# Patient Record
Sex: Male | Born: 1955
Health system: Southern US, Community
[De-identification: ages and names within clinical notes are randomized; demographics above are authoritative.]

## PROBLEM LIST (undated history)

## (undated) ENCOUNTER — Emergency Department (HOSPITAL_BASED_OUTPATIENT_CLINIC_OR_DEPARTMENT_OTHER): Admission: EM | Payer: BC Managed Care – PPO

## (undated) DIAGNOSIS — G4700 Insomnia, unspecified: Secondary | ICD-10-CM

## (undated) DIAGNOSIS — M25551 Pain in right hip: Secondary | ICD-10-CM

## (undated) DIAGNOSIS — R351 Nocturia: Secondary | ICD-10-CM

## (undated) DIAGNOSIS — E7800 Pure hypercholesterolemia, unspecified: Secondary | ICD-10-CM

## (undated) DIAGNOSIS — N400 Enlarged prostate without lower urinary tract symptoms: Secondary | ICD-10-CM

## (undated) DIAGNOSIS — F609 Personality disorder, unspecified: Secondary | ICD-10-CM

## (undated) DIAGNOSIS — M25519 Pain in unspecified shoulder: Secondary | ICD-10-CM

## (undated) DIAGNOSIS — I700 Atherosclerosis of aorta: Secondary | ICD-10-CM

## (undated) DIAGNOSIS — K760 Fatty (change of) liver, not elsewhere classified: Secondary | ICD-10-CM

## (undated) DIAGNOSIS — N281 Cyst of kidney, acquired: Secondary | ICD-10-CM

## (undated) DIAGNOSIS — F0390 Unspecified dementia without behavioral disturbance: Secondary | ICD-10-CM

## (undated) DIAGNOSIS — Z973 Presence of spectacles and contact lenses: Secondary | ICD-10-CM

## (undated) HISTORY — DX: Pure hypercholesterolemia, unspecified: E78.00

## (undated) HISTORY — DX: Cyst of kidney, acquired: N28.1

## (undated) HISTORY — DX: Pain in right hip: M25.551

## (undated) HISTORY — PX: SHOULDER SURGERY: SHX246

## (undated) HISTORY — DX: Personality disorder, unspecified: F60.9

## (undated) HISTORY — DX: Pain in unspecified shoulder: M25.519

## (undated) HISTORY — DX: Fatty (change of) liver, not elsewhere classified: K76.0

## (undated) HISTORY — DX: Insomnia, unspecified: G47.00

## (undated) HISTORY — PX: TONSILLECTOMY: SUR1361

## (undated) HISTORY — DX: Unspecified dementia, unspecified severity, without behavioral disturbance, psychotic disturbance, mood disturbance, and anxiety: F03.90

## (undated) HISTORY — DX: Atherosclerosis of aorta: I70.0

---

## 1999-09-08 ENCOUNTER — Ambulatory Visit (HOSPITAL_COMMUNITY): Admission: RE | Admit: 1999-09-08 | Discharge: 1999-09-08 | Payer: Self-pay | Admitting: Internal Medicine

## 1999-09-08 ENCOUNTER — Encounter: Payer: Self-pay | Admitting: Internal Medicine

## 2006-07-31 ENCOUNTER — Encounter: Admission: RE | Admit: 2006-07-31 | Discharge: 2006-07-31 | Payer: Self-pay | Admitting: Family Medicine

## 2010-04-10 ENCOUNTER — Observation Stay (HOSPITAL_COMMUNITY)
Admission: EM | Admit: 2010-04-10 | Discharge: 2010-04-12 | DRG: 248 | Disposition: A | Discharge status: Home / Self Care | Payer: BC Managed Care – PPO | Attending: Internal Medicine | Admitting: Internal Medicine

## 2010-04-10 ENCOUNTER — Emergency Department (HOSPITAL_COMMUNITY): Payer: BC Managed Care – PPO

## 2010-04-10 DIAGNOSIS — M19019 Primary osteoarthritis, unspecified shoulder: Secondary | ICD-10-CM | POA: Insufficient documentation

## 2010-04-10 DIAGNOSIS — M25519 Pain in unspecified shoulder: Secondary | ICD-10-CM | POA: Insufficient documentation

## 2010-04-10 DIAGNOSIS — R0602 Shortness of breath: Secondary | ICD-10-CM | POA: Insufficient documentation

## 2010-04-10 DIAGNOSIS — R0789 Other chest pain: Secondary | ICD-10-CM | POA: Diagnosis present

## 2010-04-10 DIAGNOSIS — R209 Unspecified disturbances of skin sensation: Secondary | ICD-10-CM | POA: Insufficient documentation

## 2010-04-10 DIAGNOSIS — M7530 Calcific tendinitis of unspecified shoulder: Principal | ICD-10-CM | POA: Diagnosis present

## 2010-04-10 DIAGNOSIS — M542 Cervicalgia: Secondary | ICD-10-CM | POA: Insufficient documentation

## 2010-04-10 DIAGNOSIS — E781 Pure hyperglyceridemia: Secondary | ICD-10-CM | POA: Diagnosis present

## 2010-04-10 DIAGNOSIS — R29898 Other symptoms and signs involving the musculoskeletal system: Secondary | ICD-10-CM | POA: Insufficient documentation

## 2010-04-10 DIAGNOSIS — M79609 Pain in unspecified limb: Secondary | ICD-10-CM | POA: Insufficient documentation

## 2010-04-10 LAB — CBC
HCT: 40.7 % (ref 39.0–52.0)
Hemoglobin: 14.1 g/dL (ref 13.0–17.0)
MCV: 87.5 fL (ref 78.0–100.0)
RBC: 4.65 MIL/uL (ref 4.22–5.81)
RDW: 13 % (ref 11.5–15.5)
WBC: 11 10*3/uL — ABNORMAL HIGH (ref 4.0–10.5)

## 2010-04-10 LAB — POCT I-STAT, CHEM 8
BUN: 10 mg/dL (ref 6–23)
Calcium, Ion: 1.13 mmol/L (ref 1.12–1.32)
Chloride: 104 mEq/L (ref 96–112)
Creatinine, Ser: 0.9 mg/dL (ref 0.4–1.5)
Glucose, Bld: 97 mg/dL (ref 70–99)
Potassium: 3.8 mEq/L (ref 3.5–5.1)

## 2010-04-10 LAB — POCT CARDIAC MARKERS
CKMB, poc: <1 ng/mL — ABNORMAL LOW (ref 1.0–8.0)
CKMB, poc: <1 ng/mL — ABNORMAL LOW (ref 1.0–8.0)
Troponin i, poc: <0.05 ng/mL (ref 0.00–0.09)
Troponin i, poc: <0.05 ng/mL (ref 0.00–0.09)

## 2010-04-10 LAB — DIFFERENTIAL
Eosinophils Relative: 2 % (ref 0–5)
Lymphocytes Relative: 33 % (ref 12–46)
Lymphs Abs: 3.6 10*3/uL (ref 0.7–4.0)
Neutro Abs: 6.3 10*3/uL (ref 1.7–7.7)

## 2010-04-11 ENCOUNTER — Inpatient Hospital Stay (HOSPITAL_COMMUNITY): Payer: BC Managed Care – PPO

## 2010-04-11 LAB — LIPID PANEL
Cholesterol: 158 mg/dL (ref 0–200)
LDL Cholesterol: 82 mg/dL (ref 0–99)
Triglycerides: 194 mg/dL — ABNORMAL HIGH (ref <150)
VLDL: 39 mg/dL (ref 0–40)

## 2010-04-11 LAB — CBC
HCT: 40.7 % (ref 39.0–52.0)
Hemoglobin: 13.8 g/dL (ref 13.0–17.0)
MCH: 30.1 pg (ref 26.0–34.0)
MCHC: 33.9 g/dL (ref 30.0–36.0)
RBC: 4.58 MIL/uL (ref 4.22–5.81)

## 2010-04-11 LAB — CARDIAC PANEL(CRET KIN+CKTOT+MB+TROPI)
CK, MB: 1.1 ng/mL (ref 0.3–4.0)
Relative Index: INVALID (ref 0.0–2.5)
Total CK: 77 U/L (ref 7–232)
Total CK: 65 U/L (ref 7–232)

## 2010-04-11 LAB — BASIC METABOLIC PANEL
CO2: 26 mEq/L (ref 19–32)
Calcium: 8.7 mg/dL (ref 8.4–10.5)
Chloride: 106 mEq/L (ref 96–112)
Creatinine, Ser: 0.79 mg/dL (ref 0.4–1.5)
GFR calc Af Amer: >60 mL/min (ref >60)
Glucose, Bld: 115 mg/dL — ABNORMAL HIGH (ref 70–99)
Sodium: 138 mEq/L (ref 135–145)

## 2010-04-12 ENCOUNTER — Inpatient Hospital Stay (HOSPITAL_COMMUNITY): Payer: 59

## 2010-04-12 ENCOUNTER — Inpatient Hospital Stay (HOSPITAL_COMMUNITY): Payer: BC Managed Care – PPO

## 2010-04-12 LAB — COMPREHENSIVE METABOLIC PANEL
ALT: 21 U/L (ref 0–53)
AST: 18 U/L (ref 0–37)
Albumin: 4.1 g/dL (ref 3.5–5.2)
Alkaline Phosphatase: 92 U/L (ref 39–117)
Calcium: 9 mg/dL (ref 8.4–10.5)
GFR calc Af Amer: >60 mL/min (ref >60)
Potassium: 4 mEq/L (ref 3.5–5.1)
Sodium: 135 mEq/L (ref 135–145)
Total Protein: 7.1 g/dL (ref 6.0–8.3)

## 2010-04-16 NOTE — Discharge Summary (Signed)
[name], [name]         ACCOUNT NO.:  [id number]  MEDICAL RECORD NO.:  [id number]           PATIENT TYPE:  I  LOCATION:  1430                         FACILITY:  [facility]  PHYSICIAN:  Hillery Aldo, M.D.   DATE OF BIRTH:  07-29-1955  DATE OF ADMISSION:  04/10/2010 DATE OF DISCHARGE:  04/12/2010                              DISCHARGE SUMMARY   PRIMARY CARE PHYSICIAN:  L. Lupe Carney, M.D.  DISCHARGE DIAGNOSES: 1. Left shoulder, forearm, and chest pain, musculoskeletal in origin. 2. Hypertriglyceridemia. 3. Calcific rotator cuff tendinopathy.  DISCHARGE MEDICATIONS: 1. Motrin 600 mg p.o. q.6 h. p.r.n. pain. 2. Oxycodone 5-10 mg p.o. q.4 h. p.r.n. pain. 3. Extra strength Tylenol 1000 mg p.o. q.6 h. p.r.n. pain. 4. Fish oil 2 g p.o. b.i.d.  CONSULTATIONS:  None.  BRIEF ADMISSION HISTORY OF PRESENT ILLNESS:  The patient is a 55 year old male who presented to the hospital with a 1-week history of left shoulder, forearm, and chest pain.  Pain began after experiencing a popping sensation in his axilla.  After the popping sensation, the patient reported pain and numbness in the left arm.  He went to see his primary care physician who started him on prednisone, but despite therapy with prednisone, the pain has not improved.  Because of concurrent complaints of left-sided chest pain, he was referred to the Hospitalist Service for further evaluation and treatment.  For full details, please see the dictated report done by Dr. Mikeal Hawthorne.  PROCEDURES AND DIAGNOSTIC STUDIES: 1. C-spine films on April 10, 2010, showed no acute osseous     abnormality.  Mild multilevel degenerative disk disease. 2. Chest x-ray on April 10, 2010, showed no focal acute     abnormality. 3. MRI of the left shoulder:  Normal.  Probable benign bone island in     the proximal left humeral shaft.  Radiographs of the left humerus     recommended for further evaluation. 4. Left humeral films on  April 12, 2010, showed no acute     abnormality.  Calcific rotator cuff tendinopathy.     Acromioclavicular osteoarthritis. 5. MRI of the brain on April 12, 2010, showed no acute or     reversible findings.  No specific cause of the patient's described     symptoms.  Mild small vessel change of the hemispheric white     matter. 6. MRI of the cervical spine on April 12, 2010, showed mild     multilevel spinal and foraminal stenosis.  DISCHARGE LABORATORY VALUES:  Cardiac markers were negative x3 sets. Sodium was 135, potassium 4.0, chloride 102, bicarb 29, BUN 14, creatinine 1.02, glucose 122, calcium 9.0.  Liver function studies were within normal limits.  TSH was 1.608.  Lipids showed a cholesterol of 158, triglycerides 194, HDL 37, LDL 82.  HOSPITAL COURSE BY PROBLEM: 1. The patient was admitted and cardiac markers were cycled q.8 h. x3     sets.  These were completely negative.  No events were noted on     telemetry and the patient's 12-lead EKG tracing was essentially     normal.  The patient's pain was markedly atypical and certainly  consistent with a musculoskeletal origin and a full diagnostic     evaluation only revealed calcific rotator cuff tendinopathy as the     likely underlying etiology.  The patient will be discharged on     oxycodone and Motrin for pain control and has been advised to call     [facility] in the morning to arrange for an orthopedic     evaluation as an outpatient. 2. Hypertriglyceridemia:  The patient was put on fish oil supplements.  DISPOSITION:  The patient is medically stable and will be discharged home.  CONDITION AT DISCHARGE:  Stable.  Time spent coordinating care for discharge and discharge instructions including face-to-face time equals 25 minutes.     Hillery Aldo, M.D.     CR/MEDQ  D:  04/12/2010  T:  04/12/2010  Job:  [id number]  cc:   L. Lupe Carney, M.D. Fax: [phone]  Electronically Signed by  Hillery Aldo M.D. on 04/16/2010 03:12:16 PM

## 2010-04-29 NOTE — H&P (Signed)
NAMEWISSAM, [name]         ACCOUNT NO.:  [id number]  MEDICAL RECORD NO.:  [id number]           PATIENT TYPE:  E  LOCATION:  WLED                         FACILITY:  [facility]  PHYSICIAN:  Lonia Blood, M.D.      DATE OF BIRTH:  Oct 22, 1955  DATE OF ADMISSION:  04/10/2010 DATE OF DISCHARGE:                             HISTORY & PHYSICAL   PRIMARY CARE PHYSICIAN:  Melba Coon, MD  PRESENTING COMPLAINT:  Left-sided chest and arm pain.  HISTORY OF PRESENT ILLNESS:  The patient is a 55 year old African American male with no significant past medical history, who apparently has been having problem for about a week.  It started with the patient feeling the pain or rather having a sensation of popping something in his left arm.  This is directly under his armpit.  This started about a week ago.  He continued to have pain and numbness on the left arm.  He went to see Dr. Clovis Riley who started him on some prednisone, but the pain has not gotten any better.  He continued to have also left-sided chest pain rated at 7 to 8 out of 10.  He had some nausea associated with it.  He denied any specific neck pain.  Denied any shortness of breath.  His pain is worsened by movement, laying down, or raising his arm.  It is only relieved by rest slightly.  So far in the ED, he has felt a little bit better with IV pain medication.  The patient has known significant risk factors for coronary artery disease except for being a male and apparently slightly elevated cholesterol levels during his last physicals.  PAST MEDICAL HISTORY:  None.  ALLERGIES:  No known drug allergies.  MEDICATIONS:  None.  SOCIAL HISTORY:  The patient lives in [place].  He is married.  He denied any tobacco or IV drug use.  He drinks socially.  FAMILY HISTORY:  Denied any family history of coronary artery disease, hypertension, or diabetes.  REVIEW OF SYSTEMS:  All systems reviewed are otherwise negative except per  HPI.  PHYSICAL EXAMINATION:  VITAL SIGNS:  Temperature 97.9, blood pressure 134/88, pulse 78, respiratory 20, sat is 100% on room air. GENERAL:  He is awake, alert, oriented.  He is in no acute distress. HEENT:  PERRL.  EOMI.  No pallor.  No jaundice.  No rhinorrhea. NECK:  Supple.  No JVD.  No lymphadenopathy. RESPIRATORY:  He has good air entry bilaterally.  No wheezes.  No rales. No crackles. CARDIOVASCULAR SYSTEM.  He has S1 and S2.  No murmur. ABDOMEN:  Soft, full, nontender with positive bowel sounds. EXTREMITIES:  No edema, cyanosis, or clubbing.  The patient's upper extremity exam showed limitation of movement with the right upper extremity.  He is extremely tender in the axillary area, also around the left shoulder in general.  No other joint findings. SKIN:  No rashes or ulcers.  LABORATORY DATA:  White count is 11.0 with normal differentials, hemoglobin 14.1, platelet 261.  Sodium is 139, potassium 3.8, chloride 104, BUN 10, creatinine 0.9, glucose 97.  Initial cardiac enzymes are negative.  EKG is nonfocal.  Chest  x-ray showed no focal abnormalities. Cervical spine x-ray showed mild multilevel disk degenerative change, but no acute findings.  ASSESSMENT:  This is a 55 year old gentleman presenting with left arm and chest pain.  His pain seems to be more musculoskeletal than anything.  There is some element of possible radiculopathy and the patient may have injured his brachial plexus or rather axillary nerve. He may also have pulled a muscle in the area.  He complained of chest pain in addition, which I believe is probably a referred pain.  However, being a male and with reported high cholesterol, he has some mild-to- moderate risk factors also for coronary artery disease.  PLAN:  At this point will be, 1. Chest pain.  We will admit the patient for observation.  Check     serial cardiac enzymes.  If they are all negative, probably the     patient will not need any  further workup.  He has had a stress test     apparently a couple of years ago that was negative, although per     the patient actually he had a stress test in the 90s. 2. Left arm pain.  His pain is so reproducible with pressure over his     left shoulder and axillary area.  I therefore suspect that the     patient is having more musculoskeletal pain.  At this point, we     will check MRI of the left shoulder area to see if maybe it can     show some elements of muscle or joint damage with referred pain.     The patient may also ultimately require cervical spine MRI to rule     out radiculopathy from the nerve root over there.  For now,     however, I will treat him with pain control and probably get PT/OT     down the road.     Lonia Blood, M.D.     Verlin Grills  D:  04/10/2010  T:  04/10/2010  Job:  [id number]  Electronically Signed by Lonia Blood M.D. on 04/28/2010 04:21:43 PM

## 2015-08-03 DIAGNOSIS — N401 Enlarged prostate with lower urinary tract symptoms: Secondary | ICD-10-CM | POA: Diagnosis not present

## 2015-08-03 DIAGNOSIS — R351 Nocturia: Secondary | ICD-10-CM | POA: Diagnosis not present

## 2015-09-07 DIAGNOSIS — Z202 Contact with and (suspected) exposure to infections with a predominantly sexual mode of transmission: Secondary | ICD-10-CM | POA: Diagnosis not present

## 2015-09-07 DIAGNOSIS — Z113 Encounter for screening for infections with a predominantly sexual mode of transmission: Secondary | ICD-10-CM | POA: Diagnosis not present

## 2015-10-02 DIAGNOSIS — N401 Enlarged prostate with lower urinary tract symptoms: Secondary | ICD-10-CM | POA: Diagnosis not present

## 2015-10-02 DIAGNOSIS — R351 Nocturia: Secondary | ICD-10-CM | POA: Diagnosis not present

## 2015-10-21 DIAGNOSIS — E7800 Pure hypercholesterolemia, unspecified: Secondary | ICD-10-CM | POA: Diagnosis not present

## 2015-10-21 DIAGNOSIS — Z125 Encounter for screening for malignant neoplasm of prostate: Secondary | ICD-10-CM | POA: Diagnosis not present

## 2015-10-21 DIAGNOSIS — Z0000 Encounter for general adult medical examination without abnormal findings: Secondary | ICD-10-CM | POA: Diagnosis not present

## 2016-08-18 DIAGNOSIS — N401 Enlarged prostate with lower urinary tract symptoms: Secondary | ICD-10-CM | POA: Diagnosis not present

## 2016-08-18 DIAGNOSIS — R3915 Urgency of urination: Secondary | ICD-10-CM | POA: Diagnosis not present

## 2016-08-18 DIAGNOSIS — R351 Nocturia: Secondary | ICD-10-CM | POA: Diagnosis not present

## 2016-10-21 DIAGNOSIS — Z0000 Encounter for general adult medical examination without abnormal findings: Secondary | ICD-10-CM | POA: Diagnosis not present

## 2016-10-21 DIAGNOSIS — E7800 Pure hypercholesterolemia, unspecified: Secondary | ICD-10-CM | POA: Diagnosis not present

## 2016-12-22 DIAGNOSIS — N401 Enlarged prostate with lower urinary tract symptoms: Secondary | ICD-10-CM | POA: Diagnosis not present

## 2017-03-10 DIAGNOSIS — R972 Elevated prostate specific antigen [PSA]: Secondary | ICD-10-CM | POA: Diagnosis not present

## 2017-03-10 DIAGNOSIS — R351 Nocturia: Secondary | ICD-10-CM | POA: Diagnosis not present

## 2017-03-10 DIAGNOSIS — N5201 Erectile dysfunction due to arterial insufficiency: Secondary | ICD-10-CM | POA: Diagnosis not present

## 2017-03-10 DIAGNOSIS — N401 Enlarged prostate with lower urinary tract symptoms: Secondary | ICD-10-CM | POA: Diagnosis not present

## 2017-04-11 DIAGNOSIS — R972 Elevated prostate specific antigen [PSA]: Secondary | ICD-10-CM | POA: Diagnosis not present

## 2017-04-21 DIAGNOSIS — N401 Enlarged prostate with lower urinary tract symptoms: Secondary | ICD-10-CM | POA: Diagnosis not present

## 2017-04-21 DIAGNOSIS — R972 Elevated prostate specific antigen [PSA]: Secondary | ICD-10-CM | POA: Diagnosis not present

## 2017-04-21 DIAGNOSIS — R351 Nocturia: Secondary | ICD-10-CM | POA: Diagnosis not present

## 2017-10-26 DIAGNOSIS — Z23 Encounter for immunization: Secondary | ICD-10-CM | POA: Diagnosis not present

## 2017-10-26 DIAGNOSIS — Z0000 Encounter for general adult medical examination without abnormal findings: Secondary | ICD-10-CM | POA: Diagnosis not present

## 2017-10-26 DIAGNOSIS — E7800 Pure hypercholesterolemia, unspecified: Secondary | ICD-10-CM | POA: Diagnosis not present

## 2017-11-10 DIAGNOSIS — N401 Enlarged prostate with lower urinary tract symptoms: Secondary | ICD-10-CM | POA: Diagnosis not present

## 2017-11-10 DIAGNOSIS — R351 Nocturia: Secondary | ICD-10-CM | POA: Diagnosis not present

## 2017-11-17 DIAGNOSIS — N401 Enlarged prostate with lower urinary tract symptoms: Secondary | ICD-10-CM | POA: Diagnosis not present

## 2017-11-17 DIAGNOSIS — R972 Elevated prostate specific antigen [PSA]: Secondary | ICD-10-CM | POA: Diagnosis not present

## 2017-11-17 DIAGNOSIS — N5201 Erectile dysfunction due to arterial insufficiency: Secondary | ICD-10-CM | POA: Diagnosis not present

## 2017-11-17 DIAGNOSIS — R351 Nocturia: Secondary | ICD-10-CM | POA: Diagnosis not present

## 2017-12-29 DIAGNOSIS — R351 Nocturia: Secondary | ICD-10-CM | POA: Diagnosis not present

## 2017-12-29 DIAGNOSIS — N401 Enlarged prostate with lower urinary tract symptoms: Secondary | ICD-10-CM | POA: Diagnosis not present

## 2018-01-29 ENCOUNTER — Other Ambulatory Visit: Payer: Self-pay | Admitting: Urology

## 2018-01-31 ENCOUNTER — Encounter (HOSPITAL_BASED_OUTPATIENT_CLINIC_OR_DEPARTMENT_OTHER): Payer: Self-pay | Admitting: *Deleted

## 2018-01-31 ENCOUNTER — Other Ambulatory Visit: Payer: Self-pay

## 2018-01-31 NOTE — Progress Notes (Signed)
Spoke w/ pt via phone for pre-op interview.  Npo after mn.  Arrive at 0900.

## 2018-02-05 ENCOUNTER — Other Ambulatory Visit: Payer: Self-pay

## 2018-02-05 ENCOUNTER — Ambulatory Visit (HOSPITAL_BASED_OUTPATIENT_CLINIC_OR_DEPARTMENT_OTHER): Payer: 59 | Admitting: Anesthesiology

## 2018-02-05 ENCOUNTER — Encounter (HOSPITAL_COMMUNITY)
Admission: RE | Disposition: A | Discharge status: Home / Self Care | Payer: Self-pay | Source: Home / Self Care | Attending: Urology

## 2018-02-05 ENCOUNTER — Encounter (HOSPITAL_BASED_OUTPATIENT_CLINIC_OR_DEPARTMENT_OTHER): Payer: Self-pay

## 2018-02-05 ENCOUNTER — Observation Stay (HOSPITAL_BASED_OUTPATIENT_CLINIC_OR_DEPARTMENT_OTHER)
Admission: RE | Admit: 2018-02-05 | Discharge: 2018-02-06 | Disposition: A | Discharge status: Home / Self Care | Payer: 59 | Attending: Urology | Admitting: Urology

## 2018-02-05 DIAGNOSIS — N401 Enlarged prostate with lower urinary tract symptoms: Principal | ICD-10-CM | POA: Insufficient documentation

## 2018-02-05 DIAGNOSIS — R351 Nocturia: Secondary | ICD-10-CM | POA: Insufficient documentation

## 2018-02-05 DIAGNOSIS — Z79899 Other long term (current) drug therapy: Secondary | ICD-10-CM | POA: Insufficient documentation

## 2018-02-05 DIAGNOSIS — N400 Enlarged prostate without lower urinary tract symptoms: Secondary | ICD-10-CM

## 2018-02-05 HISTORY — DX: Benign prostatic hyperplasia without lower urinary tract symptoms: N40.0

## 2018-02-05 HISTORY — PX: TRANSURETHRAL RESECTION OF PROSTATE: SHX73

## 2018-02-05 HISTORY — DX: Nocturia: R35.1

## 2018-02-05 HISTORY — DX: Presence of spectacles and contact lenses: Z97.3

## 2018-02-05 LAB — BASIC METABOLIC PANEL
ANION GAP: 8 (ref 5–15)
BUN: 12 mg/dL (ref 8–23)
CO2: 23 mmol/L (ref 22–32)
Calcium: 8.1 mg/dL — ABNORMAL LOW (ref 8.9–10.3)
Chloride: 110 mmol/L (ref 98–111)
Creatinine, Ser: 0.85 mg/dL (ref 0.61–1.24)
GFR calc Af Amer: >60 mL/min (ref >60)
GFR calc non Af Amer: >60 mL/min (ref >60)
Glucose, Bld: 94 mg/dL (ref 70–99)
Potassium: 4.1 mmol/L (ref 3.5–5.1)
Sodium: 141 mmol/L (ref 135–145)

## 2018-02-05 LAB — CBC
HCT: 39.4 % (ref 39.0–52.0)
HEMOGLOBIN: 12.8 g/dL — AB (ref 13.0–17.0)
MCH: 30 pg (ref 26.0–34.0)
MCHC: 32.5 g/dL (ref 30.0–36.0)
MCV: 92.3 fL (ref 80.0–100.0)
Platelets: 260 10*3/uL (ref 150–400)
RBC: 4.27 MIL/uL (ref 4.22–5.81)
RDW: 13 % (ref 11.5–15.5)
WBC: 11.6 10*3/uL — ABNORMAL HIGH (ref 4.0–10.5)
nRBC: 0 % (ref 0.0–0.2)

## 2018-02-05 SURGERY — TURP (TRANSURETHRAL RESECTION OF PROSTATE)
Anesthesia: General | Site: Prostate

## 2018-02-05 MED ORDER — EPHEDRINE SULFATE-NACL 50-0.9 MG/10ML-% IV SOSY
PREFILLED_SYRINGE | INTRAVENOUS | Status: DC | PRN
  Administered 2018-02-05 (×4): 5 mg via INTRAVENOUS

## 2018-02-05 MED ORDER — SODIUM CHLORIDE 0.9 % IV SOLN
2.0000 g | INTRAVENOUS | Status: AC
  Administered 2018-02-05: 2 g via INTRAVENOUS
  Filled 2018-02-05: qty 20

## 2018-02-05 MED ORDER — FENTANYL CITRATE (PF) 100 MCG/2ML IJ SOLN
INTRAMUSCULAR | Status: DC | PRN
  Administered 2018-02-05 (×2): 25 ug via INTRAVENOUS

## 2018-02-05 MED ORDER — LACTATED RINGERS IV SOLN
INTRAVENOUS | Status: DC | PRN
  Administered 2018-02-05: 12:00:00 via INTRAVENOUS

## 2018-02-05 MED ORDER — DEXAMETHASONE SODIUM PHOSPHATE 10 MG/ML IJ SOLN
INTRAMUSCULAR | Status: DC | PRN
  Administered 2018-02-05: 8 mg via INTRAVENOUS

## 2018-02-05 MED ORDER — PROMETHAZINE HCL 25 MG/ML IJ SOLN
6.2500 mg | INTRAMUSCULAR | Status: DC | PRN
  Filled 2018-02-05: qty 1

## 2018-02-05 MED ORDER — SODIUM CHLORIDE 0.9 % IR SOLN
3000.0000 mL | Status: DC
  Filled 2018-02-05: qty 3000

## 2018-02-05 MED ORDER — LIDOCAINE 2% (20 MG/ML) 5 ML SYRINGE
INTRAMUSCULAR | Status: AC
  Filled 2018-02-05: qty 5

## 2018-02-05 MED ORDER — OXYCODONE HCL 5 MG PO TABS
5.0000 mg | ORAL_TABLET | Freq: Once | ORAL | Status: DC | PRN
  Filled 2018-02-05: qty 1

## 2018-02-05 MED ORDER — MIDAZOLAM HCL 2 MG/2ML IJ SOLN
INTRAMUSCULAR | Status: AC
  Filled 2018-02-05: qty 2

## 2018-02-05 MED ORDER — HYDROMORPHONE HCL 1 MG/ML IJ SOLN
INTRAMUSCULAR | Status: AC
  Filled 2018-02-05: qty 1

## 2018-02-05 MED ORDER — PHENYLEPHRINE 40 MCG/ML (10ML) SYRINGE FOR IV PUSH (FOR BLOOD PRESSURE SUPPORT)
PREFILLED_SYRINGE | INTRAVENOUS | Status: AC
  Filled 2018-02-05: qty 10

## 2018-02-05 MED ORDER — ONDANSETRON HCL 4 MG/2ML IJ SOLN
4.0000 mg | INTRAMUSCULAR | Status: DC | PRN
  Filled 2018-02-05: qty 2

## 2018-02-05 MED ORDER — FENTANYL CITRATE (PF) 100 MCG/2ML IJ SOLN
INTRAMUSCULAR | Status: AC
  Filled 2018-02-05: qty 2

## 2018-02-05 MED ORDER — PHENYLEPHRINE 40 MCG/ML (10ML) SYRINGE FOR IV PUSH (FOR BLOOD PRESSURE SUPPORT)
PREFILLED_SYRINGE | INTRAVENOUS | Status: DC | PRN
  Administered 2018-02-05 (×2): 80 ug via INTRAVENOUS
  Administered 2018-02-05: 40 ug via INTRAVENOUS
  Administered 2018-02-05: 120 ug via INTRAVENOUS
  Administered 2018-02-05: 40 ug via INTRAVENOUS

## 2018-02-05 MED ORDER — BELLADONNA ALKALOIDS-OPIUM 16.2-60 MG RE SUPP
1.0000 | Freq: Four times a day (QID) | RECTAL | Status: DC | PRN
  Administered 2018-02-06: 1 via RECTAL
  Filled 2018-02-05 (×2): qty 1

## 2018-02-05 MED ORDER — SODIUM CHLORIDE 0.9 % IV SOLN
INTRAVENOUS | Status: AC
  Filled 2018-02-05: qty 100

## 2018-02-05 MED ORDER — PROPOFOL 10 MG/ML IV BOLUS
INTRAVENOUS | Status: DC | PRN
  Administered 2018-02-05: 170 mg via INTRAVENOUS

## 2018-02-05 MED ORDER — OXYCODONE-ACETAMINOPHEN 5-325 MG PO TABS: 1.0000 | ORAL_TABLET | ORAL | 0 refills | Status: AC | PRN

## 2018-02-05 MED ORDER — OXYCODONE HCL 5 MG/5ML PO SOLN
5.0000 mg | Freq: Once | ORAL | Status: DC | PRN
  Filled 2018-02-05: qty 5

## 2018-02-05 MED ORDER — EPHEDRINE 5 MG/ML INJ
INTRAVENOUS | Status: AC
  Filled 2018-02-05: qty 10

## 2018-02-05 MED ORDER — ONDANSETRON HCL 4 MG/2ML IJ SOLN
INTRAMUSCULAR | Status: DC | PRN
  Administered 2018-02-05: 4 mg via INTRAVENOUS

## 2018-02-05 MED ORDER — ZOLPIDEM TARTRATE 5 MG PO TABS
5.0000 mg | ORAL_TABLET | Freq: Every evening | ORAL | Status: DC | PRN
  Administered 2018-02-05: 5 mg via ORAL
  Filled 2018-02-05 (×2): qty 1

## 2018-02-05 MED ORDER — DEXAMETHASONE SODIUM PHOSPHATE 10 MG/ML IJ SOLN
INTRAMUSCULAR | Status: AC
  Filled 2018-02-05: qty 1

## 2018-02-05 MED ORDER — SODIUM CHLORIDE 0.9 % IV SOLN
INTRAVENOUS | Status: DC
  Administered 2018-02-05: 1000 mL via INTRAVENOUS
  Filled 2018-02-05: qty 1000

## 2018-02-05 MED ORDER — FINASTERIDE 5 MG PO TABS
5.0000 mg | ORAL_TABLET | Freq: Every day | ORAL | Status: DC
  Administered 2018-02-05: 5 mg via ORAL
  Filled 2018-02-05 (×2): qty 1

## 2018-02-05 MED ORDER — DIPHENHYDRAMINE HCL 50 MG/ML IJ SOLN
12.5000 mg | Freq: Four times a day (QID) | INTRAMUSCULAR | Status: DC | PRN
  Filled 2018-02-05: qty 0.5

## 2018-02-05 MED ORDER — SODIUM CHLORIDE 0.9 % IV SOLN
INTRAVENOUS | Status: DC
  Filled 2018-02-05: qty 1000

## 2018-02-05 MED ORDER — DIPHENHYDRAMINE HCL 12.5 MG/5ML PO ELIX
12.5000 mg | ORAL_SOLUTION | Freq: Four times a day (QID) | ORAL | Status: DC | PRN
  Filled 2018-02-05: qty 10

## 2018-02-05 MED ORDER — LIDOCAINE HCL (CARDIAC) PF 100 MG/5ML IV SOSY
PREFILLED_SYRINGE | INTRAVENOUS | Status: DC | PRN
  Administered 2018-02-05: 100 mg via INTRAVENOUS

## 2018-02-05 MED ORDER — ACETAMINOPHEN 325 MG PO TABS: 650.0000 mg | ORAL_TABLET | ORAL | Status: DC | PRN

## 2018-02-05 MED ORDER — HYDROMORPHONE HCL 1 MG/ML IJ SOLN
0.5000 mg | INTRAMUSCULAR | Status: DC | PRN
  Filled 2018-02-05: qty 1

## 2018-02-05 MED ORDER — OXYCODONE-ACETAMINOPHEN 5-325 MG PO TABS
1.0000 | ORAL_TABLET | ORAL | Status: DC | PRN
  Administered 2018-02-05: 1 via ORAL
  Filled 2018-02-05: qty 2
  Filled 2018-02-05: qty 1

## 2018-02-05 MED ORDER — HYDROMORPHONE HCL 1 MG/ML IJ SOLN
0.2500 mg | INTRAMUSCULAR | Status: DC | PRN
  Administered 2018-02-05 (×2): 0.25 mg via INTRAVENOUS
  Filled 2018-02-05: qty 0.5

## 2018-02-05 MED ORDER — SODIUM CHLORIDE 0.9 % IR SOLN
Status: DC | PRN
  Administered 2018-02-05: 21000 mL

## 2018-02-05 MED ORDER — CEFTRIAXONE SODIUM 2 G IJ SOLR
INTRAMUSCULAR | Status: AC
  Filled 2018-02-05: qty 20

## 2018-02-05 MED ORDER — MIDAZOLAM HCL 2 MG/2ML IJ SOLN
INTRAMUSCULAR | Status: DC | PRN
  Administered 2018-02-05: 2 mg via INTRAVENOUS

## 2018-02-05 MED ORDER — ONDANSETRON HCL 4 MG/2ML IJ SOLN
INTRAMUSCULAR | Status: AC
  Filled 2018-02-05: qty 2

## 2018-02-05 MED ORDER — PROPOFOL 10 MG/ML IV BOLUS
INTRAVENOUS | Status: AC
  Filled 2018-02-05: qty 20

## 2018-02-05 SURGICAL SUPPLY — 26 items
BAG DRAIN URO-CYSTO SKYTR STRL (DRAIN) ×2 IMPLANT
BAG DRN ANRFLXCHMBR STRAP LEK (BAG)
BAG DRN UROCATH (DRAIN) ×1
BAG URINE DRAINAGE (UROLOGICAL SUPPLIES) ×2 IMPLANT
BAG URINE LEG 19OZ MD ST LTX (BAG) IMPLANT
CATH FOLEY 3WAY 30CC 22F (CATHETERS) ×2 IMPLANT
CLOTH BEACON ORANGE TIMEOUT ST (SAFETY) ×2 IMPLANT
ELECT REM PT RETURN 9FT ADLT (ELECTROSURGICAL)
ELECTRODE REM PT RTRN 9FT ADLT (ELECTROSURGICAL) IMPLANT
GLOVE BIO SURGEON STRL SZ8 (GLOVE) ×2 IMPLANT
GLOVE BIOGEL PI IND STRL 6.5 (GLOVE) IMPLANT
GLOVE BIOGEL PI INDICATOR 6.5 (GLOVE) ×1
GLOVE SURG SS PI 6.5 STRL IVOR (GLOVE) ×1 IMPLANT
GOWN STRL REUS W/TWL LRG LVL3 (GOWN DISPOSABLE) ×1 IMPLANT
GOWN STRL REUS W/TWL XL LVL3 (GOWN DISPOSABLE) ×2 IMPLANT
HOLDER FOLEY CATH W/STRAP (MISCELLANEOUS) ×2 IMPLANT
IV NS IRRIG 3000ML ARTHROMATIC (IV SOLUTION) ×7 IMPLANT
KIT TURNOVER CYSTO (KITS) ×2 IMPLANT
LOOP CUT BIPOLAR 24F LRG (ELECTROSURGICAL) ×1 IMPLANT
MANIFOLD NEPTUNE II (INSTRUMENTS) ×2 IMPLANT
PACK CYSTO (CUSTOM PROCEDURE TRAY) ×2 IMPLANT
PLUG CATH AND CAP STER (CATHETERS) ×2 IMPLANT
SYR 30ML LL (SYRINGE) ×2 IMPLANT
SYRINGE IRR TOOMEY STRL 70CC (SYRINGE) IMPLANT
TUBE CONNECTING 12X1/4 (SUCTIONS) ×1 IMPLANT
TUBING UROLOGY SET (TUBING) ×2 IMPLANT

## 2018-02-05 NOTE — Anesthesia Procedure Notes (Signed)
Procedure Name: LMA Insertion Date/Time: 02/05/2018 11:25 AM Performed by: Yolonda Kidaarver, Tasneem Cormier L, CRNA Pre-anesthesia Checklist: Emergency Drugs available, Patient identified, Suction available and Patient being monitored Patient Re-evaluated:Patient Re-evaluated prior to induction Oxygen Delivery Method: Circle system utilized Preoxygenation: Pre-oxygenation with 100% oxygen Induction Type: IV induction LMA: LMA inserted LMA Size: 4.0 Number of attempts: 1 Placement Confirmation: positive ETCO2,  CO2 detector and breath sounds checked- equal and bilateral Tube secured with: Tape Dental Injury: Teeth and Oropharynx as per pre-operative assessment

## 2018-02-05 NOTE — H&P (Signed)
Urology Admission H&P  Chief Complaint: nocturia  History of Present Illness: Mr [name] is 62yop with a hx of BPH who has failed medical therapy. He is currently on flomax BID, finasteride and mirabegron with continued urgency, frequency and nocturia. He denies dysuria or hematuria. No fevers/chills/sweats  Past Medical History:  Diagnosis Date  . BPH (benign prostatic hyperplasia)   . Nocturia more than twice per night   . Wears glasses    Past Surgical History:  Procedure Laterality Date  . TONSILLECTOMY  age 89    Home Medications:  Current Facility-Administered Medications  Medication Dose Route Frequency Provider Last Rate Last Dose  . 0.9 %  sodium chloride infusion   Intravenous Continuous Ellender, Catheryn Baconyan P, MD 50 mL/hr at 02/05/18 0952 1,000 mL at 02/05/18 0952  . cefTRIAXone (ROCEPHIN) 2 g in sodium chloride 0.9 % 100 mL IVPB  2 g Intravenous 30 min Pre-Op Jaquanda Wickersham, Mardene CelestePatrick L, MD       Allergies: No Known Allergies  History reviewed. No pertinent family history. Social History:  reports that he has never smoked. He has never used smokeless tobacco. He reports current alcohol use. He reports that he does not use drugs.  Review of Systems  Genitourinary: Positive for frequency and urgency.  All other systems reviewed and are negative.   Physical Exam:  Vital signs in last 24 hours: Temp:  [98.7 F (37.1 C)] 98.7 F (37.1 C) (12/16 0900) Pulse Rate:  [77] 77 (12/16 0900) Resp:  [16] 16 (12/16 0900) BP: (149)/(80) 149/80 (12/16 0900) SpO2:  [100 %] 100 % (12/16 0900) Weight:  [67.3 kg] 67.3 kg (12/16 0900) Physical Exam  Constitutional: He is oriented to person, place, and time. He appears well-developed and well-nourished.  HENT:  Head: Normocephalic and atraumatic.  Eyes: Pupils are equal, round, and reactive to light. EOM are normal.  Neck: Normal range of motion. No thyromegaly present.  Cardiovascular: Normal rate and regular rhythm.  Respiratory: Effort  normal. No respiratory distress.  GI: Soft. He exhibits no distension.  Musculoskeletal: Normal range of motion.        General: No edema.  Neurological: He is alert and oriented to person, place, and time.  Skin: Skin is warm and dry.  Psychiatric: He has a normal mood and affect. His behavior is normal. Judgment and thought content normal.    Laboratory Data:  No results found for this or any previous visit (from the past 24 hour(s)). No results found for this or any previous visit (from the past 240 hour(s)). Creatinine: No results for input(s): CREATININE in the last 168 hours. Baseline Creatinine: unknown  Impression/Assessment:  62yo with BPH with LUTS, nocturia  Plan:  The risks/benefits/alternatives to TURP was explained to the patient and he understands and wishes to proceed with surgery  Wilkie AyePatrick Mylei Brackeen 02/05/2018, 11:10 AM

## 2018-02-05 NOTE — Transfer of Care (Signed)
Immediate Anesthesia Transfer of Care Note  Patient: [name]  Procedure(s) Performed: TRANSURETHRAL RESECTION OF THE PROSTATE (TURP) (N/A Prostate)  Patient Location: PACU  Anesthesia Type:General  Level of Consciousness: awake and drowsy  Airway & Oxygen Therapy: Patient Spontanous Breathing and Patient connected to nasal cannula oxygen  Post-op Assessment: Report given to RN and Post -op Vital signs reviewed and stable  Post vital signs: Reviewed and stable  Last Vitals:  Vitals Value Taken Time  BP 130/74 02/05/2018 12:36 PM  Temp    Pulse 89 02/05/2018 12:37 PM  Resp 10 02/05/2018 12:37 PM  SpO2 97 % 02/05/2018 12:37 PM  Vitals shown include unvalidated device data.  Last Pain:  Vitals:   02/05/18 0936  TempSrc:   PainSc: 0-No pain      Patients Stated Pain Goal: 4 (02/05/18 0936)  Complications: No apparent anesthesia complications

## 2018-02-05 NOTE — Anesthesia Postprocedure Evaluation (Signed)
Anesthesia Post Note  Patient: [name]  Procedure(s) Performed: TRANSURETHRAL RESECTION OF THE PROSTATE (TURP) (N/A Prostate)     Patient location during evaluation: PACU Anesthesia Type: General Level of consciousness: awake and alert Pain management: pain level controlled Vital Signs Assessment: post-procedure vital signs reviewed and stable Respiratory status: spontaneous breathing, nonlabored ventilation, respiratory function stable and patient connected to nasal cannula oxygen Cardiovascular status: blood pressure returned to baseline and stable Postop Assessment: no apparent nausea or vomiting Anesthetic complications: no    Last Vitals:  Vitals:   02/05/18 1330 02/05/18 1345  BP: 114/76   Pulse: 69   Resp: 10 11  Temp:    SpO2: 100%     Last Pain:  Vitals:   02/05/18 1330  TempSrc:   PainSc: 0-No pain                 Aarin Bluett P Caelen Reierson

## 2018-02-05 NOTE — Anesthesia Preprocedure Evaluation (Addendum)
Anesthesia Evaluation  Patient identified by MRN, date of birth, ID band Patient awake    Reviewed: Allergy & Precautions, NPO status , Patient's Chart, lab work & pertinent test results  Airway Mallampati: II  TM Distance: >3 FB Neck ROM: Full    Dental  (+) Missing,    Pulmonary neg pulmonary ROS,    Pulmonary exam normal breath sounds clear to auscultation       Cardiovascular negative cardio ROS Normal cardiovascular exam Rhythm:Regular Rate:Normal     Neuro/Psych negative neurological ROS  negative psych ROS   GI/Hepatic negative GI ROS, Neg liver ROS,   Endo/Other  negative endocrine ROS  Renal/GU negative Renal ROS     Musculoskeletal negative musculoskeletal ROS (+)   Abdominal   Peds  Hematology negative hematology ROS (+)   Anesthesia Other Findings BENIGN PROSTATIC HYPERPLASIA  Reproductive/Obstetrics                            Anesthesia Physical Anesthesia Plan  ASA: I  Anesthesia Plan: General   Post-op Pain Management:    Induction: Intravenous  PONV Risk Score and Plan: 2 and Ondansetron, Dexamethasone, Midazolam and Treatment may vary due to age or medical condition  Airway Management Planned: LMA  Additional Equipment:   Intra-op Plan:   Post-operative Plan: Extubation in OR  Informed Consent: I have reviewed the patients History and Physical, chart, labs and discussed the procedure including the risks, benefits and alternatives for the proposed anesthesia with the patient or authorized representative who has indicated his/her understanding and acceptance.   Dental advisory given  Plan Discussed with: CRNA  Anesthesia Plan Comments:         Anesthesia Quick Evaluation

## 2018-02-05 NOTE — Discharge Instructions (Signed)
Transurethral Resection of the Prostate, Care After °Refer to this sheet in the next few weeks. These instructions provide you with information about caring for yourself after your procedure. Your health care provider may also give you more specific instructions. Your treatment has been planned according to current medical practices, but problems sometimes occur. Call your health care provider if you have any problems or questions after your procedure. °What can I expect after the procedure? °After the procedure, it is common to have: °· Mild pain in your lower abdomen. °· Soreness or mild discomfort in your penis from having the catheter inserted during the procedure. °· A feeling of urgency when you need to urinate. °· A small amount of blood in your urine. You may notice some small blood clots in your urine. These are normal. ° °Follow these instructions at home: °Medicines ° °· Take over-the-counter and prescription medicines only as told by your health care provider. °· Do not drive or operate heavy machinery while taking prescription pain medicine. °· Do not drive for 24 hours if you received a sedative. °· If you were prescribed antibiotic medicine, take it as told by your health care provider. Do not stop taking the antibiotic even if you start to feel better. °Activity °· Return to your normal activities as told by your health care provider. Ask your health care provider what activities are safe for you. °· Do not lift anything that is heavier than 10 lb (4.5 kg) for 3 weeks after your procedure, or as long as told by your health care provider. °· Avoid intense physical activity for as long as told by your health care provider. °· Walk at least one time every day. This helps to prevent blood clots. You may increase your physical activity gradually as you start to feel better. °Lifestyle °· Do not drink alcohol for as long as told by your health care provider. This is especially important if you are taking  prescription pain medicines. °· Do not engage in sexual activity until your health care provider says that you can do this. °General instructions °· Do not take baths, swim, or use a hot tub until your health care provider approves. °· Drink enough fluid to keep your urine clear or pale yellow. °· Urinate as soon as you feel the need to. Do not try to hold your urine for long periods of time. °· If your health care provider approves, you may take a stool softener for 2-3 weeks to prevent you from straining to have a bowel movement. °· Wear compression stockings as told by your health care provider. These stockings help to prevent blood clots and reduce swelling in your legs. °· Keep all follow-up visits as told by your health care provider. This is important. °Contact a health care provider if: °· You have difficulty urinating. °· You have a fever. °· You have pain that gets worse or does not improve with medicine. °· You have blood in your urine that does not go away after 1 week of resting and drinking more fluids. °· You have swelling in your penis or testicles. °Get help right away if: °· You are unable to urinate. °· You are having more blood clots in your urine instead of fewer. °· You have: °? Large blood clots. °? A lot of blood in your urine. °? Pain in your back or lower abdomen. °? Pain or swelling in your legs. °? Chills and you are shaking. °This information is not intended to   replace advice given to you by your health care provider. Make sure you discuss any questions you have with your health care provider. °Document Released: 02/07/2005 Document Revised: 10/11/2015 Document Reviewed: 10/30/2014 °Elsevier Interactive Patient Education © 2017 [employer] ° °

## 2018-02-06 ENCOUNTER — Encounter (HOSPITAL_BASED_OUTPATIENT_CLINIC_OR_DEPARTMENT_OTHER): Payer: Self-pay | Admitting: Urology

## 2018-02-06 DIAGNOSIS — N401 Enlarged prostate with lower urinary tract symptoms: Secondary | ICD-10-CM | POA: Diagnosis not present

## 2018-02-06 DIAGNOSIS — R351 Nocturia: Secondary | ICD-10-CM | POA: Diagnosis not present

## 2018-02-06 DIAGNOSIS — N400 Enlarged prostate without lower urinary tract symptoms: Secondary | ICD-10-CM | POA: Diagnosis not present

## 2018-02-06 DIAGNOSIS — Z79899 Other long term (current) drug therapy: Secondary | ICD-10-CM | POA: Diagnosis not present

## 2018-02-06 LAB — CBC
HCT: 37.9 % — ABNORMAL LOW (ref 39.0–52.0)
HEMOGLOBIN: 12.5 g/dL — AB (ref 13.0–17.0)
MCH: 29.4 pg (ref 26.0–34.0)
MCHC: 33 g/dL (ref 30.0–36.0)
MCV: 89.2 fL (ref 80.0–100.0)
Platelets: 286 10*3/uL (ref 150–400)
RBC: 4.25 MIL/uL (ref 4.22–5.81)
RDW: 13 % (ref 11.5–15.5)
WBC: 15.4 10*3/uL — ABNORMAL HIGH (ref 4.0–10.5)
nRBC: 0 % (ref 0.0–0.2)

## 2018-02-06 LAB — BASIC METABOLIC PANEL
Anion gap: 5 (ref 5–15)
BUN: 15 mg/dL (ref 8–23)
CHLORIDE: 112 mmol/L — AB (ref 98–111)
CO2: 23 mmol/L (ref 22–32)
Calcium: 8.4 mg/dL — ABNORMAL LOW (ref 8.9–10.3)
Creatinine, Ser: 0.82 mg/dL (ref 0.61–1.24)
GFR calc Af Amer: >60 mL/min (ref >60)
GFR calc non Af Amer: >60 mL/min (ref >60)
Glucose, Bld: 108 mg/dL — ABNORMAL HIGH (ref 70–99)
Potassium: 4.1 mmol/L (ref 3.5–5.1)
Sodium: 140 mmol/L (ref 135–145)

## 2018-02-06 MED ORDER — MIRABEGRON ER 25 MG PO TB24: 25.0000 mg | ORAL_TABLET | Freq: Two times a day (BID) | ORAL | 3 refills | Status: DC

## 2018-02-06 MED ORDER — FINASTERIDE 5 MG PO TABS: 5.0000 mg | ORAL_TABLET | Freq: Every day | ORAL | 6 refills | Status: DC

## 2018-02-06 NOTE — Discharge Summary (Signed)
Physician Discharge Summary  Patient ID: Riley MalletReginald Singh MRN: [id number] DOB/AGE: 1955/05/16 62 y.o.  Admit date: 02/05/2018 Discharge date: 02/06/2018  Admission Diagnoses:  Discharge Diagnoses:  Active Problems:   BPH (benign prostatic hyperplasia)   Discharged Condition: good  Hospital Course: Patient underwent a TURP.  He was weaned off CBI and was stable following day.  Consults: None  Significant Diagnostic Studies: None  Treatments: surgery: TURP  Discharge Exam: Blood pressure 130/79, pulse 70, temperature 98.2 F (36.8 C), resp. rate 17, height 5\' 7"  (1.702 m), weight 67.3 kg, SpO2 99 %. General appearance: alert and appears stated age  Urine light red in the tubing.  Foley catheter in place  Disposition: Discharge disposition: 01-Home or Self Care        Allergies as of 02/06/2018   No Known Allergies     Medication List    STOP taking these medications   PRESCRIPTION MEDICATION     TAKE these medications   finasteride 5 MG tablet Commonly known as:  PROSCAR Take 1 tablet (5 mg total) by mouth at bedtime.   mirabegron ER 25 MG Tb24 tablet Commonly known as:  MYRBETRIQ Take 1 tablet (25 mg total) by mouth 2 (two) times daily.   oxyCODONE-acetaminophen 5-325 MG tablet Commonly known as:  PERCOCET Take 1 tablet by mouth every 4 (four) hours as needed for moderate pain or severe pain.   tamsulosin 0.4 MG Caps capsule Commonly known as:  FLOMAX Take 0.4 mg by mouth 2 (two) times daily. What changed:  Another medication with the same name was removed. Continue taking this medication, and follow the directions you see here.      Follow-up Information    McKenzie, Mardene CelestePatrick L, MD. Call in 1 week.   Specialty:  Urology Why:  voiding trial Contact information: [address] [place] [address] [phone]           Signed: Ray Churchugene D Bell, III 02/06/2018, 9:42 AM

## 2018-02-08 NOTE — Op Note (Signed)
Preoperative diagnosis: BPH  Postoperative diagnosis: BPH  Procedure: 1 cystoscopy 2. Transurethral resection of the prostate  Attending: Tammie Ellsworth  Anesthesia: General  Estimated blood loss: Minimal  Drains: 22 French foley  Specimens: 1. Prostate Chips  Antibiotics: Rocephin  Findings: Trilobar prostate enlargement. Ureteral orifices in normal anatomic location.   Indications: Patient is a 62-year-old male with a history of BPH and elevated PVR.  After discussing treatment options, they decided proceed with transurethral resection of the prostate.  Procedure her in detail: The patient was brought to the operating room and a brief timeout was done to ensure correct patient, correct procedure, correct site.  General anesthesia was administered patient was placed in dorsal lithotomy position.  Their genitalia was then prepped and draped in usual sterile fashion.  A rigid 22 French cystoscope was passed in the urethra and the bladder.  Bladder was inspected and we noted no masses or lesions.  the ureteral orifices were in the normal orthotopic locations. removed the cystoscope and placed a resectoscope into the bladder. We then turned our attention to the prostate resection. Using the bipolar resectoscope we resected the median lobe first from the bladder neck to the verumontanum. We then started at the 12 oclock position on the left lobe and resection to the 6 o'clock position from the bladder neck to the verumontanum. We then did the same resection of the right lobe. Once the resection was complete we then cauterized individual bleeders. We then removed the prostate chips and sent them for pathology.  We then re-inspected the prostatic fossa and found no residual bleeding.  the bladder was then drained, a 22 French foley was placed and this concluded the procedure which was well tolerated by patient.  Complications: None  Condition: Stable, extubated, transferred to PACU  Plan:  Patient is admitted overnight with continuous bladder irrigation. If their urine is clear tomorrow they will be discharged home and followup in 5 days for foley catheter removal and pathology discussion.  

## 2018-02-12 DIAGNOSIS — R351 Nocturia: Secondary | ICD-10-CM | POA: Diagnosis not present

## 2018-02-12 DIAGNOSIS — N401 Enlarged prostate with lower urinary tract symptoms: Secondary | ICD-10-CM | POA: Diagnosis not present

## 2018-04-05 DIAGNOSIS — R351 Nocturia: Secondary | ICD-10-CM | POA: Diagnosis not present

## 2018-04-05 DIAGNOSIS — R8271 Bacteriuria: Secondary | ICD-10-CM | POA: Diagnosis not present

## 2018-04-05 DIAGNOSIS — N401 Enlarged prostate with lower urinary tract symptoms: Secondary | ICD-10-CM | POA: Diagnosis not present

## 2018-04-20 DIAGNOSIS — N401 Enlarged prostate with lower urinary tract symptoms: Secondary | ICD-10-CM | POA: Diagnosis not present

## 2018-04-20 DIAGNOSIS — R351 Nocturia: Secondary | ICD-10-CM | POA: Diagnosis not present

## 2018-05-02 ENCOUNTER — Emergency Department (HOSPITAL_COMMUNITY): Payer: 59

## 2018-05-02 ENCOUNTER — Encounter (HOSPITAL_COMMUNITY): Payer: Self-pay

## 2018-05-02 DIAGNOSIS — M25512 Pain in left shoulder: Secondary | ICD-10-CM | POA: Diagnosis not present

## 2018-05-02 DIAGNOSIS — M7582 Other shoulder lesions, left shoulder: Secondary | ICD-10-CM | POA: Insufficient documentation

## 2018-05-02 DIAGNOSIS — M779 Enthesopathy, unspecified: Secondary | ICD-10-CM | POA: Diagnosis not present

## 2018-05-03 ENCOUNTER — Emergency Department (HOSPITAL_COMMUNITY)
Admission: EM | Admit: 2018-05-03 | Discharge: 2018-05-03 | Disposition: A | Discharge status: Home / Self Care | Payer: 59 | Attending: Emergency Medicine | Admitting: Emergency Medicine

## 2018-05-03 DIAGNOSIS — M7582 Other shoulder lesions, left shoulder: Secondary | ICD-10-CM

## 2018-05-03 DIAGNOSIS — M778 Other enthesopathies, not elsewhere classified: Secondary | ICD-10-CM

## 2018-05-03 MED ORDER — HYDROCODONE-ACETAMINOPHEN 5-325 MG PO TABS
ORAL_TABLET | ORAL | Status: AC
  Filled 2018-05-03: qty 2

## 2018-05-03 NOTE — ED Provider Notes (Signed)
Note completed during Epic downtime. See scanned note for further details.   Shaune Pollack, MD 05/03/18 1024

## 2018-05-09 DIAGNOSIS — M67912 Unspecified disorder of synovium and tendon, left shoulder: Secondary | ICD-10-CM | POA: Diagnosis not present

## 2018-11-02 DIAGNOSIS — Z0000 Encounter for general adult medical examination without abnormal findings: Secondary | ICD-10-CM | POA: Diagnosis not present

## 2018-11-09 DIAGNOSIS — E7800 Pure hypercholesterolemia, unspecified: Secondary | ICD-10-CM | POA: Diagnosis not present

## 2018-11-09 DIAGNOSIS — Z1211 Encounter for screening for malignant neoplasm of colon: Secondary | ICD-10-CM | POA: Diagnosis not present

## 2019-05-16 DIAGNOSIS — N400 Enlarged prostate without lower urinary tract symptoms: Secondary | ICD-10-CM | POA: Diagnosis not present

## 2019-05-16 DIAGNOSIS — R634 Abnormal weight loss: Secondary | ICD-10-CM | POA: Diagnosis not present

## 2019-05-18 ENCOUNTER — Ambulatory Visit: Payer: BC Managed Care – PPO | Attending: Internal Medicine

## 2019-05-18 DIAGNOSIS — Z23 Encounter for immunization: Secondary | ICD-10-CM

## 2019-05-18 NOTE — Progress Notes (Signed)
   Covid-19 Vaccination Clinic  Name:  [name]    MRN: [id number] DOB: [birth year]  05/18/2019  Mr. Mink was observed post Covid-19 immunization for 15 minutes without incident. He was provided with Vaccine Information Sheet and instruction to access the V-Safe system.   Mr. Achord was instructed to call 911 with any severe reactions post vaccine: Marland Kitchen Difficulty breathing  . Swelling of face and throat  . A fast heartbeat  . A bad rash all over body  . Dizziness and weakness   Immunizations Administered    Name Date Dose VIS Date Route   Pfizer COVID-19 Vaccine 05/18/2019  1:04 PM 0.3 mL 02/01/2019 Intramuscular   Manufacturer: [employer], [employer]   Lot: [id number]   NDC: [address]-2

## 2019-06-11 ENCOUNTER — Ambulatory Visit: Payer: BC Managed Care – PPO | Attending: Internal Medicine

## 2019-06-11 DIAGNOSIS — Z23 Encounter for immunization: Secondary | ICD-10-CM

## 2019-06-11 NOTE — Progress Notes (Signed)
   Covid-19 Vaccination Clinic  Name:  Hamza Empson    MRN: [id number] DOB: 04/20/1955  06/11/2019  Mr. Mckee was observed post Covid-19 immunization for 15 minutes without incident. He was provided with Vaccine Information Sheet and instruction to access the V-Safe system.   Mr. Strauch was instructed to call 911 with any severe reactions post vaccine: Marland Kitchen Difficulty breathing  . Swelling of face and throat  . A fast heartbeat  . A bad rash all over body  . Dizziness and weakness   Immunizations Administered    Name Date Dose VIS Date Route   Pfizer COVID-19 Vaccine 06/11/2019  3:03 PM 0.3 mL 04/17/2018 Intramuscular   Manufacturer: [employer], [employer]   Lot: [id number]   NDC: [address]-6

## 2019-12-21 DIAGNOSIS — N529 Male erectile dysfunction, unspecified: Secondary | ICD-10-CM

## 2019-12-21 HISTORY — DX: Male erectile dysfunction, unspecified: N52.9

## 2020-02-12 DIAGNOSIS — R350 Frequency of micturition: Secondary | ICD-10-CM | POA: Diagnosis not present

## 2020-02-12 DIAGNOSIS — M25519 Pain in unspecified shoulder: Secondary | ICD-10-CM | POA: Diagnosis not present

## 2020-02-12 DIAGNOSIS — R413 Other amnesia: Secondary | ICD-10-CM | POA: Diagnosis not present

## 2020-12-10 ENCOUNTER — Other Ambulatory Visit: Payer: Self-pay | Admitting: Family Medicine

## 2020-12-10 DIAGNOSIS — R109 Unspecified abdominal pain: Secondary | ICD-10-CM

## 2020-12-25 ENCOUNTER — Other Ambulatory Visit: Payer: BC Managed Care – PPO

## 2020-12-30 ENCOUNTER — Ambulatory Visit
Admission: RE | Admit: 2020-12-30 | Discharge: 2020-12-30 | Disposition: A | Discharge status: Home / Self Care | Payer: BC Managed Care – PPO | Source: Ambulatory Visit | Attending: Family Medicine | Admitting: Family Medicine

## 2020-12-30 DIAGNOSIS — K760 Fatty (change of) liver, not elsewhere classified: Secondary | ICD-10-CM | POA: Diagnosis not present

## 2020-12-30 DIAGNOSIS — N281 Cyst of kidney, acquired: Secondary | ICD-10-CM | POA: Diagnosis not present

## 2020-12-30 DIAGNOSIS — R109 Unspecified abdominal pain: Secondary | ICD-10-CM

## 2021-01-22 DIAGNOSIS — N2889 Other specified disorders of kidney and ureter: Secondary | ICD-10-CM | POA: Diagnosis not present

## 2021-01-22 DIAGNOSIS — K760 Fatty (change of) liver, not elsewhere classified: Secondary | ICD-10-CM | POA: Diagnosis not present

## 2021-01-22 DIAGNOSIS — R350 Frequency of micturition: Secondary | ICD-10-CM | POA: Diagnosis not present

## 2021-01-22 DIAGNOSIS — R413 Other amnesia: Secondary | ICD-10-CM | POA: Diagnosis not present

## 2021-01-27 ENCOUNTER — Other Ambulatory Visit: Payer: Self-pay | Admitting: Family Medicine

## 2021-01-27 DIAGNOSIS — N2889 Other specified disorders of kidney and ureter: Secondary | ICD-10-CM

## 2021-01-28 ENCOUNTER — Ambulatory Visit: Payer: BC Managed Care – PPO | Admitting: Physician Assistant

## 2021-02-19 ENCOUNTER — Ambulatory Visit
Admission: RE | Admit: 2021-02-19 | Discharge: 2021-02-19 | Disposition: A | Discharge status: Home / Self Care | Payer: BC Managed Care – PPO | Source: Ambulatory Visit | Attending: Family Medicine | Admitting: Family Medicine

## 2021-02-19 DIAGNOSIS — I700 Atherosclerosis of aorta: Secondary | ICD-10-CM | POA: Diagnosis not present

## 2021-02-19 DIAGNOSIS — R918 Other nonspecific abnormal finding of lung field: Secondary | ICD-10-CM | POA: Diagnosis not present

## 2021-02-19 DIAGNOSIS — N2889 Other specified disorders of kidney and ureter: Secondary | ICD-10-CM

## 2021-02-19 DIAGNOSIS — N281 Cyst of kidney, acquired: Secondary | ICD-10-CM | POA: Diagnosis not present

## 2021-02-19 MED ORDER — IOPAMIDOL (ISOVUE-300) INJECTION 61%
100.0000 mL | Freq: Once | INTRAVENOUS | Status: AC | PRN
  Administered 2021-02-19: 100 mL via INTRAVENOUS

## 2021-02-26 DIAGNOSIS — R413 Other amnesia: Secondary | ICD-10-CM | POA: Diagnosis not present

## 2021-02-26 DIAGNOSIS — Q6100 Congenital renal cyst, unspecified: Secondary | ICD-10-CM | POA: Diagnosis not present

## 2021-02-26 DIAGNOSIS — F69 Unspecified disorder of adult personality and behavior: Secondary | ICD-10-CM | POA: Diagnosis not present

## 2021-03-01 ENCOUNTER — Other Ambulatory Visit: Payer: Self-pay

## 2021-03-01 ENCOUNTER — Encounter: Payer: Self-pay | Admitting: Physician Assistant

## 2021-03-01 ENCOUNTER — Ambulatory Visit (INDEPENDENT_AMBULATORY_CARE_PROVIDER_SITE_OTHER): Payer: BC Managed Care – PPO | Admitting: Physician Assistant

## 2021-03-01 ENCOUNTER — Other Ambulatory Visit (INDEPENDENT_AMBULATORY_CARE_PROVIDER_SITE_OTHER): Payer: BC Managed Care – PPO

## 2021-03-01 VITALS — BP 142/82 | HR 73 | Ht 66.0 in | Wt 141.6 lb

## 2021-03-01 DIAGNOSIS — R569 Unspecified convulsions: Secondary | ICD-10-CM | POA: Diagnosis not present

## 2021-03-01 DIAGNOSIS — R413 Other amnesia: Secondary | ICD-10-CM | POA: Diagnosis not present

## 2021-03-01 LAB — VITAMIN B12: Vitamin B-12: 550 pg/mL (ref 211–911)

## 2021-03-01 LAB — TSH: TSH: 1.12 u[IU]/mL (ref 0.35–5.50)

## 2021-03-01 NOTE — Patient Instructions (Addendum)
It was a pleasure to see you today at our office.   Recommendations:  Neurocognitive evaluation at our office MRI of the brain, the radiology office will call you to arrange you appointment Check labs today EEG  Follow up  6 weeks and follow up after neurocognitive testing  RECOMMENDATIONS FOR ALL PATIENTS WITH MEMORY PROBLEMS: 1. Continue to exercise (Recommend 30 minutes of walking everyday, or 3 hours every week) 2. Increase social interactions - continue going to [place] and enjoy social gatherings with friends and family 3. Eat healthy, avoid fried foods and eat more fruits and vegetables 4. Maintain adequate blood pressure, blood sugar, and blood cholesterol level. Reducing the risk of stroke and cardiovascular disease also helps promoting better memory. 5. Avoid stressful situations. Live a simple life and avoid aggravations. Organize your time and prepare for the next day in anticipation. 6. Sleep well, avoid any interruptions of sleep and avoid any distractions in the bedroom that may interfere with adequate sleep quality 7. Avoid sugar, avoid sweets as there is a strong link between excessive sugar intake, diabetes, and cognitive impairment We discussed the Mediterranean diet, which has been shown to help patients reduce the risk of progressive memory disorders and reduces cardiovascular risk. This includes eating fish, eat fruits and green leafy vegetables, nuts like almonds and hazelnuts, walnuts, and also use olive oil. Avoid fast foods and fried foods as much as possible. Avoid sweets and sugar as sugar use has been linked to worsening of memory function.  There is always a concern of gradual progression of memory problems. If this is the case, then we may need to adjust level of care according to patient needs. Support, both to the patient and caregiver, should then be put into place.      You have been referred for a neuropsychological evaluation (i.e., evaluation of memory  and thinking abilities). Please bring someone with you to this appointment if possible, as it is helpful for the doctor to hear from both you and another adult who knows you well. Please bring eyeglasses and hearing aids if you wear them.    The evaluation will take approximately 3 hours and has two parts:   The first part is a clinical interview with the neuropsychologist (Dr. Melvyn Novas or Dr. Nicole Kindred). During the interview, the neuropsychologist will speak with you and the individual you brought to the appointment.    The second part of the evaluation is testing with the doctor's technician Hinton Dyer or Maudie Mercury). During the testing, the technician will ask you to remember different types of material, solve problems, and answer some questionnaires. Your family member will not be present for this portion of the evaluation.   Please note: We must reserve several hours of the neuropsychologist's time and the psychometrician's time for your evaluation appointment. As such, there is a No-Show fee of $100. If you are unable to attend any of your appointments, please contact our office as soon as possible to reschedule.    FALL PRECAUTIONS: Be cautious when walking. Scan the area for obstacles that may increase the risk of trips and falls. When getting up in the mornings, sit up at the edge of the bed for a few minutes before getting out of bed. Consider elevating the bed at the head end to avoid drop of blood pressure when getting up. Walk always in a well-lit room (use night lights in the walls). Avoid area rugs or power cords from appliances in the middle of the walkways. Use  a walker or a cane if necessary and consider physical therapy for balance exercise. Get your eyesight checked regularly.  FINANCIAL OVERSIGHT: Supervision, especially oversight when making financial decisions or transactions is also recommended.  HOME SAFETY: Consider the safety of the kitchen when operating appliances like stoves, microwave  oven, and blender. Consider having supervision and share cooking responsibilities until no longer able to participate in those. Accidents with firearms and other hazards in the house should be identified and addressed as well.   ABILITY TO BE LEFT ALONE: If patient is unable to contact 911 operator, consider using LifeLine, or when the need is there, arrange for someone to stay with patients. Smoking is a fire hazard, consider supervision or cessation. Risk of wandering should be assessed by caregiver and if detected at any point, supervision and safe proof recommendations should be instituted.  MEDICATION SUPERVISION: Inability to self-administer medication needs to be constantly addressed. Implement a mechanism to ensure safe administration of the medications.   DRIVING: Regarding driving, in patients with progressive memory problems, driving will be impaired. We advise to have someone else do the driving if trouble finding directions or if minor accidents are reported. Independent driving assessment is available to determine safety of driving.   If you are interested in the driving assessment, you can contact the following:  [employer] in [place]  [place] [phone] or [phone]    [place] refers to food and lifestyle choices that are based on the traditions of countries located on the [employer]. This way of eating has been shown to help prevent certain conditions and improve outcomes for people who have chronic diseases, like kidney disease and heart disease. What are tips for following this plan? Lifestyle  Cook and eat meals together with your family, when possible. Drink enough fluid to keep your urine clear or pale yellow. Be physically active every day. This includes: Aerobic exercise like running or  swimming. Leisure activities like gardening, walking, or housework. Get 7-8 hours of sleep each night. If recommended by your health care provider, drink red wine in moderation. This means 1 glass a day for nonpregnant women and 2 glasses a day for men. A glass of wine equals 5 oz (150 mL). Reading food labels  Check the serving size of packaged foods. For foods such as rice and pasta, the serving size refers to the amount of cooked product, not dry. Check the total fat in packaged foods. Avoid foods that have saturated fat or trans fats. Check the ingredients list for added sugars, such as corn syrup. Shopping  At the grocery store, buy most of your food from the areas near the walls of the store. This includes: Fresh fruits and vegetables (produce). Grains, beans, nuts, and seeds. Some of these may be available in unpackaged forms or large amounts (in bulk). Fresh seafood. Poultry and eggs. Low-fat dairy products. Buy whole ingredients instead of prepackaged foods. Buy fresh fruits and vegetables in-season from local farmers markets. Buy frozen fruits and vegetables in resealable bags. If you do not have access to quality fresh seafood, buy precooked frozen shrimp or canned fish, such as tuna, salmon, or sardines. Buy small amounts of raw or cooked vegetables, salads, or olives from the deli or salad bar at your store. Stock your pantry so you always have certain foods on hand, such as olive oil, canned tuna, canned tomatoes, rice, pasta,  and beans. Cooking  Cook foods with extra-virgin olive oil instead of using butter or other vegetable oils. Have meat as a side dish, and have vegetables or grains as your main dish. This means having meat in small portions or adding small amounts of meat to foods like pasta or stew. Use beans or vegetables instead of meat in common dishes like chili or lasagna. Experiment with different cooking methods. Try roasting or broiling vegetables instead of  steaming or sauteing them. Add frozen vegetables to soups, stews, pasta, or rice. Add nuts or seeds for added healthy fat at each meal. You can add these to yogurt, salads, or vegetable dishes. Marinate fish or vegetables using olive oil, lemon juice, garlic, and fresh herbs. Meal planning  Plan to eat 1 vegetarian meal one day each week. Try to work up to 2 vegetarian meals, if possible. Eat seafood 2 or more times a week. Have healthy snacks readily available, such as: Vegetable sticks with hummus. Greek yogurt. Fruit and nut trail mix. Eat balanced meals throughout the week. This includes: Fruit: 2-3 servings a day Vegetables: 4-5 servings a day Low-fat dairy: 2 servings a day Fish, poultry, or lean meat: 1 serving a day Beans and legumes: 2 or more servings a week Nuts and seeds: 1-2 servings a day Whole grains: 6-8 servings a day Extra-virgin olive oil: 3-4 servings a day Limit red meat and sweets to only a few servings a month What are my food choices? Mediterranean diet Recommended Grains: Whole-grain pasta. Brown rice. Bulgar wheat. Polenta. Couscous. Whole-wheat bread. [place]. Vegetables: Artichokes. Beets. Broccoli. Cabbage. Carrots. Eggplant. Green beans. Chard. Kale. Spinach. Onions. Leeks. Peas. Squash. Tomatoes. Peppers. Radishes. Fruits: Apples. Apricots. Avocado. Berries. Bananas. Cherries. Dates. Figs. Grapes. Lemons. Melon. Oranges. Peaches. Plums. Pomegranate. Meats and other protein foods: Beans. Almonds. Sunflower seeds. Pine nuts. Peanuts. [place]. Salmon. Scallops. Shrimp. Delight. Tilapia. Clams. Oysters. Eggs. Dairy: Low-fat milk. Cheese. Greek yogurt. Beverages: Water. Red wine. Herbal tea. Fats and oils: Extra virgin olive oil. Avocado oil. Grape seed oil. Sweets and desserts: [place] yogurt with honey. Baked apples. Poached pears. Trail mix. Seasoning and other foods: Basil. Cilantro. Coriander. Cumin. Mint. Parsley. Sage. Rosemary. Tarragon. Garlic.  Oregano. Thyme. Pepper. Balsalmic vinegar. Tahini. Hummus. Tomato sauce. Olives. Mushrooms. Limit these Grains: Prepackaged pasta or rice dishes. Prepackaged cereal with added sugar. Vegetables: Deep fried potatoes (french fries). Fruits: Fruit canned in syrup. Meats and other protein foods: Beef. Pork. Lamb. Poultry with skin. Hot dogs. Bernie[place] Salines. Dairy: Ice cream. Sour cream. Whole milk. Beverages: Juice. Sugar-sweetened soft drinks. Beer. Liquor and spirits. Fats and oils: Butter. Canola oil. Vegetable oil. Beef fat (tallow). Lard. Sweets and desserts: Cookies. Cakes. Pies. Candy. Seasoning and other foods: Mayonnaise. Premade sauces and marinades. The items listed may not be a complete list. Talk with your dietitian about what dietary choices are right for you. Summary The Mediterranean diet includes both food and lifestyle choices. Eat a variety of fresh fruits and vegetables, beans, nuts, seeds, and whole grains. Limit the amount of red meat and sweets that you eat. Talk with your health care provider about whether it is safe for you to drink red wine in moderation. This means 1 glass a day for nonpregnant women and 2 glasses a day for men. A glass of wine equals 5 oz (150 mL). This information is not intended to replace advice given to you by your health care provider. Make sure you discuss any questions you have with your health care provider.  Document Released: 10/01/2015 Document Revised: 11/03/2015 Document Reviewed: 10/01/2015 Elsevier Interactive Patient Education  2017 [employer].   We have sent a referral to [facility] for your MRI and they will call you directly to schedule your appointment. They are located at [place]. If you need to contact them directly please call [phone].  We have sent a referral to [facility] for your MRI and they will call you directly to schedule your appointment. They are located at [place]. If you need to  contact them directly please call [phone].
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
## 2021-03-05 LAB — VITAMIN B1: Vitamin B1 (Thiamine): 15 nmol/L (ref 8–30)

## 2021-03-12 DIAGNOSIS — Z01818 Encounter for other preprocedural examination: Secondary | ICD-10-CM | POA: Diagnosis not present

## 2021-03-16 ENCOUNTER — Other Ambulatory Visit: Payer: Self-pay

## 2021-03-16 ENCOUNTER — Ambulatory Visit
Admission: RE | Admit: 2021-03-16 | Discharge: 2021-03-16 | Disposition: A | Discharge status: Home / Self Care | Payer: BC Managed Care – PPO | Source: Ambulatory Visit | Attending: Physician Assistant | Admitting: Physician Assistant

## 2021-03-16 DIAGNOSIS — R479 Unspecified speech disturbances: Secondary | ICD-10-CM | POA: Diagnosis not present

## 2021-03-16 DIAGNOSIS — G319 Degenerative disease of nervous system, unspecified: Secondary | ICD-10-CM | POA: Diagnosis not present

## 2021-03-16 DIAGNOSIS — R413 Other amnesia: Secondary | ICD-10-CM | POA: Diagnosis not present

## 2021-03-16 DIAGNOSIS — G9389 Other specified disorders of brain: Secondary | ICD-10-CM | POA: Diagnosis not present

## 2021-03-16 MED ORDER — GADOBENATE DIMEGLUMINE 529 MG/ML IV SOLN
13.0000 mL | Freq: Once | INTRAVENOUS | Status: AC | PRN
  Administered 2021-03-16: 13 mL via INTRAVENOUS

## 2021-03-17 DIAGNOSIS — M67911 Unspecified disorder of synovium and tendon, right shoulder: Secondary | ICD-10-CM | POA: Diagnosis not present

## 2021-03-24 ENCOUNTER — Ambulatory Visit (INDEPENDENT_AMBULATORY_CARE_PROVIDER_SITE_OTHER): Payer: BC Managed Care – PPO | Admitting: Psychology

## 2021-03-24 ENCOUNTER — Ambulatory Visit: Payer: BC Managed Care – PPO | Admitting: Psychology

## 2021-03-24 ENCOUNTER — Other Ambulatory Visit: Payer: Self-pay

## 2021-03-24 ENCOUNTER — Encounter: Payer: Self-pay | Admitting: Psychology

## 2021-03-24 DIAGNOSIS — M25551 Pain in right hip: Secondary | ICD-10-CM | POA: Insufficient documentation

## 2021-03-24 DIAGNOSIS — G3101 Pick's disease: Secondary | ICD-10-CM

## 2021-03-24 DIAGNOSIS — F609 Personality disorder, unspecified: Secondary | ICD-10-CM | POA: Insufficient documentation

## 2021-03-24 DIAGNOSIS — F0280 Dementia in other diseases classified elsewhere without behavioral disturbance: Secondary | ICD-10-CM | POA: Insufficient documentation

## 2021-03-24 DIAGNOSIS — I700 Atherosclerosis of aorta: Secondary | ICD-10-CM | POA: Insufficient documentation

## 2021-03-24 DIAGNOSIS — F0390 Unspecified dementia without behavioral disturbance: Secondary | ICD-10-CM | POA: Diagnosis not present

## 2021-03-24 DIAGNOSIS — I6781 Acute cerebrovascular insufficiency: Secondary | ICD-10-CM

## 2021-03-24 DIAGNOSIS — G4700 Insomnia, unspecified: Secondary | ICD-10-CM | POA: Insufficient documentation

## 2021-03-24 DIAGNOSIS — R4189 Other symptoms and signs involving cognitive functions and awareness: Secondary | ICD-10-CM

## 2021-03-24 DIAGNOSIS — R350 Frequency of micturition: Secondary | ICD-10-CM | POA: Insufficient documentation

## 2021-03-24 DIAGNOSIS — K760 Fatty (change of) liver, not elsewhere classified: Secondary | ICD-10-CM | POA: Insufficient documentation

## 2021-03-24 DIAGNOSIS — E7800 Pure hypercholesterolemia, unspecified: Secondary | ICD-10-CM | POA: Insufficient documentation

## 2021-03-24 DIAGNOSIS — M25519 Pain in unspecified shoulder: Secondary | ICD-10-CM | POA: Insufficient documentation

## 2021-03-24 DIAGNOSIS — N281 Cyst of kidney, acquired: Secondary | ICD-10-CM | POA: Insufficient documentation

## 2021-03-24 HISTORY — DX: Dementia in other diseases classified elsewhere, unspecified severity, without behavioral disturbance, psychotic disturbance, mood disturbance, and anxiety: F02.80

## 2021-03-24 HISTORY — DX: Unspecified dementia, unspecified severity, without behavioral disturbance, psychotic disturbance, mood disturbance, and anxiety: F03.90

## 2021-03-24 NOTE — Progress Notes (Signed)
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 evaluation. Also reviewed were the foreseeable risks and/or discomforts and benefits of the procedure, limits of confidentiality, and mandatory reporting requirements of this provider. The patient was given the opportunity to ask questions and receive answers about the evaluation. Oral consent to participate was provided by the patient.   This evaluation was conducted by Newman Nickels, Ph.D., ABPP-CN, board certified clinical neuropsychologist. Mr. Bazinet completed a clinical interview with Dr. Milbert Coulter, billed as one unit [phone], and 90 minutes of cognitive testing and scoring, billed as one unit [phone] and two additional units [id number]. Psychometrist Wallace Keller, B.S., assisted Dr. Milbert Coulter with test administration and scoring procedures. As a separate and discrete service, Dr. Milbert Coulter spent a total of 160 minutes in interpretation and report writing billed as one unit [phone] and two units 96133.

## 2021-03-24 NOTE — Progress Notes (Signed)
° °  Psychometrician Note   Cognitive testing was administered to [name] by Milana Kidney, B.S. (psychometrist) under the supervision of Dr. Christia Reading, Ph.D., licensed psychologist on 03/24/21. [name] did not appear overtly distressed by the testing session per behavioral observation or responses across self-report questionnaires. Rest breaks were offered.    The battery of tests administered was selected by Dr. Christia Reading, Ph.D. with consideration to [name]'s current level of functioning, the nature of his symptoms, emotional and behavioral responses during interview, level of literacy, observed level of motivation/effort, and the nature of the referral question. This battery was communicated to the psychometrist. Communication between Dr. Christia Reading, Ph.D. and the psychometrist was ongoing throughout the evaluation and Dr. Christia Reading, Ph.D. was immediately accessible at all times. Dr. Christia Reading, Ph.D. provided supervision to the psychometrist on the date of this service to the extent necessary to assure the quality of all services provided.    [name] will return within approximately 1-2 weeks for an interactive feedback session with Dr. Melvyn Novas at which time his test performances, clinical impressions, and treatment recommendations will be reviewed in detail. [name] understands he can contact our office should he require our assistance before this time.  A total of 90 minutes of billable time were spent face-to-face with [name] by the psychometrist. This includes both test administration and scoring time. Billing for these services is reflected in the clinical report generated by Dr. Christia Reading, Ph.D.  This note reflects time spent with the psychometrician and does not include test scores or any clinical interpretations made by Dr. Melvyn Novas. The full report will follow in a separate note.

## 2021-03-25 ENCOUNTER — Encounter: Payer: Self-pay | Admitting: Psychology

## 2021-03-26 DIAGNOSIS — N529 Male erectile dysfunction, unspecified: Secondary | ICD-10-CM | POA: Diagnosis not present

## 2021-03-29 ENCOUNTER — Ambulatory Visit (INDEPENDENT_AMBULATORY_CARE_PROVIDER_SITE_OTHER): Payer: 59 | Admitting: Neurology

## 2021-03-29 ENCOUNTER — Other Ambulatory Visit: Payer: Self-pay

## 2021-03-29 DIAGNOSIS — R569 Unspecified convulsions: Secondary | ICD-10-CM

## 2021-03-30 DIAGNOSIS — M659 Synovitis and tenosynovitis, unspecified: Secondary | ICD-10-CM | POA: Diagnosis not present

## 2021-03-30 DIAGNOSIS — M7551 Bursitis of right shoulder: Secondary | ICD-10-CM | POA: Diagnosis not present

## 2021-03-30 DIAGNOSIS — M24111 Other articular cartilage disorders, right shoulder: Secondary | ICD-10-CM | POA: Diagnosis not present

## 2021-03-30 DIAGNOSIS — G8918 Other acute postprocedural pain: Secondary | ICD-10-CM | POA: Diagnosis not present

## 2021-03-30 DIAGNOSIS — M7541 Impingement syndrome of right shoulder: Secondary | ICD-10-CM | POA: Diagnosis not present

## 2021-03-30 DIAGNOSIS — M7531 Calcific tendinitis of right shoulder: Secondary | ICD-10-CM | POA: Diagnosis not present

## 2021-04-05 NOTE — Procedures (Signed)
ELECTROENCEPHALOGRAM REPORT  Date of Study: 03/29/2021  Patient's Name: [name] MRN: [id number] Date of Birth: November 19, 1955  Referring Provider: [name], PA-C  Clinical History: This is a 66 year old man with shaking episodes. EEG for classification.  Medications: TYLENOL  325 MG tablet PROSCAR  5 mg MYRBETRIQ  25 mg FLOMAX  0.4 mg  Technical Summary: A multichannel digital EEG recording measured by the international 10-20 system with electrodes applied with paste and impedances below 5000 ohms performed in our laboratory with EKG monitoring in an awake and asleep patient.  Hyperventilation was not performed. Photic stimulation was performed.  The digital EEG was referentially recorded, reformatted, and digitally filtered in a variety of bipolar and referential montages for optimal display.    Description: The patient is awake and asleep during the recording.  During maximal wakefulness, there is a symmetric, medium voltage 9 Hz posterior dominant rhythm that attenuates with eye opening.  The record is symmetric.  During drowsiness and sleep, there is an increase in theta slowing of the background.  Vertex waves and symmetric sleep spindles were seen.  Photic stimulation did not elicit any abnormalities.  There were no epileptiform discharges or electrographic seizures seen.    EKG lead was unremarkable.  Impression: This awake and asleep EEG is normal.    Clinical Correlation: A normal EEG does not exclude a clinical diagnosis of epilepsy.  If further clinical questions remain, prolonged EEG may be helpful.  Clinical correlation is advised.   Patrcia Dolly, M.D.

## 2021-04-05 NOTE — Progress Notes (Signed)
Please let pt know that his EEG is normal, no seizures seen thanks

## 2021-04-06 ENCOUNTER — Ambulatory Visit (INDEPENDENT_AMBULATORY_CARE_PROVIDER_SITE_OTHER): Payer: 59 | Admitting: Psychology

## 2021-04-06 ENCOUNTER — Other Ambulatory Visit: Payer: Self-pay

## 2021-04-06 DIAGNOSIS — F0280 Dementia in other diseases classified elsewhere without behavioral disturbance: Secondary | ICD-10-CM

## 2021-04-06 DIAGNOSIS — G3101 Pick's disease: Secondary | ICD-10-CM | POA: Diagnosis not present

## 2021-04-06 DIAGNOSIS — F0390 Unspecified dementia without behavioral disturbance: Secondary | ICD-10-CM | POA: Diagnosis not present

## 2021-04-06 NOTE — Progress Notes (Signed)
° °  Neuropsychology Feedback Session [name]. [facility] [place] Department of Neurology  Reason for Referral:   Liliana Brentlinger is a 66 y.o. right-handed African-American male referred by  [name], PA-C , to characterize his current cognitive functioning and assist with diagnostic clarity and treatment planning in the context of subjective cognitive decline.  Feedback:   [name] completed a comprehensive neuropsychological evaluation on 03/24/2021. Please refer to that encounter for the full report and recommendations. Briefly, results suggested severe, diffuse cognitive impairment across all assessed cognitive domains. Impairment surrounding his ability to comprehend spoken and written language (i.e., receptive language) was particularly striking. This and a lack of appreciation for semantic information represented his two most prominent impairments. However, all performances exhibited significant impairment relative to age-matched peers. The etiology for ongoing impairment is somewhat difficult to discern given diffuse impairment with no patterns of strengths. However, the severity of his language impairment raises particular concerns for the presence of a primary progressive aphasia (PPA) presentation. However, within this presentation, identifying the most likely subtype is quite challenging. Overall, we are faced with considerations of a semantic dementia presentation with some abnormal cognitive findings or a logopenic Alzheimer's presentation with some abnormal anatomical findings.   [name] was accompanied by his wife and daughter during the current feedback session. Content of the current session focused on the results of his neuropsychological evaluation. [name] was given the opportunity to ask questions and his questions were answered. He was encouraged to reach out should additional questions arise. A copy of his report was provided at the conclusion  of the visit.      30 minutes were spent conducting the current feedback session with Mr. Sees, billed as one unit [phone].

## 2021-04-08 DIAGNOSIS — M7531 Calcific tendinitis of right shoulder: Secondary | ICD-10-CM | POA: Diagnosis not present

## 2021-04-08 DIAGNOSIS — M7541 Impingement syndrome of right shoulder: Secondary | ICD-10-CM | POA: Diagnosis not present

## 2021-04-12 ENCOUNTER — Encounter: Payer: Self-pay | Admitting: Physician Assistant

## 2021-04-13 DIAGNOSIS — M7541 Impingement syndrome of right shoulder: Secondary | ICD-10-CM | POA: Diagnosis not present

## 2021-04-13 DIAGNOSIS — M7531 Calcific tendinitis of right shoulder: Secondary | ICD-10-CM | POA: Diagnosis not present

## 2021-04-15 DIAGNOSIS — M7531 Calcific tendinitis of right shoulder: Secondary | ICD-10-CM | POA: Diagnosis not present

## 2021-04-15 DIAGNOSIS — M7541 Impingement syndrome of right shoulder: Secondary | ICD-10-CM | POA: Diagnosis not present

## 2021-04-23 ENCOUNTER — Encounter: Payer: Self-pay | Admitting: Physician Assistant

## 2021-04-23 ENCOUNTER — Ambulatory Visit: Payer: BC Managed Care – PPO | Admitting: Physician Assistant

## 2021-04-23 DIAGNOSIS — Z029 Encounter for administrative examinations, unspecified: Secondary | ICD-10-CM

## 2021-04-28 ENCOUNTER — Other Ambulatory Visit: Payer: Self-pay

## 2021-04-28 ENCOUNTER — Ambulatory Visit (INDEPENDENT_AMBULATORY_CARE_PROVIDER_SITE_OTHER): Payer: 59 | Admitting: Physician Assistant

## 2021-04-28 ENCOUNTER — Encounter: Payer: Self-pay | Admitting: Physician Assistant

## 2021-04-28 VITALS — BP 138/74 | HR 64 | Resp 20 | Ht 68.0 in

## 2021-04-28 DIAGNOSIS — F0390 Unspecified dementia without behavioral disturbance: Secondary | ICD-10-CM

## 2021-04-28 DIAGNOSIS — F0280 Dementia in other diseases classified elsewhere without behavioral disturbance: Secondary | ICD-10-CM

## 2021-04-28 DIAGNOSIS — G3101 Pick's disease: Secondary | ICD-10-CM | POA: Diagnosis not present

## 2021-04-28 MED ORDER — RIVASTIGMINE TARTRATE 1.5 MG PO CAPS: ORAL_CAPSULE | ORAL | 11 refills | Status: DC
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
## 2021-04-28 NOTE — Patient Instructions (Addendum)
It was a pleasure to see you today at our office.   Recommendations:  Meds: Follow up in 3  months Start rivastigmine 1.5 mg one tab at night for 10 days then increase to 1.5 mg 2 times a day, in 1 month will increase to 3 mg 2x a day  Recommend no further driving , list of driving exam companies are listed   RECOMMENDATIONS FOR ALL PATIENTS WITH MEMORY PROBLEMS: 1. Continue to exercise (Recommend 30 minutes of walking everyday, or 3 hours every week) 2. Increase social interactions - continue going to [place] and enjoy social gatherings with friends and family 3. Eat healthy, avoid fried foods and eat more fruits and vegetables 4. Maintain adequate blood pressure, blood sugar, and blood cholesterol level. Reducing the risk of stroke and cardiovascular disease also helps promoting better memory. 5. Avoid stressful situations. Live a simple life and avoid aggravations. Organize your time and prepare for the next day in anticipation. 6. Sleep well, avoid any interruptions of sleep and avoid any distractions in the bedroom that may interfere with adequate sleep quality 7. Avoid sugar, avoid sweets as there is a strong link between excessive sugar intake, diabetes, and cognitive impairment We discussed the Mediterranean diet, which has been shown to help patients reduce the risk of progressive memory disorders and reduces cardiovascular risk. This includes eating fish, eat fruits and green leafy vegetables, nuts like almonds and hazelnuts, walnuts, and also use olive oil. Avoid fast foods and fried foods as much as possible. Avoid sweets and sugar as sugar use has been linked to worsening of memory function.  There is always a concern of gradual progression of memory problems. If this is the case, then we may need to adjust level of care according to patient needs. Support, both to the patient and caregiver, should then be put into place.    The [employer] is here all day, every day for  people facing Alzheimers disease through our free 24/7 Helpline: [phone]. The Helpline provides reliable information and support to all those who need assistance, such as individuals living with memory loss, Alzheimer's or other dementia, caregivers, health care professionals and the public.  Our highly trained and knowledgeable staff can help you with: Understanding memory loss, dementia and Alzheimer's  Medications and other treatment options  General information about aging and brain health  Skills to provide quality care and to find the best care from professionals  Legal, financial and living-arrangement decisions Our Helpline also features: Confidential care consultation provided by master's level clinicians who can help with decision-making support, crisis assistance and education on issues families face every day  Help in a caller's preferred language using our translation service that features more than 200 languages and dialects  Referrals to local community programs, services and ongoing support     FALL PRECAUTIONS: Be cautious when walking. Scan the area for obstacles that may increase the risk of trips and falls. When getting up in the mornings, sit up at the edge of the bed for a few minutes before getting out of bed. Consider elevating the bed at the head end to avoid drop of blood pressure when getting up. Walk always in a well-lit room (use night lights in the walls). Avoid area rugs or power cords from appliances in the middle of the walkways. Use a walker or a cane if necessary and consider physical therapy for balance exercise. Get your eyesight checked regularly.  FINANCIAL OVERSIGHT: Supervision, especially oversight when making financial  decisions or transactions is also recommended.  HOME SAFETY: Consider the safety of the kitchen when operating appliances like stoves, microwave oven, and blender. Consider having supervision and share cooking responsibilities until  no longer able to participate in those. Accidents with firearms and other hazards in the house should be identified and addressed as well.   ABILITY TO BE LEFT ALONE: If patient is unable to contact 911 operator, consider using LifeLine, or when the need is there, arrange for someone to stay with patients. Smoking is a fire hazard, consider supervision or cessation. Risk of wandering should be assessed by caregiver and if detected at any point, supervision and safe proof recommendations should be instituted.  MEDICATION SUPERVISION: Inability to self-administer medication needs to be constantly addressed. Implement a mechanism to ensure safe administration of the medications.   DRIVING: Regarding driving, in patients with progressive memory problems, driving will be impaired. We advise to have someone else do the driving if trouble finding directions or if minor accidents are reported. Independent driving assessment is available to determine safety of driving.   If you are interested in the driving assessment, you can contact the following:  [employer] in [place] [phone]  [facility] [phone]  [facility] [phone] or [phone]      Mediterranean Diet A Mediterranean diet refers to food and lifestyle choices that are based on the traditions of countries located on the [employer]. This way of eating has been shown to help prevent certain conditions and improve outcomes for people who have chronic diseases, like kidney disease and heart disease. What are tips for following this plan? Lifestyle  Cook and eat meals together with your family, when possible. Drink enough fluid to keep your urine clear or pale yellow. Be physically active every day. This includes: Aerobic exercise like running or swimming. Leisure activities like gardening, walking, or housework. Get 7-8 hours of sleep each  night. If recommended by your health care provider, drink red wine in moderation. This means 1 glass a day for nonpregnant women and 2 glasses a day for men. A glass of wine equals 5 oz (150 mL). Reading food labels  Check the serving size of packaged foods. For foods such as rice and pasta, the serving size refers to the amount of cooked product, not dry. Check the total fat in packaged foods. Avoid foods that have saturated fat or trans fats. Check the ingredients list for added sugars, such as corn syrup. Shopping  At the grocery store, buy most of your food from the areas near the walls of the store. This includes: Fresh fruits and vegetables (produce). Grains, beans, nuts, and seeds. Some of these may be available in unpackaged forms or large amounts (in bulk). Fresh seafood. Poultry and eggs. Low-fat dairy products. Buy whole ingredients instead of prepackaged foods. Buy fresh fruits and vegetables in-season from local farmers markets. Buy frozen fruits and vegetables in resealable bags. If you do not have access to quality fresh seafood, buy precooked frozen shrimp or canned fish, such as tuna, salmon, or sardines. Buy small amounts of raw or cooked vegetables, salads, or olives from the deli or salad bar at your store. Stock your pantry so you always have certain foods on hand, such as olive oil, canned tuna, canned tomatoes, rice, pasta, and beans. Cooking  Cook foods with extra-virgin olive oil instead of using butter or other vegetable oils. Have meat as a side dish, and have  vegetables or grains as your main dish. This means having meat in small portions or adding small amounts of meat to foods like pasta or stew. Use beans or vegetables instead of meat in common dishes like chili or lasagna. Experiment with different cooking methods. Try roasting or broiling vegetables instead of steaming or sauteing them. Add frozen vegetables to soups, stews, pasta, or rice. Add nuts or seeds  for added healthy fat at each meal. You can add these to yogurt, salads, or vegetable dishes. Marinate fish or vegetables using olive oil, lemon juice, garlic, and fresh herbs. Meal planning  Plan to eat 1 vegetarian meal one day each week. Try to work up to 2 vegetarian meals, if possible. Eat seafood 2 or more times a week. Have healthy snacks readily available, such as: Vegetable sticks with hummus. Greek yogurt. Fruit and nut trail mix. Eat balanced meals throughout the week. This includes: Fruit: 2-3 servings a day Vegetables: 4-5 servings a day Low-fat dairy: 2 servings a day Fish, poultry, or lean meat: 1 serving a day Beans and legumes: 2 or more servings a week Nuts and seeds: 1-2 servings a day Whole grains: 6-8 servings a day Extra-virgin olive oil: 3-4 servings a day Limit red meat and sweets to only a few servings a month What are my food choices? Mediterranean diet Recommended Grains: Whole-grain pasta. Brown rice. Bulgar wheat. Polenta. Couscous. Whole-wheat bread. Orpah Cobb. Vegetables: Artichokes. Beets. Broccoli. Cabbage. Carrots. Eggplant. Green beans. Chard. Kale. Spinach. Onions. Leeks. Peas. Squash. Tomatoes. Peppers. Radishes. Fruits: Apples. Apricots. Avocado. Berries. Bananas. Cherries. Dates. Figs. Grapes. Lemons. Melon. Oranges. Peaches. Plums. Pomegranate. Meats and other protein foods: Beans. Almonds. Sunflower seeds. Pine nuts. Peanuts. Cod. Salmon. Scallops. Shrimp. Tuna. Tilapia. Clams. Oysters. Eggs. Dairy: Low-fat milk. Cheese. Greek yogurt. Beverages: Water. Red wine. Herbal tea. Fats and oils: Extra virgin olive oil. Avocado oil. Grape seed oil. Sweets and desserts: [place] yogurt with honey. Baked apples. Poached pears. Trail mix. Seasoning and other foods: Basil. Cilantro. Coriander. Cumin. Mint. Parsley. Sage. Rosemary. Tarragon. Garlic. Oregano. Thyme. Pepper. Balsalmic vinegar. Tahini. Hummus. Tomato sauce. Olives. Mushrooms. Limit  these Grains: Prepackaged pasta or rice dishes. Prepackaged cereal with added sugar. Vegetables: Deep fried potatoes (french fries). Fruits: Fruit canned in syrup. Meats and other protein foods: Beef. Pork. Lamb. Poultry with skin. Hot dogs. Tomasa Blase. Dairy: Ice cream. Sour cream. Whole milk. Beverages: Juice. Sugar-sweetened soft drinks. Beer. Liquor and spirits. Fats and oils: Butter. Canola oil. Vegetable oil. Beef fat (tallow). Lard. Sweets and desserts: Cookies. Cakes. Pies. Candy. Seasoning and other foods: Mayonnaise. Premade sauces and marinades. The items listed may not be a complete list. Talk with your dietitian about what dietary choices are right for you. Summary The Mediterranean diet includes both food and lifestyle choices. Eat a variety of fresh fruits and vegetables, beans, nuts, seeds, and whole grains. Limit the amount of red meat and sweets that you eat. Talk with your health care provider about whether it is safe for you to drink red wine in moderation. This means 1 glass a day for nonpregnant women and 2 glasses a day for men. A glass of wine equals 5 oz (150 mL). This information is not intended to replace advice given to you by your health care provider. Make sure you discuss any questions you have with your health care provider. Document Released: 10/01/2015 Document Revised: 11/03/2015 Document Reviewed: 10/01/2015 Elsevier Interactive Patient Education  2017 [employer].

## 2021-05-06 ENCOUNTER — Telehealth: Payer: Self-pay | Admitting: Physician Assistant

## 2021-05-06 NOTE — Telephone Encounter (Signed)
Pt's spouse called in and left a message with the access nurse after we closed yesterday. The patient is vomiting, has dizziness, and has not been able to urinate in the last 8 hours. They believe it is due to the rivastigmine. Symptoms started on 05/04/21. I have printed the access nurse report and put it in [facility] box. ?

## 2021-05-06 NOTE — Telephone Encounter (Signed)
Spoke with pt spouse Hold the medicine for a week and call us back to let us know how he is doing , and see if the symptoms subside. If they don't it may be due to other causes ?

## 2021-05-06 NOTE — Telephone Encounter (Signed)
Pt spouse stated that he has nausea and vomiting after starting rivastigmine. Pt advised to stop medication ( advised to tell pt per Dr Everlena Cooper) and that I would call him back with any recommendations from [facility].  ?

## 2021-05-06 NOTE — Telephone Encounter (Signed)
Pt and spouse called no answer left a voice mail to call the office back  ?

## 2021-05-14 ENCOUNTER — Telehealth: Payer: Self-pay | Admitting: Physician Assistant

## 2021-05-14 NOTE — Telephone Encounter (Signed)
Called to let wertman know patient has had no dizziness or diarrhea since changing rivastigmine Needs to know what they need to do.  ?

## 2021-05-14 NOTE — Telephone Encounter (Signed)
Left message to contact office at 3:31pm   ?

## 2021-05-14 NOTE — Telephone Encounter (Signed)
Patient advised will call with update in a few weeks.  ?

## 2021-05-18 DIAGNOSIS — Z0279 Encounter for issue of other medical certificate: Secondary | ICD-10-CM

## 2021-05-19 ENCOUNTER — Telehealth: Payer: Self-pay

## 2021-05-19 NOTE — Telephone Encounter (Signed)
Mailed and faxed DMV paper. Copy to scan  ?

## 2021-06-11 ENCOUNTER — Encounter: Payer: Self-pay | Admitting: Physician Assistant

## 2021-06-11 ENCOUNTER — Telehealth: Payer: Self-pay | Admitting: Neurology

## 2021-06-11 NOTE — Telephone Encounter (Signed)
Pt's daughter called in and left a message. She stated they had gotten a letter about the pt's Driving Re-Evaluation Form. They said by the end of the month the pt's licence would be revoked due to not getting paperwork. ?

## 2021-06-11 NOTE — Telephone Encounter (Signed)
Daughter notified of refax DMV forms, placed in file folder copy.  ?

## 2021-06-16 ENCOUNTER — Telehealth: Payer: Self-pay | Admitting: Physician Assistant

## 2021-06-16 NOTE — Telephone Encounter (Signed)
[name] states page 7 is missing off the form. Would like a call back ?

## 2021-06-16 NOTE — Telephone Encounter (Signed)
Patient's wife called back.

## 2021-06-16 NOTE — Telephone Encounter (Signed)
Refaxed DMV papers advised to wife ? ?

## 2021-06-16 NOTE — Telephone Encounter (Signed)
I printed off dmv papers, left message to call office to see what is needed and where to send copy.  ?

## 2021-07-29 ENCOUNTER — Encounter: Payer: Self-pay | Admitting: Physician Assistant

## 2021-07-29 ENCOUNTER — Ambulatory Visit: Payer: 59 | Admitting: Physician Assistant

## 2021-07-29 DIAGNOSIS — Z029 Encounter for administrative examinations, unspecified: Secondary | ICD-10-CM
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
## 2021-08-10 ENCOUNTER — Telehealth: Payer: Self-pay | Admitting: Physician Assistant

## 2021-08-10 NOTE — Telephone Encounter (Signed)
Pt's wife called in wanting to see if it is ok to increase the pt's rivastigmine?

## 2021-08-25 ENCOUNTER — Telehealth: Payer: Self-pay | Admitting: Physician Assistant

## 2021-08-25 NOTE — Telephone Encounter (Signed)
Patients wife called and stated she needs a letter that states when Eashan was diagnosed with dementia and any other concerns.  She is trying to get guardianship.

## 2021-08-26 NOTE — Telephone Encounter (Signed)
Called patients wife and left message  

## 2021-08-26 NOTE — Telephone Encounter (Signed)
Patient scheduled.

## 2021-08-26 NOTE — Telephone Encounter (Signed)
Patient's daughter, Nelma Rothman, called to request an update on this.

## 2021-08-26 NOTE — Telephone Encounter (Signed)
Patients wife called me back and I went over recommendation for patient from [place]. Patients wife also feels his  Exelon medication needs to go up and he is still having cognitive issues

## 2021-09-01 DIAGNOSIS — G8190 Hemiplegia, unspecified affecting unspecified side: Secondary | ICD-10-CM | POA: Diagnosis not present

## 2021-09-01 DIAGNOSIS — R32 Unspecified urinary incontinence: Secondary | ICD-10-CM | POA: Diagnosis not present

## 2021-09-03 ENCOUNTER — Other Ambulatory Visit: Payer: Self-pay | Admitting: Family Medicine

## 2021-09-03 DIAGNOSIS — G8191 Hemiplegia, unspecified affecting right dominant side: Secondary | ICD-10-CM

## 2021-09-08 ENCOUNTER — Encounter: Payer: Self-pay | Admitting: Physician Assistant

## 2021-09-08 ENCOUNTER — Ambulatory Visit (INDEPENDENT_AMBULATORY_CARE_PROVIDER_SITE_OTHER): Payer: 59 | Admitting: Physician Assistant

## 2021-09-08 VITALS — BP 132/89 | HR 77 | Resp 18 | Wt 139.0 lb

## 2021-09-08 DIAGNOSIS — F0390 Unspecified dementia without behavioral disturbance: Secondary | ICD-10-CM | POA: Diagnosis not present

## 2021-09-08 DIAGNOSIS — F0280 Dementia in other diseases classified elsewhere without behavioral disturbance: Secondary | ICD-10-CM | POA: Diagnosis not present

## 2021-09-08 DIAGNOSIS — G3101 Pick's disease: Secondary | ICD-10-CM | POA: Diagnosis not present

## 2021-09-08 MED ORDER — RIVASTIGMINE TARTRATE 4.5 MG PO CAPS: ORAL_CAPSULE | ORAL | 11 refills | Status: DC

## 2021-09-08 MED ORDER — MEMANTINE HCL 10 MG PO TABS: ORAL_TABLET | ORAL | 11 refills | Status: DC
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
## 2021-09-08 NOTE — Patient Instructions (Addendum)
It was a pleasure to see you today at our office.   Recommendations:  Follow up in  Wed Nov 22 at 11:30   Referral to speech therapy  Await for the MRI results  Increase the rivastigmine to 4.6 mg twice daily    Whom to call:  Memory  decline, memory medications: Call our office [phone]   For psychiatric meds, mood meds: Please have your primary care physician manage these medications.   Counseling regarding caregiver distress, including caregiver depression, anxiety and issues regarding community resources, adult day care programs, adult living facilities, or memory care questions:   Feel free to contact Misty Lisabeth Register, Social Worker at [phone]   For assessment of decision of mental capacity and competency:  Call Dr. Erick Blinks, geriatric psychiatrist at [phone]  For guidance in geriatric dementia issues please call [facility] [phone]  For guidance regarding [facility] and if placement were needed at the facility, contact Sidney Ace, Social Worker tel: [phone]  If you have any severe symptoms of a stroke, or other severe issues such as confusion,severe chills or fever, etc call 911 or go to the ER as you may need to be evaluated further   Feel free to visit Facebook page " [email or web address]" for tips of how to care for people with memory problems.      RECOMMENDATIONS FOR ALL PATIENTS WITH MEMORY PROBLEMS: 1. Continue to exercise (Recommend 30 minutes of walking everyday, or 3 hours every week) 2. Increase social interactions - continue going to [place] and enjoy social gatherings with friends and family 3. Eat healthy, avoid fried foods and eat more fruits and vegetables 4. Maintain adequate blood pressure, blood sugar, and blood cholesterol level. Reducing the risk of stroke and cardiovascular disease also helps promoting better memory. 5. Avoid stressful situations. Live a simple life and avoid aggravations.  Organize your time and prepare for the next day in anticipation. 6. Sleep well, avoid any interruptions of sleep and avoid any distractions in the bedroom that may interfere with adequate sleep quality 7. Avoid sugar, avoid sweets as there is a strong link between excessive sugar intake, diabetes, and cognitive impairment We discussed the Mediterranean diet, which has been shown to help patients reduce the risk of progressive memory disorders and reduces cardiovascular risk. This includes eating fish, eat fruits and green leafy vegetables, nuts like almonds and hazelnuts, walnuts, and also use olive oil. Avoid fast foods and fried foods as much as possible. Avoid sweets and sugar as sugar use has been linked to worsening of memory function.  There is always a concern of gradual progression of memory problems. If this is the case, then we may need to adjust level of care according to patient needs. Support, both to the patient and caregiver, should then be put into place.    The Alzheimer's Association is here all day, every day for people facing Alzheimer's disease through our free 24/7 Helpline: [phone]. The Helpline provides reliable information and support to all those who need assistance, such as individuals living with memory loss, Alzheimer's or other dementia, caregivers, health care professionals and the public.  Our highly trained and knowledgeable staff can help you with: Understanding memory loss, dementia and Alzheimer's  Medications and other treatment options  General information about aging and brain health  Skills to provide quality care and to find the best care from professionals  Legal, financial and living-arrangement decisions Our Helpline also features: Confidential care consultation provided by  master's level clinicians who can help with decision-making support, crisis assistance and education on issues families face every day  Help in a caller's preferred language using  our translation service that features more than 200 languages and dialects  Referrals to local community programs, services and ongoing support     FALL PRECAUTIONS: Be cautious when walking. Scan the area for obstacles that may increase the risk of trips and falls. When getting up in the mornings, sit up at the edge of the bed for a few minutes before getting out of bed. Consider elevating the bed at the head end to avoid drop of blood pressure when getting up. Walk always in a well-lit room (use night lights in the walls). Avoid area rugs or power cords from appliances in the middle of the walkways. Use a walker or a cane if necessary and consider physical therapy for balance exercise. Get your eyesight checked regularly.  FINANCIAL OVERSIGHT: Supervision, especially oversight when making financial decisions or transactions is also recommended.  HOME SAFETY: Consider the safety of the kitchen when operating appliances like stoves, microwave oven, and blender. Consider having supervision and share cooking responsibilities until no longer able to participate in those. Accidents with firearms and other hazards in the house should be identified and addressed as well.   ABILITY TO BE LEFT ALONE: If patient is unable to contact 911 operator, consider using LifeLine, or when the need is there, arrange for someone to stay with patients. Smoking is a fire hazard, consider supervision or cessation. Risk of wandering should be assessed by caregiver and if detected at any point, supervision and safe proof recommendations should be instituted.  MEDICATION SUPERVISION: Inability to self-administer medication needs to be constantly addressed. Implement a mechanism to ensure safe administration of the medications.   DRIVING: Regarding driving, in patients with progressive memory problems, driving will be impaired. We advise to have someone else do the driving if trouble finding directions or if minor accidents  are reported. Independent driving assessment is available to determine safety of driving.   If you are interested in the driving assessment, you can contact the following:  [facility] [employer] [facility] in [place] [phone]  [facility] [phone]  [facility] [phone] or [phone]      Mediterranean Diet A Mediterranean diet refers to food and lifestyle choices that are based on the traditions of countries located on the [employer]. This way of eating has been shown to help prevent certain conditions and improve outcomes for people who have chronic diseases, like kidney disease and heart disease. What are tips for following this plan? Lifestyle  Cook and eat meals together with your family, when possible. Drink enough fluid to keep your urine clear or pale yellow. Be physically active every day. This includes: Aerobic exercise like running or swimming. Leisure activities like gardening, walking, or housework. Get 7-8 hours of sleep each night. If recommended by your health care provider, drink red wine in moderation. This means 1 glass a day for nonpregnant women and 2 glasses a day for men. A glass of wine equals 5 oz (150 mL). Reading food labels  Check the serving size of packaged foods. For foods such as rice and pasta, the serving size refers to the amount of cooked product, not dry. Check the total fat in packaged foods. Avoid foods that have saturated fat or trans fats. Check the ingredients list for added sugars, such as corn syrup. Shopping  At the grocery store, buy most of your food from the areas near the walls of the store. This includes: Fresh fruits and vegetables (produce). Grains, beans, nuts, and seeds. Some of these may be available in unpackaged forms or large amounts (in bulk). Fresh seafood. Poultry and eggs. Low-fat dairy products. Buy whole ingredients instead of prepackaged  foods. Buy fresh fruits and vegetables in-season from local farmers markets. Buy frozen fruits and vegetables in resealable bags. If you do not have access to quality fresh seafood, buy precooked frozen shrimp or canned fish, such as tuna, salmon, or sardines. Buy small amounts of raw or cooked vegetables, salads, or olives from the deli or salad bar at your store. Stock your pantry so you always have certain foods on hand, such as olive oil, canned tuna, canned tomatoes, rice, pasta, and beans. Cooking  Cook foods with extra-virgin olive oil instead of using butter or other vegetable oils. Have meat as a side dish, and have vegetables or grains as your main dish. This means having meat in small portions or adding small amounts of meat to foods like pasta or stew. Use beans or vegetables instead of meat in common dishes like chili or lasagna. Experiment with different cooking methods. Try roasting or broiling vegetables instead of steaming or sauteing them. Add frozen vegetables to soups, stews, pasta, or rice. Add nuts or seeds for added healthy fat at each meal. You can add these to yogurt, salads, or vegetable dishes. Marinate fish or vegetables using olive oil, lemon juice, garlic, and fresh herbs. Meal planning  Plan to eat 1 vegetarian meal one day each week. Try to work up to 2 vegetarian meals, if possible. Eat seafood 2 or more times a week. Have healthy snacks readily available, such as: Vegetable sticks with hummus. Greek yogurt. Fruit and nut trail mix. Eat balanced meals throughout the week. This includes: Fruit: 2-3 servings a day Vegetables: 4-5 servings a day Low-fat dairy: 2 servings a day Fish, poultry, or lean meat: 1 serving a day Beans and legumes: 2 or more servings a week Nuts and seeds: 1-2 servings a day Whole grains: 6-8 servings a day Extra-virgin olive oil: 3-4 servings a day Limit red meat and sweets to only a few servings a month What are my food  choices? Mediterranean diet Recommended Grains: Whole-grain pasta. Brown rice. Bulgar wheat. Polenta. Couscous. Whole-wheat bread. [place]. Vegetables: Artichokes. Beets. Broccoli. Cabbage. Carrots. Eggplant. Green beans. Chard. Kale. Spinach. Onions. Leeks. Peas. Squash. Tomatoes. Peppers. Radishes. Fruits: Apples. Apricots. Avocado. Berries. Bananas. Cherries. Dates. Figs. Grapes. Lemons. Melon. Oranges. Peaches. Plums. Pomegranate. Meats and other protein foods: Beans. Almonds. Sunflower seeds. Pine nuts. Peanuts. [place]. Salmon. Scallops. Shrimp. [place]. Tilapia. Clams. Oysters. Eggs. Dairy: Low-fat milk. Cheese. Greek yogurt. Beverages: Water. Red wine. Herbal tea. Fats and oils: Extra virgin olive oil. Avocado oil. Grape seed oil. Sweets and desserts: [place] yogurt with honey. Baked apples. Poached pears. Trail mix. Seasoning and other foods: Basil. Cilantro. Coriander. Cumin. Mint. Parsley. Sage. Rosemary. Tarragon. Garlic. Oregano. Thyme. Pepper. Balsalmic vinegar. Tahini. Hummus. Tomato sauce. Olives. Mushrooms. Limit these Grains: Prepackaged pasta or rice dishes. Prepackaged cereal with added sugar. Vegetables: Deep fried potatoes (french fries). Fruits: Fruit canned in syrup. Meats and other protein foods: Beef. Pork. Lamb. Poultry with skin. Hot dogs. [place] Salines. Dairy: Ice cream. Sour cream. Whole milk. Beverages: Juice. Sugar-sweetened soft drinks. Beer. Liquor and spirits. Fats and oils: Butter. Canola oil. Vegetable oil. Beef fat (tallow). Lard. Sweets and desserts: Cookies.  Cakes. Pies. Candy. Seasoning and other foods: Mayonnaise. Premade sauces and marinades. The items listed may not be a complete list. Talk with your dietitian about what dietary choices are right for you. Summary The Mediterranean diet includes both food and lifestyle choices. Eat a variety of fresh fruits and vegetables, beans, nuts, seeds, and whole grains. Limit the amount of red meat and sweets that  you eat. Talk with your health care provider about whether it is safe for you to drink red wine in moderation. This means 1 glass a day for nonpregnant women and 2 glasses a day for men. A glass of wine equals 5 oz (150 mL). This information is not intended to replace advice given to you by your health care provider. Make sure you discuss any questions you have with your health care provider. Document Released: 10/01/2015 Document Revised: 11/03/2015 Document Reviewed: 10/01/2015 Elsevier Interactive Patient Education  2017 [employer].

## 2021-09-10 ENCOUNTER — Telehealth: Payer: Self-pay | Admitting: Physician Assistant

## 2021-09-10 NOTE — Telephone Encounter (Signed)
Patients wife would like a call. She would likle [name] to have a medical letter for him to be released from work.

## 2021-09-10 NOTE — Telephone Encounter (Deleted)
j

## 2021-09-10 NOTE — Telephone Encounter (Signed)
Left message on machine for patient to call back.

## 2021-09-13 NOTE — Telephone Encounter (Signed)
Pt was called and informed of results Per Huntley Dec, Talked to Nelma Rothman the husband and he understood.

## 2021-09-14 ENCOUNTER — Ambulatory Visit: Payer: 59 | Attending: Physician Assistant

## 2021-09-14 DIAGNOSIS — R4701 Aphasia: Secondary | ICD-10-CM | POA: Diagnosis not present

## 2021-09-14 DIAGNOSIS — R41841 Cognitive communication deficit: Secondary | ICD-10-CM | POA: Diagnosis not present

## 2021-09-14 NOTE — Therapy (Unsigned)
OUTPATIENT SPEECH LANGUAGE PATHOLOGY APHASIA EVALUATION   Patient Name: [name] MRN: [id number] DOB:Oct 10, 1955, 66 y.o., male Today's Date: 09/15/2021  PCP: Riley Leyden MD REFERRING PROVIDER: Elease Singh    End of Session - 09/14/21 1856     Visit Number 1    Number of Visits 17    Date for SLP Re-Evaluation 11/12/21    Authorization Type [name]    SLP Start Time 1615    SLP Stop Time  1703    SLP Time Calculation (min) 48 min    Activity Tolerance Patient limited by pain;Other (comment)   abdominal pain; limited participation            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   Semantic vs logopenic   Pure hypercholesterolemia    Renal cyst    Shoulder joint pain, right    Wears glasses    Past Surgical History:  Procedure Laterality Date   TONSILLECTOMY  age 67   TRANSURETHRAL RESECTION OF PROSTATE N/A 02/05/2018   Procedure: TRANSURETHRAL RESECTION OF THE PROSTATE (TURP);  Surgeon: Riley Gustin, MD;  Location: [name];  Service: Urology;  Laterality: N/A;   Patient Active Problem List   Diagnosis Date Noted   Increased frequency of urination 03/24/2021   Major neurocognitive disorder 03/24/2021   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 It gets stuck"  Pt accompanied by: significant other  PERTINENT HISTORY: "[name] is a very pleasant 66 y.o. RH male  with  a history of BPH, renal mass/cyst, seen today in follow-up for evaluation of memory loss.  Since his last visit, he had a neuropsychological evaluation by [name], remarkable for concerns the severity of his language impairment , raising particular concerns for the presence of a primary progressive aphasia (PPA) presentation. However, within this presentation, identifying the most likely subtype is quite challenging.  LP was recommended for further differentiation but family politely declined, as "this will not change the course of the treatment ".  He continues to show cognitive decline, unable to perform any testing, patient is showing behavioral changes as well.  Family is to contact their primary care physician, for further discussion regarding behavioral medications such as SSRI in the interim. His speech is more truncated as well, he has difficulty with fluency."   PAIN:  Are you having pain? No  FALLS: Has patient fallen in last 6 months?  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 Please refer to that encounter for the full report and recommendations. Briefly, results suggested severe, diffuse cognitive impairment  across all assessed cognitive domains. Impairment surrounding his ability to comprehend spoken and written language (i.e., receptive language) was particularly striking. This and a lack of appreciation for semantic information represented his two most prominent impairments. However, all performances exhibited significant impairment relative to age-matched peers. The etiology for ongoing impairment is somewhat difficult to discern given diffuse impairment with no patterns of strengths. However, the severity of his language impairment raises particular concerns for the presence of a primary progressive aphasia (PPA) presentation. However, within this presentation, identifying the most likely subtype is quite challenging. Overall, we are faced with considerations of a semantic dementia presentation with some abnormal cognitive findings or a logopenic Alzheimer's presentation with some abnormal anatomical findings."  COGNITION: Overall cognitive status: Impaired Areas of impairment:  Attention: Impaired: Focused, Sustained Memory: Impaired: Working, Teacher, music term Awareness: Impaired: Intellectual and Emergent Executive function: Impaired: Initiation, Error awareness, and Slow processing Behavior: Perseveration, Poor frustration tolerance, and Confabulation Functional deficits: Limited awareness of deficits. Poor short term recall exhibited. See Neuropysch evaluation for more in-depth assessment.   AUDITORY COMPREHENSION: Overall auditory comprehension: Impaired: simple and moderately complex YES/NO questions: Impaired: simple and moderately complex Following directions: Impaired: simple and moderately complex Conversation: Simple Interfering components: awareness, processing speed, and working memory Effective technique: extra processing time, repetition/stressing words, slowed speech, and stressing words  READING COMPREHENSION: Impaired: word  EXPRESSION: verbal and nonverbal- gestures (frequently  pointing to wife to answer; gestures for numbers)  VERBAL EXPRESSION: Level of generative/spontaneous verbalization: conversation Automatic speech: name: intact and social response: impaired  Repetition: Impaired: Word and phrase Naming: Responsive: 0-25% and Confrontation: 0-25% Pragmatics: Impaired: topic appropriateness and topic maintenance Interfering components:  Cognition Effective technique: sentence completion and phonemic cues (rare effectiveness) Non-verbal means of communication: gestures Comments: Limited verbal output exhibited despite max prompting and questioning cues. Frequently pointed to wife to defer answer. Consistently referred to wife as sister. Intermittent gestures (ex: fingers for age and head nods for yes/no questions). Difficult to engage patient in conversation due to suspected anomia and severity of cognitive impairment. Pt very defensive and dismissive of deficits mentioned by wife.   WRITTEN EXPRESSION: Dominant hand: right  Written expression:  Able to write name  (no further testing completed this date)  MOTOR SPEECH: Overall motor speech: Appears intact Phonation: normal Articulation: Appears intact Intelligibility: Intelligible Motor planning: Appears intact   ORAL MOTOR EXAMINATION Overall status: Did not assess but no overt concerns exhibited during eval  STANDARDIZED ASSESSMENTS: QAB: Moderate (5.39)   PATIENT REPORTED OUTCOME MEASURES (PROM): Not completed due to limited participation  and time constraints   TODAY'S TREATMENT:  09-15-21: Educated patient and wife on recommendation for skilled ST intervention to provide education and training of compensatory measures to aid communication and minimize functional decline of current communicative abilities. Emphasized therapy outcomes could be impacted by patient participation and cognitive abilities. Both agreeable to short trial of ST intervention.    PATIENT EDUCATION: Education details:  see above Person educated: Patient and Spouse Education method: Medical illustrator Education comprehension: verbalized understanding, returned demonstration, and needs  further education   GOALS: Goals reviewed with patient? Yes  SHORT TERM GOALS: Target date: 10/12/2021   Pt will attempt verbal or appropriate nonverbal communication in response to simple personally relevant questions/comments for 3/5 opportunities given usual mod A over 2 sessions  Baseline: Goal status: INITIAL  2.  Caregiver will provide appropriate cues to support word retrieval during functional structured tasks given occasional min A over 2 sessions  Baseline:  Goal status: INITIAL  3.  Pt will attempt to communicate via multimodal communication supports to augment verbal expression and support cognitive skills given usual mod A over 2 sessions  Baseline:  Goal status: INITIAL   LONG TERM GOALS: Target date: 11/12/2021    Pt will demonstrate increased participation and attempts to communicate independently in short structured conversations given communication supports and occasional mod A over 2 sessions Baseline:  Goal status: INITIAL  2.  Caregiver will provide appropriate cues to support word retrieval during short structured conversations given rare min A over 2 sessions  Baseline:  Goal status: INITIAL  3.  Caregiver will implement cognitive supports and strategies to aid patient daily functioning and safety given rare min A over 2 sessions  Baseline:  Goal status: INITIAL  ASSESSMENT:  CLINICAL IMPRESSION: "[name]" is a 66 y.o. male who was seen today for major neurocognitive disorder and questionable PPA. Neuropsychologist identified severe cognitive and linguistic impairments. Pt denied any communication or cognitive changes/challenges, with wife reporting "his speech is blurred. It's hard for him to get the words out" which has gradually gotten worse over last 6 months. Per wife, pt is  demonstrating slower speech, trouble switching topics in  conversation, avoiding conversing, and often points to wife to defer answering. Defensive and dismissive behaviors exhibited when wife identified recent changes, which may have impacted overall participation today. Pt continues to work full time at 2 jobs with no cognitive or communication challenges reported related to work. While ongoing disease progression expected, skilled ST intervention is recommended to provide education and training with caregiver to support current functional communication, reduce patient/caregiver frustration, and trial augmentative communication supports and memory aids to maximize patient ability to communicate and function independently.   OBJECTIVE IMPAIRMENTS include memory, expressive language, receptive language, and aphasia. These impairments are limiting patient from household responsibilities and effectively communicating at home and in community. Factors affecting potential to achieve goals and functional outcome are ability to learn/carryover information, cooperation/participation level, medical prognosis, and severity of impairments. Patient will benefit from skilled SLP services to address above impairments and improve overall function.  REHAB POTENTIAL: Fair - questionable participation; severity of deficits; medical prognosis    PLAN: SLP FREQUENCY: 2x/week  SLP DURATION: 8 weeks   PLANNED INTERVENTIONS: Language facilitation, Environmental controls, Cueing hierachy, Cognitive reorganization, Internal/external aids, Functional tasks, Multimodal communication approach, SLP instruction and feedback, Compensatory strategies, and Patient/family education    Gracy Racer, CCC-SLP 09/15/2021, 1:06 PM

## 2021-09-15 ENCOUNTER — Telehealth: Payer: Self-pay | Admitting: Physician Assistant

## 2021-09-15 NOTE — Telephone Encounter (Signed)
A message was left requesting a call back from clinical staff about a medication question.

## 2021-09-15 NOTE — Telephone Encounter (Signed)
Pt wife called and was confused about a medication she just picked up from Pharmacy. I could not help her due to she did not know what medication he is taking and she said one of them are making him sick.. She was going back to Pharmacy to talk to them. Told her to call back if needed.

## 2021-09-17 ENCOUNTER — Telehealth: Payer: Self-pay | Admitting: Physician Assistant

## 2021-09-17 ENCOUNTER — Ambulatory Visit
Admission: RE | Admit: 2021-09-17 | Discharge: 2021-09-17 | Disposition: A | Discharge status: Home / Self Care | Payer: 59 | Source: Ambulatory Visit | Attending: Family Medicine | Admitting: Family Medicine

## 2021-09-17 DIAGNOSIS — G8191 Hemiplegia, unspecified affecting right dominant side: Secondary | ICD-10-CM

## 2021-09-17 DIAGNOSIS — R4182 Altered mental status, unspecified: Secondary | ICD-10-CM | POA: Diagnosis not present

## 2021-09-17 DIAGNOSIS — G9389 Other specified disorders of brain: Secondary | ICD-10-CM | POA: Diagnosis not present

## 2021-09-17 DIAGNOSIS — R2681 Unsteadiness on feet: Secondary | ICD-10-CM | POA: Diagnosis not present

## 2021-09-17 MED ORDER — GADOBENATE DIMEGLUMINE 529 MG/ML IV SOLN
12.0000 mL | Freq: Once | INTRAVENOUS | Status: AC | PRN
  Administered 2021-09-17: 12 mL via INTRAVENOUS

## 2021-09-17 MED ORDER — RIVASTIGMINE TARTRATE 1.5 MG PO CAPS: 1.5000 mg | ORAL_CAPSULE | Freq: Two times a day (BID) | ORAL | 1 refills | Status: DC

## 2021-09-17 NOTE — Telephone Encounter (Signed)
Telephone call back to patient wife, she do not know the name but it is the medication Huntley Dec increased at his last visit with her.   It looks like per last note 09/08/21, Increase rivastigmine to 4.5 mg twice daily.  Side effects were discussed. Wife wants to know if patient should stop medication.

## 2021-09-17 NOTE — Telephone Encounter (Signed)
Go back to 1.5mg  BID, pls send updated Rx, thanks

## 2021-09-17 NOTE — Telephone Encounter (Signed)
Patient's wife called for the second time this week.  She left a voice mail requesting to speak with clinical staff about a new medication that is causing stomach upset.

## 2021-09-17 NOTE — Telephone Encounter (Signed)
Patient wife advised of Dr.Aqunio note, pls let wife know to stop the 4.5mg  rivastigmine.   Wife would like the patient to go on the 1.5 mg cap.  Per Dr.Aqunio,  rivastigmine 1.5mg  BID with refills until appt with Huntley Dec, thanks.  Patient needs schedule a visit with Huntley Dec

## 2021-09-21 ENCOUNTER — Ambulatory Visit: Payer: 59 | Attending: Physician Assistant | Admitting: Speech Pathology

## 2021-09-21 DIAGNOSIS — R41841 Cognitive communication deficit: Secondary | ICD-10-CM | POA: Insufficient documentation

## 2021-09-21 DIAGNOSIS — R4701 Aphasia: Secondary | ICD-10-CM | POA: Insufficient documentation

## 2021-09-21 NOTE — Therapy (Signed)
OUTPATIENT SPEECH LANGUAGE PATHOLOGY TREATMENT   Patient Name: [name] MRN: [id number] DOB:August 28, 1955, 66 y.o., male Today's Date: 09/21/2021  PCP: Riley Coe MD REFERRING PROVIDER: Marcos Eke, PA-C    End of Session - 09/21/21 1551     Visit Number 2    Number of Visits 17    Date for SLP Re-Evaluation 11/12/21    Authorization Type UHC/BCBS    SLP Start Time 1547   Pt arrived late to session   SLP Stop Time  1619    SLP Time Calculation (min) 32 min    Activity Tolerance Other (comment)   abdominal pain; limited participation             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   Semantic vs logopenic   Pure hypercholesterolemia    Renal cyst    Shoulder joint pain, right    Wears glasses    Past Surgical History:  Procedure Laterality Date   TONSILLECTOMY  age 10   TRANSURETHRAL RESECTION OF PROSTATE N/A 02/05/2018   Procedure: TRANSURETHRAL RESECTION OF THE PROSTATE (TURP);  Surgeon: Riley Gauze, MD;  Location: [facility];  Service: Urology;  Laterality: N/A;   Patient Active Problem List   Diagnosis Date Noted   Increased frequency of urination 03/24/2021   Major neurocognitive disorder 03/24/2021   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   Fatty liver    Hardening of the aorta (main artery of the heart)    Insomnia    Pain in right hip    Personality disorder    Pure hypercholesterolemia    Renal cyst    Shoulder joint pain, right    Male erectile dysfunction 12/21/2019   Benign prostatic hyperplasia without lower urinary tract symptoms 02/05/2018     ONSET DATE: ~6 months  REFERRING DIAG: F03.90 (ICD-10-CM) - Major neurocognitive disorder   THERAPY DIAG:  No diagnosis found.  Rationale for Evaluation and Treatment Rehabilitation  SUBJECTIVE:   SUBJECTIVE STATEMENT: "I come from work" Pt accompanied by: significant other  PAIN:  Are you having pain? No and Yes: NPRS scale: 6/10 Pain location: R knee, stomach    OBJECTIVE:   TODAY'S TREATMENT:  09-21-21: Pt attends session with wife. Given usual repetition, pt able to engage in conversation this date. Pt answering questions with appropriate response in 75% of trials (based on wife confirming pt response). Overall pt demonstrating increased interaction this date which wife reports is due to medication change. Initiate caregiver education on supportive communication techniques of using simple language, repeating key words, supporting with written cues. Wife demonstrating repetition strategy, however needs additional training to avoid increasing frustration with strategy use. Trial cueing strategies for anomia- semantic cueing and phonemic cueing ineffective this date. When anomia occurs pt reluctant to continue communicating. Wife and pt report continues success at work, report pt only briefly at home.   09-15-21: Educated patient and wife on recommendation for skilled ST intervention to provide education and training of compensatory measures to aid communication and minimize functional decline of current communicative abilities. Emphasized therapy outcomes could  be impacted by patient participation and cognitive abilities. Both agreeable to short trial of ST intervention.    PATIENT EDUCATION: Education details: see above Person educated: Patient and Spouse Education method: Medical illustrator Education comprehension: verbalized understanding, returned demonstration, and needs further education   GOALS: Goals reviewed with patient? Yes  SHORT TERM GOALS: Target  date: 10/12/2021   Pt will attempt verbal or appropriate nonverbal communication in response to simple personally relevant questions/comments for 3/5 opportunities given usual mod A over 2 sessions  Baseline: Goal status: IN PROGRESS  2.  Caregiver will provide appropriate cues to support word retrieval during functional structured tasks given occasional min A over 2 sessions  Baseline:  Goal status: IN PROGRESS  3.  Pt will attempt to communicate via multimodal communication supports to augment verbal expression and support cognitive skills given usual mod A over 2 sessions  Baseline:  Goal status: IN PROGRESS   LONG TERM GOALS: Target date: 11/12/2021    Pt will demonstrate increased participation and attempts to communicate independently in short structured conversations given communication supports and occasional mod A over 2 sessions Baseline:  Goal status: IN PROGRESS  2.  Caregiver will provide appropriate cues to support word retrieval during short structured conversations given rare min A over 2 sessions  Baseline:  Goal status: IN PROGRESS  3.  Caregiver will implement cognitive supports and strategies to aid patient daily functioning and safety given rare min A over 2 sessions  Baseline:  Goal status: IN PROGRESS  ASSESSMENT:  CLINICAL IMPRESSION: "[name]" is a 66 y.o. male who was seen today for major neurocognitive disorder and questionable PPA. Neuropsychologist identified severe cognitive and linguistic impairments. Pt denied any communication or cognitive changes/challenges, with wife reporting "his speech is blurred. It's hard for him to get the words out" which has gradually gotten worse over last 6 months. Per wife, pt is demonstrating slower speech, trouble switching topics in  conversation, avoiding conversing, and often points to wife to defer answering. Defensive and dismissive behaviors exhibited when wife identified recent changes, which may have impacted overall  participation today. Pt continues to work full time at 2 jobs with no cognitive or communication challenges reported related to work. While ongoing disease progression expected, skilled ST intervention is recommended to provide education and training with caregiver to support current functional communication, reduce patient/caregiver frustration, and trial augmentative communication supports and memory aids to maximize patient ability to communicate and function independently.   OBJECTIVE IMPAIRMENTS include memory, expressive language, receptive language, and aphasia. These impairments are limiting patient from household responsibilities and effectively communicating at home and in community. Factors affecting potential to achieve goals and functional outcome are ability to learn/carryover information, cooperation/participation level, medical prognosis, and severity of impairments. Patient will benefit from skilled SLP services to address above impairments and improve overall function.  REHAB POTENTIAL: Fair - questionable participation; severity of deficits; medical prognosis    PLAN: SLP FREQUENCY: 2x/week  SLP DURATION: 8 weeks   PLANNED INTERVENTIONS: Language facilitation, Environmental controls, Cueing hierachy, Cognitive reorganization, Internal/external aids, Functional tasks, Multimodal communication approach, SLP instruction and feedback, Compensatory strategies, and Patient/family education    Maia Breslow, CCC-SLP 09/21/2021, 4:25 PM

## 2021-09-22 ENCOUNTER — Encounter: Payer: Self-pay | Admitting: Speech Pathology

## 2021-09-22 ENCOUNTER — Ambulatory Visit: Payer: 59 | Admitting: Speech Pathology

## 2021-09-22 NOTE — Therapy (Signed)
[facility] [address] [place], [place], [address] Phone: [phone]   Fax:  [phone]  Patient Details  Name: [name] MRN: [id number] Date of Birth: January 28, 1956 Referring Provider:  No ref. provider found  Encounter Date: 09/22/2021  Pt does not arrive for today's scheduled therapy session. Wife arrives, reports she has called pt but he hung up on her and is now not answering phone. Spouse believes pt will not be committed to coming to and participating in therapy, thus has requested d/c. SLP advised pt will need new referral to return for further ST services.    SPEECH THERAPY DISCHARGE SUMMARY  Visits from Start of Care: 2  Current functional level related to goals / functional outcomes: See below; limited progress d/t only one therapy session attended prior to d/c.   Remaining deficits: Receptive and expressive communication impairment, cognitive impairment    Education / Equipment: Supportive communication partner techniques   Patient agrees to discharge. Patient goals were not met. Patient is being discharged due to  pt's spouses request.  SHORT TERM GOALS: Target date: 10/12/2021    Pt will attempt verbal or appropriate nonverbal communication in response to simple personally relevant questions/comments for 3/5 opportunities given usual mod A over 2 sessions  Baseline: 09/21/21 Goal status: not met   2.  Caregiver will provide appropriate cues to support word retrieval during functional structured tasks given occasional min A over 2 sessions  Baseline:  Goal status: not met   3.  Pt will attempt to communicate via multimodal communication supports to augment verbal expression and support cognitive skills given usual mod A over 2 sessions  Baseline:  Goal status: not met  LONG TERM GOALS: Target date: 11/12/2021     Pt will demonstrate increased participation and attempts to communicate  independently in short structured conversations given communication supports and occasional mod A over 2 sessions Baseline:  Goal status: not met   2.  Caregiver will provide appropriate cues to support word retrieval during short structured conversations given rare min A over 2 sessions  Baseline:  Goal status: not met   3.  Caregiver will implement cognitive supports and strategies to aid patient daily functioning and safety given rare min A over 2 sessions  Baseline:  Goal status: not met  Su Monks, CCC-SLP 09/22/2021, 4:00 PM  [facility] [address] [place], [place], [address] Phone: [phone]   Fax:  [phone]

## 2021-09-23 ENCOUNTER — Telehealth: Payer: Self-pay | Admitting: Physician Assistant

## 2021-09-23 NOTE — Telephone Encounter (Signed)
Pt's wife called in and left a message with the access nurse. She would like a new referral to start speech therapy again. He has not been coming.

## 2021-09-24 NOTE — Telephone Encounter (Signed)
Yes to speech therapy. Thank you

## 2021-09-24 NOTE — Telephone Encounter (Signed)
Notes reviewed, it was only 2 days ago that he did not come for speech therapy. Has he agreed to come now with new speech therapy order?

## 2021-09-24 NOTE — Telephone Encounter (Signed)
Done/completed 

## 2021-09-24 NOTE — Telephone Encounter (Signed)
Ok for re-order, thanks

## 2021-09-28 ENCOUNTER — Ambulatory Visit: Payer: 59

## 2021-09-29 ENCOUNTER — Ambulatory Visit: Payer: 59 | Admitting: Speech Pathology

## 2021-10-05 ENCOUNTER — Encounter: Payer: 59 | Admitting: Speech Pathology

## 2021-10-05 DIAGNOSIS — M25561 Pain in right knee: Secondary | ICD-10-CM | POA: Diagnosis not present

## 2021-10-05 DIAGNOSIS — M7020 Olecranon bursitis, unspecified elbow: Secondary | ICD-10-CM | POA: Diagnosis not present

## 2021-10-06 ENCOUNTER — Encounter: Payer: 59 | Admitting: Speech Pathology

## 2021-10-12 ENCOUNTER — Encounter: Payer: 59 | Admitting: Speech Pathology

## 2021-10-13 ENCOUNTER — Encounter: Payer: 59 | Admitting: Speech Pathology

## 2021-10-18 ENCOUNTER — Other Ambulatory Visit: Payer: Self-pay | Admitting: Family Medicine

## 2021-10-18 ENCOUNTER — Ambulatory Visit
Admission: RE | Admit: 2021-10-18 | Discharge: 2021-10-18 | Disposition: A | Discharge status: Home / Self Care | Payer: 59 | Source: Ambulatory Visit | Attending: Family Medicine | Admitting: Family Medicine

## 2021-10-18 DIAGNOSIS — M25551 Pain in right hip: Secondary | ICD-10-CM

## 2021-10-18 DIAGNOSIS — M25561 Pain in right knee: Secondary | ICD-10-CM

## 2021-11-03 ENCOUNTER — Telehealth: Payer: Self-pay | Admitting: Physician Assistant

## 2021-11-03 DIAGNOSIS — R2689 Other abnormalities of gait and mobility: Secondary | ICD-10-CM | POA: Diagnosis not present

## 2021-11-03 NOTE — Telephone Encounter (Signed)
I advised to contact PCP and they thanked me for calling.

## 2021-11-03 NOTE — Telephone Encounter (Signed)
Pts wife said pt cant remember things, is falling a lot, ignores his wife. Wife wants to know if he can go to rehab to be evaluated. She thinks he needs to be on medicine. She wants to know if they can come in sooner than next appt to speak with sara.

## 2021-11-05 DIAGNOSIS — R269 Unspecified abnormalities of gait and mobility: Secondary | ICD-10-CM | POA: Diagnosis not present

## 2021-11-05 DIAGNOSIS — M6281 Muscle weakness (generalized): Secondary | ICD-10-CM | POA: Diagnosis not present

## 2021-12-03 ENCOUNTER — Telehealth: Payer: Self-pay | Admitting: Physician Assistant

## 2021-12-03 NOTE — Telephone Encounter (Signed)
Is the jury duty for her or for him? If for him we can do excuse, but we need the actual letter. For her we cannot, she is not the patient thanks

## 2021-12-03 NOTE — Telephone Encounter (Signed)
Patient wife called and states that she is going to court on 12-07-21 and needs a letter to take to court by 12-06-21. She needs the letter to states when he was dx, what is going with patient and that he can not take care of himself and make decision

## 2021-12-03 NOTE — Telephone Encounter (Signed)
Will hold till Monday and give to patient

## 2021-12-03 NOTE — Telephone Encounter (Signed)
His condition, she is getting court guardianship. Can you dictate letter and have it ready Monday, thank you

## 2021-12-07 ENCOUNTER — Encounter: Payer: Self-pay | Admitting: Physician Assistant

## 2021-12-07 NOTE — Telephone Encounter (Signed)
Patient's wife called and said court date is tomorrow. Can she please pick up the letter today?

## 2021-12-07 NOTE — Telephone Encounter (Signed)
Patient called asking if the letter is ready to be picked up. She was told it would be ready on 12/07/21.

## 2021-12-07 NOTE — Progress Notes (Signed)
     This is to inform you that [name]  has been under my care in the Neurology clinic since 03/01/21 for Alzheimer's dementia and Primary progressive Aphasia . As a result of examining her/him over this year, it is my medical opinion that Geoffry Bannister  lacks the capacity to act prudently or effectively with regard to his  financial affairs, take care of himself or to drive. Her/his medical condition is highly unlikely to improve over time. Respectfully, Sharene Butters, PA-C 12/07/2021

## 2021-12-08 NOTE — Telephone Encounter (Signed)
Put letter up front for wife to pick up

## 2021-12-14 DIAGNOSIS — B353 Tinea pedis: Secondary | ICD-10-CM | POA: Diagnosis not present

## 2021-12-14 DIAGNOSIS — M25571 Pain in right ankle and joints of right foot: Secondary | ICD-10-CM | POA: Diagnosis not present

## 2021-12-14 DIAGNOSIS — M79671 Pain in right foot: Secondary | ICD-10-CM | POA: Diagnosis not present

## 2021-12-14 DIAGNOSIS — M79672 Pain in left foot: Secondary | ICD-10-CM | POA: Diagnosis not present

## 2021-12-17 ENCOUNTER — Telehealth: Payer: Self-pay | Admitting: *Deleted

## 2021-12-17 NOTE — Telephone Encounter (Signed)
Wife is calling for problems from an injection  given but after looking in system, had wrong office, explained to her that he has never been seen here. She verbalized understanding.

## 2021-12-29 DIAGNOSIS — L600 Ingrowing nail: Secondary | ICD-10-CM | POA: Diagnosis not present

## 2021-12-29 DIAGNOSIS — M79674 Pain in right toe(s): Secondary | ICD-10-CM | POA: Diagnosis not present

## 2021-12-29 DIAGNOSIS — M79675 Pain in left toe(s): Secondary | ICD-10-CM | POA: Diagnosis not present

## 2021-12-29 DIAGNOSIS — B351 Tinea unguium: Secondary | ICD-10-CM | POA: Diagnosis not present

## 2021-12-29 DIAGNOSIS — M25571 Pain in right ankle and joints of right foot: Secondary | ICD-10-CM | POA: Diagnosis not present

## 2022-01-07 DIAGNOSIS — M21611 Bunion of right foot: Secondary | ICD-10-CM | POA: Diagnosis not present

## 2022-01-07 DIAGNOSIS — M71571 Other bursitis, not elsewhere classified, right ankle and foot: Secondary | ICD-10-CM | POA: Diagnosis not present

## 2022-01-12 ENCOUNTER — Ambulatory Visit (INDEPENDENT_AMBULATORY_CARE_PROVIDER_SITE_OTHER): Payer: 59 | Admitting: Physician Assistant

## 2022-01-12 ENCOUNTER — Encounter: Payer: Self-pay | Admitting: Physician Assistant

## 2022-01-12 VITALS — BP 146/86 | HR 84 | Resp 18 | Ht 68.0 in | Wt 146.0 lb

## 2022-01-12 DIAGNOSIS — G3101 Pick's disease: Secondary | ICD-10-CM | POA: Diagnosis not present

## 2022-01-12 DIAGNOSIS — F0390 Unspecified dementia without behavioral disturbance: Secondary | ICD-10-CM

## 2022-01-12 DIAGNOSIS — F0280 Dementia in other diseases classified elsewhere without behavioral disturbance: Secondary | ICD-10-CM | POA: Diagnosis not present

## 2022-01-12 MED ORDER — QUETIAPINE FUMARATE 25 MG PO TABS: 25.0000 mg | ORAL_TABLET | Freq: Every day | ORAL | 3 refills | Status: DC

## 2022-01-12 MED ORDER — QUETIAPINE FUMARATE 25 MG PO TABS: 25.0000 mg | ORAL_TABLET | Freq: Every day | ORAL | 11 refills | Status: DC

## 2022-01-12 NOTE — Patient Instructions (Addendum)
It was a pleasure to see you today at our office.   Recommendations:  Follow up in  June 4  at 11:30   rivastigmine to 1.5  mg twice daily  Memantine 10 mg 2 times a day  Add Seroquel 25 mg at night for mood    Whom to call:  Memory  decline, memory medications: Call our office [phone]   For psychiatric meds, mood meds: Please have your primary care physician manage these medications.   Counseling regarding caregiver distress, including caregiver depression, anxiety and issues regarding community resources, adult day care programs, adult living facilities, or memory care questions:   Feel free to contact Misty Lisabeth Register, Social Worker at [phone]   For assessment of decision of mental capacity and competency:  Call Dr. Erick Blinks, geriatric psychiatrist at [phone]  For guidance in geriatric dementia issues please call [facility] [phone]  For guidance regarding [facility] and if placement were needed at the facility, contact Sidney Ace, Social Worker tel: [phone]  If you have any severe symptoms of a stroke, or other severe issues such as confusion,severe chills or fever, etc call 911 or go to the ER as you may need to be evaluated further   Feel free to visit Facebook page " [email or web address]" for tips of how to care for people with memory problems.      RECOMMENDATIONS FOR ALL PATIENTS WITH MEMORY PROBLEMS: 1. Continue to exercise (Recommend 30 minutes of walking everyday, or 3 hours every week) 2. Increase social interactions - continue going to [place] and enjoy social gatherings with friends and family 3. Eat healthy, avoid fried foods and eat more fruits and vegetables 4. Maintain adequate blood pressure, blood sugar, and blood cholesterol level. Reducing the risk of stroke and cardiovascular disease also helps promoting better memory. 5. Avoid stressful situations. Live a simple life and avoid aggravations. Organize  your time and prepare for the next day in anticipation. 6. Sleep well, avoid any interruptions of sleep and avoid any distractions in the bedroom that may interfere with adequate sleep quality 7. Avoid sugar, avoid sweets as there is a strong link between excessive sugar intake, diabetes, and cognitive impairment We discussed the Mediterranean diet, which has been shown to help patients reduce the risk of progressive memory disorders and reduces cardiovascular risk. This includes eating fish, eat fruits and green leafy vegetables, nuts like almonds and hazelnuts, walnuts, and also use olive oil. Avoid fast foods and fried foods as much as possible. Avoid sweets and sugar as sugar use has been linked to worsening of memory function.  There is always a concern of gradual progression of memory problems. If this is the case, then we may need to adjust level of care according to patient needs. Support, both to the patient and caregiver, should then be put into place.    The [employer] is here all day, every day for people facing Alzheimer's disease through our free 24/7 Helpline: [phone]. The Helpline provides reliable information and support to all those who need assistance, such as individuals living with memory loss, Alzheimer's or other dementia, caregivers, health care professionals and the public.  Our highly trained and knowledgeable staff can help you with: Understanding memory loss, dementia and Alzheimer's  Medications and other treatment options  General information about aging and brain health  Skills to provide quality care and to find the best care from professionals  Legal, financial and living-arrangement decisions Our Helpline also features:  Confidential care consultation provided by master's level clinicians who can help with decision-making support, crisis assistance and education on issues families face every day  Help in a caller's preferred language using our  translation service that features more than 200 languages and dialects  Referrals to local community programs, services and ongoing support     FALL PRECAUTIONS: Be cautious when walking. Scan the area for obstacles that may increase the risk of trips and falls. When getting up in the mornings, sit up at the edge of the bed for a few minutes before getting out of bed. Consider elevating the bed at the head end to avoid drop of blood pressure when getting up. Walk always in a well-lit room (use night lights in the walls). Avoid area rugs or power cords from appliances in the middle of the walkways. Use a walker or a cane if necessary and consider physical therapy for balance exercise. Get your eyesight checked regularly.  FINANCIAL OVERSIGHT: Supervision, especially oversight when making financial decisions or transactions is also recommended.  HOME SAFETY: Consider the safety of the kitchen when operating appliances like stoves, microwave oven, and blender. Consider having supervision and share cooking responsibilities until no longer able to participate in those. Accidents with firearms and other hazards in the house should be identified and addressed as well.   ABILITY TO BE LEFT ALONE: If patient is unable to contact 911 operator, consider using LifeLine, or when the need is there, arrange for someone to stay with patients. Smoking is a fire hazard, consider supervision or cessation. Risk of wandering should be assessed by caregiver and if detected at any point, supervision and safe proof recommendations should be instituted.  MEDICATION SUPERVISION: Inability to self-administer medication needs to be constantly addressed. Implement a mechanism to ensure safe administration of the medications.   DRIVING: Regarding driving, in patients with progressive memory problems, driving will be impaired. We advise to have someone else do the driving if trouble finding directions or if minor accidents are  reported. Independent driving assessment is available to determine safety of driving.   If you are interested in the driving assessment, you can contact the following:  [employer] in [place] [phone]  [facility] [phone]  [facility] [phone] or [phone]      Mediterranean Diet A Mediterranean diet refers to food and lifestyle choices that are based on the traditions of countries located on the [employer]. This way of eating has been shown to help prevent certain conditions and improve outcomes for people who have chronic diseases, like kidney disease and heart disease. What are tips for following this plan? Lifestyle  Cook and eat meals together with your family, when possible. Drink enough fluid to keep your urine clear or pale yellow. Be physically active every day. This includes: Aerobic exercise like running or swimming. Leisure activities like gardening, walking, or housework. Get 7-8 hours of sleep each night. If recommended by your health care provider, drink red wine in moderation. This means 1 glass a day for nonpregnant women and 2 glasses a day for men. A glass of wine equals 5 oz (150 mL). Reading food labels  Check the serving size of packaged foods. For foods such as rice and pasta, the serving size refers to the amount of cooked product, not dry. Check the total fat in packaged foods. Avoid foods that have saturated fat or trans fats. Check the ingredients list for added sugars, such  as corn syrup. Shopping  At the grocery store, buy most of your food from the areas near the walls of the store. This includes: Fresh fruits and vegetables (produce). Grains, beans, nuts, and seeds. Some of these may be available in unpackaged forms or large amounts (in bulk). Fresh seafood. Poultry and eggs. Low-fat dairy products. Buy whole ingredients instead of prepackaged  foods. Buy fresh fruits and vegetables in-season from local farmers markets. Buy frozen fruits and vegetables in resealable bags. If you do not have access to quality fresh seafood, buy precooked frozen shrimp or canned fish, such as tuna, salmon, or sardines. Buy small amounts of raw or cooked vegetables, salads, or olives from the deli or salad bar at your store. Stock your pantry so you always have certain foods on hand, such as olive oil, canned tuna, canned tomatoes, rice, pasta, and beans. Cooking  Cook foods with extra-virgin olive oil instead of using butter or other vegetable oils. Have meat as a side dish, and have vegetables or grains as your main dish. This means having meat in small portions or adding small amounts of meat to foods like pasta or stew. Use beans or vegetables instead of meat in common dishes like chili or lasagna. Experiment with different cooking methods. Try roasting or broiling vegetables instead of steaming or sauteing them. Add frozen vegetables to soups, stews, pasta, or rice. Add nuts or seeds for added healthy fat at each meal. You can add these to yogurt, salads, or vegetable dishes. Marinate fish or vegetables using olive oil, lemon juice, garlic, and fresh herbs. Meal planning  Plan to eat 1 vegetarian meal one day each week. Try to work up to 2 vegetarian meals, if possible. Eat seafood 2 or more times a week. Have healthy snacks readily available, such as: Vegetable sticks with hummus. Greek yogurt. Fruit and nut trail mix. Eat balanced meals throughout the week. This includes: Fruit: 2-3 servings a day Vegetables: 4-5 servings a day Low-fat dairy: 2 servings a day Fish, poultry, or lean meat: 1 serving a day Beans and legumes: 2 or more servings a week Nuts and seeds: 1-2 servings a day Whole grains: 6-8 servings a day Extra-virgin olive oil: 3-4 servings a day Limit red meat and sweets to only a few servings a month What are my food  choices? Mediterranean diet Recommended Grains: Whole-grain pasta. Brown rice. Bulgar wheat. Polenta. Couscous. Whole-wheat bread. [place]. Vegetables: Artichokes. Beets. Broccoli. Cabbage. Carrots. Eggplant. Green beans. Chard. Kale. Spinach. Onions. Leeks. Peas. Squash. Tomatoes. Peppers. Radishes. Fruits: Apples. Apricots. Avocado. Berries. Bananas. Cherries. Dates. Figs. Grapes. Lemons. Melon. Oranges. Peaches. Plums. Pomegranate. Meats and other protein foods: Beans. Almonds. Sunflower seeds. Pine nuts. Peanuts. [place]. Salmon. Scallops. Shrimp. [place]. Tilapia. Clams. Oysters. Eggs. Dairy: Low-fat milk. Cheese. Greek yogurt. Beverages: Water. Red wine. Herbal tea. Fats and oils: Extra virgin olive oil. Avocado oil. Grape seed oil. Sweets and desserts: [place] yogurt with honey. Baked apples. Poached pears. Trail mix. Seasoning and other foods: Basil. Cilantro. Coriander. Cumin. Mint. Parsley. Sage. Rosemary. Tarragon. Garlic. Oregano. Thyme. Pepper. Balsalmic vinegar. Tahini. Hummus. Tomato sauce. Olives. Mushrooms. Limit these Grains: Prepackaged pasta or rice dishes. Prepackaged cereal with added sugar. Vegetables: Deep fried potatoes (french fries). Fruits: Fruit canned in syrup. Meats and other protein foods: Beef. Pork. Lamb. Poultry with skin. Hot dogs. Bernie[place] Salines. Dairy: Ice cream. Sour cream. Whole milk. Beverages: Juice. Sugar-sweetened soft drinks. Beer. Liquor and spirits. Fats and oils: Butter. Canola oil. Vegetable oil. Beef fat (tallow).  Lard. Sweets and desserts: Cookies. Cakes. Pies. Candy. Seasoning and other foods: Mayonnaise. Premade sauces and marinades. The items listed may not be a complete list. Talk with your dietitian about what dietary choices are right for you. Summary The Mediterranean diet includes both food and lifestyle choices. Eat a variety of fresh fruits and vegetables, beans, nuts, seeds, and whole grains. Limit the amount of red meat and sweets that  you eat. Talk with your health care provider about whether it is safe for you to drink red wine in moderation. This means 1 glass a day for nonpregnant women and 2 glasses a day for men. A glass of wine equals 5 oz (150 mL). This information is not intended to replace advice given to you by your health care provider. Make sure you discuss any questions you have with your health care provider. Document Released: 10/01/2015 Document Revised: 11/03/2015 Document Reviewed: 10/01/2015 Elsevier Interactive Patient Education  2017 [employer].
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
## 2022-01-20 ENCOUNTER — Telehealth: Payer: Self-pay | Admitting: Physician Assistant

## 2022-01-20 NOTE — Telephone Encounter (Signed)
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

## 2022-01-20 NOTE — Telephone Encounter (Signed)
Advised and she is going to speak with the lawyer that she already has in place. Thanked me for calling on behalf of [name], [place]

## 2022-01-21 ENCOUNTER — Ambulatory Visit (INDEPENDENT_AMBULATORY_CARE_PROVIDER_SITE_OTHER): Payer: BC Managed Care – PPO | Admitting: Podiatry

## 2022-01-21 ENCOUNTER — Other Ambulatory Visit: Payer: Self-pay | Admitting: Podiatry

## 2022-01-21 ENCOUNTER — Encounter: Payer: Self-pay | Admitting: Podiatry

## 2022-01-21 ENCOUNTER — Ambulatory Visit (INDEPENDENT_AMBULATORY_CARE_PROVIDER_SITE_OTHER): Payer: BC Managed Care – PPO

## 2022-01-21 DIAGNOSIS — M2011 Hallux valgus (acquired), right foot: Secondary | ICD-10-CM | POA: Diagnosis not present

## 2022-01-21 DIAGNOSIS — R2681 Unsteadiness on feet: Secondary | ICD-10-CM

## 2022-01-21 DIAGNOSIS — M79671 Pain in right foot: Secondary | ICD-10-CM

## 2022-01-21 DIAGNOSIS — R0989 Other specified symptoms and signs involving the circulatory and respiratory systems: Secondary | ICD-10-CM | POA: Diagnosis not present

## 2022-01-21 MED ORDER — DICLOFENAC SODIUM 1 % EX GEL: 2.0000 g | Freq: Four times a day (QID) | CUTANEOUS | 2 refills | Status: DC

## 2022-01-21 NOTE — Progress Notes (Unsigned)
error 

## 2022-01-21 NOTE — Patient Instructions (Addendum)
For inserts I like POWERSTEPS, SUPERFEET, [employer]ETREX.   ---  Bunion A bunion (hallux valgus) is a bump that forms slowly on the inner side of the big toe joint. It occurs when the big toe turns toward the second toe. Bunions may be small at first, but they often get larger over time. They can make walking painful. What are the causes? This condition may be caused by: Wearing narrow or pointed shoes that force the big toe to press against the other toes. Abnormal foot development that causes the foot to roll inward. Changes in the foot that are caused by certain diseases, such as rheumatoid arthritis or polio. A foot injury. What increases the risk? The following factors may make you more likely to develop this condition: Wearing shoes that squeeze the toes together. Having certain diseases, such as: Rheumatoid arthritis. Polio. Cerebral palsy. Having family members who have bunions. Being born with abnormally shaped feet (a foot deformity), such as flat feet or low arches. Doing activities that put a lot of pressure on the feet, such as ballet dancing. What are the signs or symptoms?  The main symptom of this condition is a bump on your big toe that you can notice. Other symptoms may include: Pain. Redness and inflammation around your big toe. Thick or hardened skin on your big toe or between your toes. Stiffness or loss of motion in your big toe. Trouble with walking. How is this diagnosed? This condition may be diagnosed based on your symptoms, medical history, and activities. You may also have tests and imaging, such as: X-rays. These allow your health care provider to check the position of the bones in your foot and look for damage to your joint. They also help your health care provider determine the severity of your bunion and the best way to treat it. Joint aspiration. In this test, a sample of fluid is removed from the toe joint. This test may be done if you are in a lot of pain.  It helps rule out diseases that cause painful swelling of the joints, such as arthritis or gout. How is this treated? Treatment depends on the severity of your symptoms. The goal of treatment is to relieve symptoms and prevent your bunion from getting worse. Your health care provider may recommend: Wearing shoes that have a wide toe box, or using bunion pads to cushion the affected area. Taping your toes together to keep them in a normal position. Placing a device inside your shoe (orthotic device) to help reduce pressure on your toe joint. Taking medicine to ease pain and inflammation. Putting ice or heat on the affected area. Doing stretching exercises. Surgery, for severe cases. Follow these instructions at home: Managing pain, stiffness, and swelling     If directed, put ice on the painful area. To do this: Put ice in a plastic bag. Place a towel between your skin and the bag. Leave the ice on for 20 minutes, 2-3 times a day. Remove the ice if your skin turns bright red. This is very important. If you cannot feel pain, heat, or cold, you have a greater risk of damage to the area. If directed, apply heat to the affected area before you exercise. Use the heat source that your health care provider recommends, such as a moist heat pack or a heating pad. Place a towel between your skin and the heat source. Leave the heat on for 20-30 minutes. Remove the heat if your skin turns bright red. This   is especially important if you are unable to feel pain, heat, or cold. You have a greater risk of getting burned. General instructions Do exercises as told by your health care provider. Support your toe joint with proper footwear, shoe padding, or taping as told by your health care provider. Take over-the-counter and prescription medicines only as told by your health care provider. Do not use any products that contain nicotine or tobacco, such as cigarettes, e-cigarettes, and chewing tobacco. If you  need help quitting, ask your health care provider. Keep all follow-up visits. This is important. Contact a health care provider if: Your symptoms get worse. Your symptoms do not improve in 2 weeks. Get help right away if: You have severe pain and trouble with walking. Summary A bunion is a bump on the inner side of the big toe joint that forms when the big toe turns toward the second toe. Bunions can make walking painful. Treatment depends on the severity of your symptoms. Support your toe joint with proper footwear, shoe padding, or taping as told by your health care provider. This information is not intended to replace advice given to you by your health care provider. Make sure you discuss any questions you have with your health care provider. Document Revised: 06/14/2019 Document Reviewed: 06/14/2019 Elsevier Patient Education  2023 [employer]  

## 2022-01-24 NOTE — Progress Notes (Signed)
Subjective:   Patient ID: Earney Mallet, male   DOB: 66 y.o.   MRN: [id number]   HPI Chief Complaint  Patient presents with   Foot Pain    1st MPJ right - bunion deformity x years, aching x 1 month, shoes bothersome, tries to wear soft shoes, did see podiatry-injected twice - no help   New Patient (Initial Visit)   66 year old male presents the office with his family for evaluation of pain along the first MPJ.  Previously saw another podiatrist he has had 2 injections which was not helpful.  He does get some swelling to his feet.  He was also concerned with his gait.  No recent injuries that he reports.   Review of Systems  All other systems reviewed and are negative.  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   Semantic vs logopenic   Pure hypercholesterolemia    Renal cyst    Shoulder joint pain, right    Wears glasses     Past Surgical History:  Procedure Laterality Date   TONSILLECTOMY  age 30   TRANSURETHRAL RESECTION OF PROSTATE N/A 02/05/2018   Procedure: TRANSURETHRAL RESECTION OF THE PROSTATE (TURP);  Surgeon: Malen Gauze, MD;  Location: [facility];  Service: Urology;  Laterality: N/A;     Current Outpatient Medications:    diclofenac Sodium (VOLTAREN) 1 % GEL, Apply 2 g topically 4 (four) times daily. Rub into affected area of foot 2 to 4 times daily, Disp: 100 g, Rfl: 2   ketoconazole (NIZORAL) 2 % cream, APPLY TO AFFECTED AREA TWICE A DAY, Disp: , Rfl:    memantine (NAMENDA) 10 MG tablet, Take by mouth., Disp: , Rfl:    terbinafine (LAMISIL) 250 MG tablet, Take 250 mg by mouth  daily., Disp: , Rfl:    acetaminophen (TYLENOL) 325 MG tablet, 1 tablet as needed, Disp: , Rfl:    mirabegron ER (MYRBETRIQ) 25 MG TB24 tablet, Take 1 tablet (25 mg total) by mouth 2 (two) times daily., Disp: 30 tablet, Rfl: 3   QUEtiapine (SEROQUEL) 25 MG tablet, Take 1 tablet (25 mg total) by mouth at bedtime., Disp: 30 tablet, Rfl: 3   rivastigmine (EXELON) 1.5 MG capsule, Take 1 capsule (1.5 mg total) by mouth 2 (two) times daily. Take one capsule 2 times a day, Disp: 60 capsule, Rfl: 1  No Known Allergies         Objective:  Physical Exam  General: NAD  Dermatological: Skin is warm, dry and supple bilateral. There are no open sores, no preulcerative lesions, no rash or signs of infection present.  Vascular: DP pulses 2/4, PT pulse 1/4.  There is no pain with calf compression, swelling, warmth, erythema.   Neruologic: Grossly intact via light touch bilateral.   Musculoskeletal: Moderate bunions present on the right foot.  Tender to palpation within the bunion itself.  Mild discomfort MPJ range of motion.  There is no other areas of pinpoint tenderness.  MMT 4/5.       Assessment:   66 year old male with gait instability  Plan:  -Treatment options discussed including all alternatives, risks, and complications -Etiology of symptoms were discussed -X-rays were obtained and reviewed.  3 views of the right foot were obtained.  Moderate bunions present.  No evidence of acute fracture. -Previously had injections without significant improvement so he held off on any further injections today.  Discussed Voltaren gel as well as different shoes to avoid excess pressure as well as offloading pads for the bunion.  Long-term can consider surgical intervention if needed but given his dementia I prefer to hold off on this if possible. -Continue compression socks to help with swelling although not significant swelling noted today. -ABI ordered to rule out any arterial issues that could  be contributing to symptoms as well. -Referral to physical therapy help gait instability.     Vivi Barrack DPM

## 2022-01-25 ENCOUNTER — Ambulatory Visit (HOSPITAL_COMMUNITY)
Admission: RE | Admit: 2022-01-25 | Discharge: 2022-01-25 | Disposition: A | Discharge status: Home / Self Care | Payer: 59 | Source: Ambulatory Visit | Attending: Podiatry | Admitting: Podiatry

## 2022-01-25 DIAGNOSIS — R0989 Other specified symptoms and signs involving the circulatory and respiratory systems: Secondary | ICD-10-CM | POA: Diagnosis not present

## 2022-01-26 ENCOUNTER — Telehealth: Payer: Self-pay | Admitting: Anesthesiology

## 2022-01-26 DIAGNOSIS — M79674 Pain in right toe(s): Secondary | ICD-10-CM | POA: Diagnosis not present

## 2022-01-26 DIAGNOSIS — B351 Tinea unguium: Secondary | ICD-10-CM | POA: Diagnosis not present

## 2022-01-26 DIAGNOSIS — L600 Ingrowing nail: Secondary | ICD-10-CM | POA: Diagnosis not present

## 2022-01-26 DIAGNOSIS — M79675 Pain in left toe(s): Secondary | ICD-10-CM | POA: Diagnosis not present

## 2022-01-26 NOTE — Telephone Encounter (Signed)
Patient advised of dosing of medication that was already advised by [name], PA-C

## 2022-01-26 NOTE — Telephone Encounter (Signed)
Pt's wife is requesting a call back she has questions regarding pt's medications.

## 2022-01-26 NOTE — Telephone Encounter (Signed)
  Start Seroquel 25 mg nightly, blackbox warning was discussed with family. Continue rivastigmine 1.5 mg twice daily and Namenda 10 mg twice daily, side effects discussed Will call patient back, ordered by Jerelyn Charles

## 2022-01-28 ENCOUNTER — Other Ambulatory Visit: Payer: Self-pay | Admitting: Podiatry

## 2022-01-28 DIAGNOSIS — I739 Peripheral vascular disease, unspecified: Secondary | ICD-10-CM

## 2022-02-02 ENCOUNTER — Telehealth: Payer: Self-pay | Admitting: *Deleted

## 2022-02-02 ENCOUNTER — Ambulatory Visit: Payer: 59 | Attending: Podiatry

## 2022-02-02 DIAGNOSIS — R2681 Unsteadiness on feet: Secondary | ICD-10-CM | POA: Insufficient documentation

## 2022-02-02 DIAGNOSIS — R29810 Facial weakness: Secondary | ICD-10-CM | POA: Diagnosis not present

## 2022-02-02 DIAGNOSIS — R2689 Other abnormalities of gait and mobility: Secondary | ICD-10-CM | POA: Insufficient documentation

## 2022-02-02 DIAGNOSIS — M6281 Muscle weakness (generalized): Secondary | ICD-10-CM | POA: Diagnosis not present

## 2022-02-02 NOTE — Therapy (Signed)
OUTPATIENT PHYSICAL THERAPY LOWER EXTREMITY EVALUATION   Patient Name: [name] MRN: [id number] DOB:Mar 08, 1955, 66 y.o., male Today's Date: 02/02/2022  END OF SESSION:  PT End of Session - 02/02/22 1726     Visit Number 1    Number of Visits 13    Date for PT Re-Evaluation 03/25/22    Authorization Type [employer] OTHER    PT Start Time [phone]    PT Stop Time 0935    PT Time Calculation (min) 45 min    Activity Tolerance Patient tolerated treatment well    Behavior During Therapy [facility] for tasks assessed/performed             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   Semantic vs logopenic   Pure hypercholesterolemia    Renal cyst    Shoulder joint pain, right    Wears glasses    Past Surgical History:  Procedure Laterality Date   TONSILLECTOMY  age 89   TRANSURETHRAL RESECTION OF PROSTATE N/A 02/05/2018   Procedure: TRANSURETHRAL RESECTION OF THE PROSTATE (TURP);  Surgeon: Riley Gauze, MD;  Location: [facility];  Service: Urology;  Laterality: N/A;   Patient Active Problem List   Diagnosis Date Noted   Increased frequency of urination 03/24/2021   Major neurocognitive disorder 03/24/2021   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   Fatty liver    Hardening of the aorta (main artery of the heart)    Insomnia    Pain in right hip    Personality disorder    Pure hypercholesterolemia    Renal cyst    Shoulder joint pain, right    Male erectile dysfunction 12/21/2019   Benign prostatic hyperplasia without lower urinary tract symptoms 02/05/2018    PCP: Riley Coe,  MD   REFERRING PROVIDER: Vivi Singh, DPM  REFERRING DIAG: R26.81 (ICD-10-CM) - Gait instability   THERAPY DIAG:  Other abnormalities of gait and mobility  Facial weakness  Rationale for Evaluation and Treatment: Rehabilitation  ONSET DATE: March 2023  SUBJECTIVE:   SUBJECTIVE STATEMENT: Pt's wife, [name], reports concern re: abilty to walk and pt having R great toe pain. She notes, he will not use a RW or quad cane for assist with walking and has had 2 falls in the past 6 months.  PERTINENT HISTORY: Major neurocognitive disorder, Primary progressive aphasia; HAV PAIN:  Are you having pain? Yes: NPRS scale: 0/10 Pain location: Great toe Pain description: ache Aggravating factors: walking Relieving factors: ? Pt was not experience great toe pain today, but wife notes it can be significant.  PRECAUTIONS: None  WEIGHT BEARING RESTRICTIONS: No  FALLS:  Has patient fallen in last 6 months? Yes. Number of falls 2  LIVING ENVIRONMENT: Lives with: lives with their family Lives in: House/apartment Stairs: Yes: External: 5 steps; on left going up Has following equipment at home: Quad cane small base Will not use  OCCUPATION: Lobbyist, Details car  PLOF: Independent  PATIENT GOALS: To walk better  NEXT MD VISIT:   OBJECTIVE:   DIAGNOSTIC FINDINGS:  MRI 09/20/21 IMPRESSION:  1. No acute or subacute insult, stable from 03/16/2021 brain MRI. 2. Severe bilateral anterior temporal encephalomalacia. 3. Mild chronic white matter disease.   PATIENT SURVEYS:  Not appropriate for pt  COGNITION: Overall cognitive status: History of cognitive impairments - at baseline     SENSATION: WFL  EDEMA:  Not assessed  MUSCLE LENGTH: Hamstrings: Right NA deg; Left NA deg [name] test: Right NA deg; Left NA deg  POSTURE:  Stands with R hip ER and decreased ankle DF   PALPATION: TTP of R great toe  LOWER EXTREMITY ROM:  Active ROM Right eval Left eval   Hip flexion    Hip extension    Hip abduction    Hip adduction    Hip internal rotation    Hip external rotation    Knee flexion 120 130  Knee extension    Ankle dorsiflexion 10 lacking, passive not sig limitation   Ankle plantarflexion    Ankle inversion    Ankle eversion     (Blank rows = not tested)  LOWER EXTREMITY MMT:  MMT Right eval Left eval  Hip flexion 2 4+  Hip extension 2 4+  Hip abduction 2 4+  Hip adduction 2 4+  Hip internal rotation 2 4+  Hip external rotation 2 4+  Knee flexion 2 5  Knee extension 2 5  Ankle dorsiflexion    Ankle plantarflexion    Ankle inversion    Ankle eversion     (Blank rows = not tested)  LOWER EXTREMITY SPECIAL TESTS:  NT  FUNCTIONAL TESTS:  5 times sit to stand: TBA  : TBA  Berg: TBA GAIT: Distance walked: 251ft Assistive device utilized: None Level of assistance: SBA for safety Comments: Walks c R hip in ER, decreased knee ROM, decreased ankle. Rolls over  great toe/medial foot during stance phase of gait.   TODAY'S TREATMENT:   No treatment provided on eval                                                                                                                             PATIENT EDUCATION:  Education details: Eval findings, POC Person educated: Patient and Spouse Education method: Explanation Education comprehension: verbalized understanding  HOME EXERCISE PROGRAM: To be provided  ASSESSMENT:  CLINICAL IMPRESSION: Patient is a 66 y.o. male who was seen today for physical therapy evaluation and treatment for Gait instability.  Pt presents with R leg weakness; decreased knee flex and ext and ankle DF resulting in an abnormal gait pattern noted above. Deficits appear to be neurological related. Pt has had 2 falls in the past 6 months. Pt is also experiencing R great toe (HAV) which is aggravated by his walking pattern of out toeing and decreased DF which increases strain on the R great toe. Pt  followed directions well needed further explanation about 10% of the time. Pt will benefit from skilled PT to address impairments for improved function mobility with less  pain.  OBJECTIVE IMPAIRMENTS: Abnormal gait, difficulty walking, decreased ROM, decreased strength, improper body mechanics, and pain.   ACTIVITY LIMITATIONS: carrying, lifting, standing, squatting, and locomotion level  PARTICIPATION LIMITATIONS: meal prep, cleaning, laundry, driving, and occupation  PERSONAL FACTORS: Fitness, Past/current experiences, Time since onset of injury/illness/exacerbation, and 1-2 comorbidities: Major neurocognitive disorder, Primary progressive aphasia; HAV  are also affecting patient's functional outcome.   REHAB POTENTIAL: Fair due to comorbidities  CLINICAL DECISION MAKING: Evolving/moderate complexity  EVALUATION COMPLEXITY: Moderate   GOALS:  SHORT TERM GOALS: Target date: 02/25/21 Pt will be Ind in an initial HEP Baseline: To be initiated Goal status: INITIAL  2.  Assess pt's gait pattern with DF assist Baseline: to be completed Goal status: INITIAL  LONG TERM GOALS: Target date: 03/25/21  Pt will be Ind in a final HEP to maintain achieved LOF  Baseline: To be initiated Goal status: INITIAL  2.  Increase R hip, knee, and ankle strength to 3/5 Baseline: 2/5 Goal status: INITIAL  3.  Pt will walk with decreased out toeing and increased DF for an improved gait pattern and to decrease the intensity and frequency of R great toe pain Baseline: abnormal gait pattern Goal status: INITIAL  4.  Improve Berg balance by MCID of 7 for improved safety Baseline: TBA Goal status: INITIAL  5.  Improve 5xSTS by MCID of 5" and by MCID of 23ft as indication of improved functional mobility  Baseline: TBA Goal status: INITIAL  PLAN:  PT FREQUENCY: 2x/week  PT DURATION: 6 weeks  PLANNED INTERVENTIONS: Therapeutic exercises, Therapeutic activity, Neuromuscular re-education,  Balance training, Gait training, Patient/Family education, Self Care, Joint mobilization, Cryotherapy, Moist heat, Taping, Manual therapy, and Re-evaluation, TPDN  PLAN FOR NEXT SESSION: Develop HEP; progress therex as indicated; use of modalities, manual therapy, TPDN as indicated.   Skya Mccullum MS, PT 02/02/22 5:48 PM

## 2022-02-02 NOTE — Telephone Encounter (Signed)
Patient's wife is calling because husband's toes are still swelling, is starting therapy today, will this be ok? Please advise

## 2022-02-08 ENCOUNTER — Ambulatory Visit: Payer: 59 | Admitting: Physical Therapy

## 2022-02-08 ENCOUNTER — Ambulatory Visit (INDEPENDENT_AMBULATORY_CARE_PROVIDER_SITE_OTHER): Payer: 59 | Admitting: Vascular Surgery

## 2022-02-08 ENCOUNTER — Encounter: Payer: Self-pay | Admitting: Vascular Surgery

## 2022-02-08 ENCOUNTER — Encounter: Payer: Self-pay | Admitting: Physical Therapy

## 2022-02-08 VITALS — BP 133/83 | HR 78 | Temp 98.2°F | Resp 16 | Ht 66.0 in | Wt 153.0 lb

## 2022-02-08 DIAGNOSIS — R2681 Unsteadiness on feet: Secondary | ICD-10-CM | POA: Diagnosis not present

## 2022-02-08 DIAGNOSIS — I739 Peripheral vascular disease, unspecified: Secondary | ICD-10-CM | POA: Insufficient documentation

## 2022-02-08 DIAGNOSIS — R2689 Other abnormalities of gait and mobility: Secondary | ICD-10-CM | POA: Diagnosis not present

## 2022-02-08 DIAGNOSIS — M6281 Muscle weakness (generalized): Secondary | ICD-10-CM | POA: Diagnosis not present

## 2022-02-08 DIAGNOSIS — R29810 Facial weakness: Secondary | ICD-10-CM | POA: Diagnosis not present

## 2022-02-08 NOTE — Progress Notes (Signed)
Patient name: [name] MRN: [id number] DOB: [birth year] Sex: male  REASON FOR CONSULT: Evaluate PAD  HPI: [name] is a 66 y.o. male, with major neurocognitive disorder with behavioral disturbance as well as hyperlipidemia that presents as a referral for evaluation of PAD.  Patient saw Dr. Loreta Ave with podiatry for right foot bunions.  He describes pain on the right big toe ongoing for months.  He had ABIs on 02/04/2022 that were 0.93 on the right triphasic and 1.1 on the left triphasic with evidence of mild disease on the right.  A lot of the history is obtained from the family due to his cognitive condition.  He also complains of swelling in the lower extremities and toes.  No consistent leg pain with ambulation.  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   Semantic vs logopenic   Pure hypercholesterolemia    Renal cyst    Shoulder joint pain, right    Wears glasses     Past Surgical History:  Procedure Laterality Date   TONSILLECTOMY  age 87   TRANSURETHRAL RESECTION OF PROSTATE N/A 02/05/2018   Procedure: TRANSURETHRAL RESECTION OF THE PROSTATE (TURP);  Surgeon: Malen Gauze, MD;  Location: [facility];  Service: Urology;  Laterality: N/A;    Family History  Problem Relation Age of Onset   Dementia Father     SOCIAL HISTORY: Social History   Socioeconomic History   Marital status: Married    Spouse name: Not on file   Number of children: 7   Years of education: 10   Highest education level: 10th grade  Occupational History   Occupation: [employer]  Tobacco Use    Smoking status: Never   Smokeless tobacco: Never  Vaping Use   Vaping Use: Never used  Substance and Sexual Activity   Alcohol use: Not Currently    Comment: occasional   Drug use: Never   Sexual activity: Not on file  Other Topics Concern   Not on file  Social History Narrative   Lives with wife   Right handed   One story home   Social Determinants of Health   Financial Resource Strain: Not on file  Food Insecurity: Not on file  Transportation Needs: Not on file  Physical Activity: Not on file  Stress: Not on file  Social Connections: Not on file  Intimate Partner Violence: Not on file    No Known Allergies  Current Outpatient Medications  Medication Sig Dispense Refill   acetaminophen (TYLENOL) 325 MG tablet 1 tablet as needed     diclofenac Sodium (VOLTAREN) 1 % GEL Apply 2 g topically 4 (four) times daily. Rub into affected area of foot 2 to 4 times daily 100 g 2   ketoconazole (NIZORAL) 2 % cream APPLY TO AFFECTED AREA TWICE A DAY     memantine (NAMENDA) 10 MG tablet Take by mouth.     mirabegron ER (MYRBETRIQ) 25 MG TB24 tablet Take 1 tablet (25 mg total) by mouth 2 (two) times daily. 30 tablet 3   QUEtiapine (SEROQUEL) 25 MG  tablet Take 1 tablet (25 mg total) by mouth at bedtime. 30 tablet 3   rivastigmine (EXELON) 1.5 MG capsule Take 1 capsule (1.5 mg total) by mouth 2 (two) times daily. Take one capsule 2 times a day 60 capsule 1   terbinafine (LAMISIL) 250 MG tablet Take 250 mg by mouth daily.     No current facility-administered medications for this visit.    REVIEW OF SYSTEMS:  [X]  denotes positive finding, [ ]  denotes negative finding Cardiac  Comments:  Chest pain or chest pressure:    Shortness of breath upon exertion:    Short of breath when lying flat:    Irregular heart rhythm:        Vascular    Pain in calf, thigh, or hip brought on by ambulation:    Pain in feet at night that wakes you up from your sleep:     Blood clot in your veins:    Leg  swelling:  x       Pulmonary    Oxygen at home:    Productive cough:     Wheezing:         Neurologic    Sudden weakness in arms or legs:     Sudden numbness in arms or legs:     Sudden onset of difficulty speaking or slurred speech:    Temporary loss of vision in one eye:     Problems with dizziness:         Gastrointestinal    Blood in stool:     Vomited blood:         Genitourinary    Burning when urinating:     Blood in urine:        Psychiatric    Major depression:         Hematologic    Bleeding problems:    Problems with blood clotting too easily:        Skin    Rashes or ulcers:        Constitutional    Fever or chills:      PHYSICAL EXAM: Vitals:   02/08/22 1106  BP: 133/83  Pulse: 78  Resp: 16  Temp: 98.2 F (36.8 C)  TempSrc: Temporal  SpO2: 98%  Weight: 153 lb (69.4 kg)  Height: 5\' 6"  (1.676 m)    GENERAL: The patient is a well-nourished male, in no acute distress. The vital signs are documented above. CARDIAC: There is a regular rate and rhythm.  VASCULAR:  Palpable femoral pulses bilaterally Right DP weakly palpable correlated with a brisk triphasic Doppler signal Left DP palpable No lower extremity tissue loss PULMONARY: No respiratory distress. ABDOMEN: Soft and non-tender. MUSCULOSKELETAL: There are no major deformities or cyanosis. NEUROLOGIC: No focal weakness or paresthesias are detected. SKIN: There are no ulcers or rashes noted. PSYCHIATRIC: The patient has a normal affect.  DATA:   ABI 02/04/22 were 0.93 on the right triphasic and 1.01 on the left triphasic - mild right lower extremity arterial disease  Assessment/Plan:  66 year old male with major neurocognitive disorder with behavioral disturbance as well as hyperlipidemia that presents for evaluation of PAD particularly in the right lower extremity where he has had great toe pain for months.  Discussed that his ABI on the right are 0.93 triphasic with evidence of only  mild disease.  He has a weakly palpable dorsalis pedis pulse and I correlated this with a brisk doppler signal.  I do not think he has any signs  of critical limb ischemia that would explain his toe pain and I do not think this is rest pain.  No tissue loss on exam either.  Does not endorse any claudication  I think this is likely related to his bunion that is being managed by podiatry.  I will have him follow-up in 1 year with ABIs in the [facility] clinic for ongoing surveillance.   Cephus Shelling, MD [facility] Office: [phone]

## 2022-02-08 NOTE — Therapy (Signed)
OUTPATIENT PHYSICAL THERAPY TREATMENT NOTE   Patient Name: [name] MRN: [id number] DOB:[birth year], 66 y.o., male Today's Date: 02/08/2022  PCP: Irven Coe, MD     REFERRING PROVIDER: Vivi Barrack, DPM  END OF SESSION:   PT End of Session - 02/08/22 0738     Visit Number 2    Number of Visits 13    Date for PT Re-Evaluation 03/25/22    Authorization Type [employer] OTHER    PT Start Time 0715    PT Stop Time 0800    PT Time Calculation (min) 45 min             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   Semantic vs logopenic   Pure hypercholesterolemia    Renal cyst    Shoulder joint pain, right    Wears glasses    Past Surgical History:  Procedure Laterality Date   TONSILLECTOMY  age 70   TRANSURETHRAL RESECTION OF PROSTATE N/A 02/05/2018   Procedure: TRANSURETHRAL RESECTION OF THE PROSTATE (TURP);  Surgeon: Malen Gauze, MD;  Location: [facility];  Service: Urology;  Laterality: N/A;   Patient Active Problem List   Diagnosis Date Noted   Increased frequency of urination 03/24/2021   Major neurocognitive disorder 03/24/2021   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   Fatty liver    Hardening of the aorta (main artery of the heart)    Insomnia    Pain in right hip    Personality disorder    Pure hypercholesterolemia    Renal cyst    Shoulder joint pain, right    Male erectile dysfunction 12/21/2019   Benign prostatic hyperplasia without lower urinary tract symptoms 02/05/2018    REFERRING DIAG: R26.81 (ICD-10-CM) - Gait instability   THERAPY DIAG:   Other abnormalities of gait and mobility  Rationale for Evaluation and Treatment Rehabilitation  PERTINENT HISTORY: Major neurocognitive disorder, Primary progressive aphasia; HAV  PRECAUTIONS: None  SUBJECTIVE:                                                                                                                                                                                      SUBJECTIVE STATEMENT:  No pain right now.    PAIN:  Are you having pain? Yes: NPRS scale: 0/10 Pain location: Great toe Pain description: ache Aggravating factors: walking Relieving factors: ? Pt was not experience great toe pain today, but wife notes it can be significant.   OBJECTIVE: (objective measures completed at initial evaluation unless otherwise dated)   DIAGNOSTIC FINDINGS:  MRI 09/20/21 IMPRESSION: 1. No acute or subacute insult, stable from 03/16/2021 brain MRI. 2. Severe bilateral anterior temporal encephalomalacia. 3. Mild chronic white matter disease.   PATIENT SURVEYS:  Not appropriate for pt   COGNITION: Overall cognitive status: History of cognitive impairments - at baseline                              SENSATION: WFL   EDEMA:  Not assessed   MUSCLE LENGTH: Hamstrings: Right NA deg; Left NA deg Maisie Fushomas test: Right NA deg; Left NA deg   POSTURE:  Stands with R hip ER and decreased ankle DF    PALPATION: TTP of R great toe   LOWER EXTREMITY ROM:   Active ROM Right eval Left eval  Hip flexion      Hip extension      Hip abduction      Hip adduction      Hip internal rotation      Hip external rotation      Knee flexion 120 130  Knee extension      Ankle dorsiflexion 10 lacking, passive not sig limitation    Ankle plantarflexion      Ankle inversion      Ankle eversion       (Blank rows = not tested)   LOWER EXTREMITY MMT:   MMT Right eval Left eval  Hip flexion 2 4+  Hip extension 2 4+  Hip abduction 2 4+  Hip adduction 2 4+  Hip  internal rotation 2 4+  Hip external rotation 2 4+  Knee flexion 2 5  Knee extension 2 5  Ankle dorsiflexion      Ankle plantarflexion      Ankle inversion      Ankle eversion       (Blank rows = not tested)   LOWER EXTREMITY SPECIAL TESTS:  NT   FUNCTIONAL TESTS:  02/08/22: 5 times sit to stand: 49 sec (mat table with UE use)             02/08/22: 2MWT: 249 feet            Berg: TBA GAIT: Distance walked: 28200ft Assistive device utilized: None Level of assistance: SBA for safety Comments: Walks c R hip in ER, decreased knee ROM, decreased ankle. Rolls over  great toe/medial foot during stance phase of gait.     TODAY'S TREATMENT:  [facility] Adult PT Treatment:                                                DATE: 02/08/22 Therapeutic Exercise: Nustep L4 UE/LE x 5 minutes  Seated wooden rocker board A/P - tactile and verbal cues for correct technique Seated heel and toe  raises- tactile cues for RLE Seated EOM Hamstring stretch 10 sec x 3 each with knee ext over pressure +HEP Gastroc stretch with strap 30 sec x 2 bilat  +HEP Standing Gastroc stretch at wall 2 x 30 sec each with tactile and  verbal cues  Therapeutic Activity: 2 MWT: 249 feet 5 x STS 49 seconds     Eval  No treatment provided on eval                                                                                                                               PATIENT EDUCATION:  Education details: Eval findings, POC Person educated: Patient and Spouse Education method: Explanation Education comprehension: verbalized understanding   HOME EXERCISE PROGRAM: To be provided 02/08/22: provided HEP 2 GO Handout: seated DF stretch with Strap and Seated EOM hamstring stretch 3 x 30 sec each LE , 1 x per day   ASSESSMENT:   CLINICAL IMPRESSION: Patient is a 66 y.o. male who was seen today for physical therapy treatment for Gait instability. Captured 2 MWT and 5 x STS baselines. Began LE mobility and stretching.  Initiated HEP with seated DF and Hamstring stretches. Handout provided to patient and family member.   Pt followed directions well however needed further explanation about 50% of the time via tactile and verbal cues. Pt will benefit from skilled PT to address impairments for improved function mobility with less pain.   OBJECTIVE IMPAIRMENTS: Abnormal gait, difficulty walking, decreased ROM, decreased strength, improper body mechanics, and pain.    ACTIVITY LIMITATIONS: carrying, lifting, standing, squatting, and locomotion level   PARTICIPATION LIMITATIONS: meal prep, cleaning, laundry, driving, and occupation   PERSONAL FACTORS: Fitness, Past/current experiences, Time since onset of injury/illness/exacerbation, and 1-2 comorbidities: Major neurocognitive disorder, Primary progressive aphasia; HAV  are also affecting patient's functional outcome.    REHAB POTENTIAL: Fair due to comorbidities   CLINICAL DECISION MAKING: Evolving/moderate complexity   EVALUATION COMPLEXITY: Moderate     GOALS:   SHORT TERM GOALS: Target date: 02/25/21 Pt will be Ind in an initial HEP Baseline: To be initiated Goal status: INITIAL   2.  Assess pt's gait pattern with DF assist Baseline: to be completed Goal status: INITIAL   LONG TERM GOALS: Target date: 03/25/21   Pt will be Ind in a final HEP to maintain achieved LOF  Baseline: To be initiated Goal status: INITIAL   2.  Increase R hip, knee, and ankle strength to 3/5 Baseline: 2/5 Goal status: INITIAL   3.  Pt will walk with decreased out toeing and increased DF for an improved gait pattern and to decrease the intensity and frequency of R great toe pain Baseline: abnormal gait pattern Goal status: INITIAL   4.  Improve Berg balance by MCID of 7 for improved safety Baseline: TBA Goal status: INITIAL   5.  Improve 5xSTS by MCID of 5" and by MCID of 8ft as indication of improved functional mobility  Baseline: TBA Goal status: INITIAL    PLAN:   PT FREQUENCY: 2x/week   PT DURATION: 6 weeks   PLANNED INTERVENTIONS: Therapeutic exercises, Therapeutic activity, Neuromuscular re-education, Balance training, Gait training, Patient/Family education, Self Care, Joint mobilization, Cryotherapy,  Moist heat, Taping, Manual therapy, and Re-evaluation, TPDN   PLAN FOR NEXT SESSION: progress HEP PRN,  progress therex as indicated; use of modalities, manual therapy, TPDN as indicated. BERG   Jannette Spanner, PTA 02/08/22 8:09 AM Phone: [phone] Fax: [phone]

## 2022-02-09 NOTE — Therapy (Signed)
OUTPATIENT PHYSICAL THERAPY TREATMENT NOTE   Patient Name: [name] MRN: [id number] DOB:13-Jul-1955, 66 y.o., male Today's Date: 02/10/2022  PCP: Irven Coe, MD     REFERRING PROVIDER: Vivi Barrack, DPM  END OF SESSION:   PT End of Session - 02/10/22 0723     Visit Number 3    Number of Visits 13    Date for PT Re-Evaluation 03/25/22    Authorization Type [employer] OTHER    PT Start Time [phone]    PT Stop Time 0800    PT Time Calculation (min) 43 min    Activity Tolerance Patient tolerated treatment well    Behavior During Therapy [facility] for tasks assessed/performed             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   Semantic vs logopenic   Pure hypercholesterolemia    Renal cyst    Shoulder joint pain, right    Wears glasses    Past Surgical History:  Procedure Laterality Date   TONSILLECTOMY  age 80   TRANSURETHRAL RESECTION OF PROSTATE N/A 02/05/2018   Procedure: TRANSURETHRAL RESECTION OF THE PROSTATE (TURP);  Surgeon: Malen Gauze, MD;  Location: [facility];  Service: Urology;  Laterality: N/A;   Patient Active Problem List   Diagnosis Date Noted   PAD (peripheral artery disease) (HCC) 02/08/2022   Increased frequency of urination 03/24/2021   Major neurocognitive disorder 03/24/2021   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   Fatty liver    Hardening of the aorta (main artery of the heart)    Insomnia    Pain in right hip    Personality disorder    Pure hypercholesterolemia    Renal cyst    Shoulder joint pain, right    Male erectile dysfunction  12/21/2019   Benign prostatic hyperplasia without lower urinary tract symptoms 02/05/2018    REFERRING DIAG: R26.81 (ICD-10-CM) - Gait instability   THERAPY DIAG:  Other abnormalities of gait and mobility  Muscle weakness (generalized)  Rationale for Evaluation and Treatment Rehabilitation  PERTINENT HISTORY: Major neurocognitive disorder, Primary progressive aphasia; HAV  PRECAUTIONS: None  SUBJECTIVE:  SUBJECTIVE STATEMENT:  Pt reports a little bit of R knee pain.   PAIN:  Are you having pain? Yes: NPRS scale: 0/10 Pain location: Great toe Pain description: ache Aggravating factors: walking Relieving factors: ? Pt was not experience great toe pain today, but wife notes it can be significant.   OBJECTIVE: (objective measures completed at initial evaluation unless otherwise dated)   DIAGNOSTIC FINDINGS:  MRI 09/20/21 IMPRESSION: 1. No acute or subacute insult, stable from 03/16/2021 brain MRI. 2. Severe bilateral anterior temporal encephalomalacia. 3. Mild chronic white matter disease.   PATIENT SURVEYS:  Not appropriate for pt   COGNITION: Overall cognitive status: History of cognitive impairments - at baseline                              SENSATION: WFL   EDEMA:  Not assessed   MUSCLE LENGTH: Hamstrings: Right NA deg; Left NA deg Maisie Fushomas test: Right NA deg; Left NA deg   POSTURE:  Stands with R hip ER and decreased ankle DF    PALPATION: TTP of R great toe   LOWER EXTREMITY ROM:   Active ROM Right eval Left eval  Hip flexion      Hip extension      Hip abduction      Hip adduction      Hip internal rotation      Hip external rotation      Knee flexion 120 130  Knee extension      Ankle dorsiflexion 10 lacking, passive not sig limitation    Ankle plantarflexion       Ankle inversion      Ankle eversion       (Blank rows = not tested)   LOWER EXTREMITY MMT:   MMT Right eval Left eval  Hip flexion 2 4+  Hip extension 2 4+  Hip abduction 2 4+  Hip adduction 2 4+  Hip internal rotation 2 4+  Hip external rotation 2 4+  Knee flexion 2 5  Knee extension 2 5  Ankle dorsiflexion      Ankle plantarflexion      Ankle inversion      Ankle eversion       (Blank rows = not tested)   LOWER EXTREMITY SPECIAL TESTS:  NT   FUNCTIONAL TESTS:  02/08/22: 5 times sit to stand: 49 sec (mat table with UE use)             02/08/22: 2MWT: 249 feet            Berg: TBA GAIT: Distance walked: 23300ft Assistive device utilized: None Level of assistance: SBA for safety Comments: Walks c R hip in ER, decreased knee ROM, decreased ankle. Rolls over  great toe/medial foot during stance phase of gait.     TODAY'S TREATMENT: [facility] Adult PT Treatment:                                                DATE: 02/10/22 Therapeutic Exercise: Seated DF 2x10  STS completing toes forward, equal weight bearing, with and s use of hands 2x10 Standing Gastroc stretch at wall 2 x 30 sec each with tactile and verbal cues Therapeutic Activity: Gait training with DF assist. Verbal cueing for forwar step with toes forward to minimize outing Self Care: Discussed  the purpose of a DF brace/strap and provided information to pt's wife to obtain    [facility] Adult PT Treatment:                                                DATE: 02/08/22 Therapeutic Exercise: Nustep L4 UE/LE x 5 minutes  Seated wooden rocker board A/P - tactile and verbal cues for correct technique Seated heel and toe  raises- tactile cues for RLE Seated EOM Hamstring stretch 10 sec x 3 each with knee ext over pressure +HEP Gastroc stretch with strap 30 sec x 2 bilat  +HEP Standing Gastroc stretch at wall 2 x 30 sec each with tactile and verbal cues  Therapeutic Activity: 2 MWT: 249 feet 5 x STS 49 seconds      Eval  No treatment provided on eval                                                                                                                               PATIENT EDUCATION:  Education details: Eval findings, POC Person educated: Patient and Spouse Education method: Explanation Education comprehension: verbalized understanding   HOME EXERCISE PROGRAM: To be provided 02/08/22: provided HEP 2 GO Handout: seated DF stretch with Strap and Seated EOM hamstring stretch 3 x 30 sec each LE , 1 x per day   ASSESSMENT:   CLINICAL IMPRESSION: PT was completed for gait training with the assessment the use of a DF brace to improve the quality of the pt's gait pattern. With DF assist, pt was able to walk with an improved gait pattern with less foot drop and decreased out toeing. Results and info for a DF brace/strap were discussed with pt's wife. PT was also completed to promote R ankle DF and R LE strength. With STS cueing was needed for forward lean for proper positioning. Pt has decreased descent control the last 1/3rd of sitting without use of hands.    OBJECTIVE IMPAIRMENTS: Abnormal gait, difficulty walking, decreased ROM, decreased strength, improper body mechanics, and pain.    ACTIVITY LIMITATIONS: carrying, lifting, standing, squatting, and locomotion level   PARTICIPATION LIMITATIONS: meal prep, cleaning, laundry, driving, and occupation   PERSONAL FACTORS: Fitness, Past/current experiences, Time since onset of injury/illness/exacerbation, and 1-2 comorbidities: Major neurocognitive disorder, Primary progressive aphasia; HAV  are also affecting patient's functional outcome.    REHAB POTENTIAL: Fair due to comorbidities   CLINICAL DECISION MAKING: Evolving/moderate complexity   EVALUATION COMPLEXITY: Moderate     GOALS:   SHORT TERM GOALS: Target date: 02/25/21 Pt will be Ind in an initial HEP Baseline: To be initiated Goal status: INITIAL   2.  Assess pt's gait  pattern with DF assist Baseline: to be completed Goal status: INITIAL   LONG TERM GOALS: Target date: 03/25/21   Pt will  be Ind in a final HEP to maintain achieved LOF  Baseline: To be initiated Goal status: INITIAL   2.  Increase R hip, knee, and ankle strength to 3/5 Baseline: 2/5 Goal status: INITIAL   3.  Pt will walk with decreased out toeing and increased DF for an improved gait pattern and to decrease the intensity and frequency of R great toe pain Baseline: abnormal gait pattern Goal status: INITIAL   4.  Improve Berg balance by MCID of 7 for improved safety Baseline: TBA Goal status: INITIAL   5.  Improve 5xSTS by MCID of 5" and by MCID of 4ft as indication of improved functional mobility  Baseline: TBA Goal status: INITIAL   PLAN:   PT FREQUENCY: 2x/week   PT DURATION: 6 weeks   PLANNED INTERVENTIONS: Therapeutic exercises, Therapeutic activity, Neuromuscular re-education, Balance training, Gait training, Patient/Family education, Self Care, Joint mobilization, Cryotherapy, Moist heat, Taping, Manual therapy, and Re-evaluation, TPDN   PLAN FOR NEXT SESSION: progress HEP PRN,  progress therex as indicated; use of modalities, manual therapy, TPDN as indicated. BERG   Jawanna Dykman MS, PT 02/10/22 9:15 AM

## 2022-02-10 ENCOUNTER — Ambulatory Visit: Payer: 59

## 2022-02-10 DIAGNOSIS — R2681 Unsteadiness on feet: Secondary | ICD-10-CM | POA: Diagnosis not present

## 2022-02-10 DIAGNOSIS — M6281 Muscle weakness (generalized): Secondary | ICD-10-CM

## 2022-02-10 DIAGNOSIS — R2689 Other abnormalities of gait and mobility: Secondary | ICD-10-CM | POA: Diagnosis not present

## 2022-02-10 DIAGNOSIS — R29810 Facial weakness: Secondary | ICD-10-CM | POA: Diagnosis not present

## 2022-02-16 ENCOUNTER — Ambulatory Visit: Payer: 59 | Admitting: Physical Therapy

## 2022-02-16 ENCOUNTER — Encounter: Payer: Self-pay | Admitting: Physical Therapy

## 2022-02-16 DIAGNOSIS — R29810 Facial weakness: Secondary | ICD-10-CM | POA: Diagnosis not present

## 2022-02-16 DIAGNOSIS — M6281 Muscle weakness (generalized): Secondary | ICD-10-CM

## 2022-02-16 DIAGNOSIS — R2689 Other abnormalities of gait and mobility: Secondary | ICD-10-CM | POA: Diagnosis not present

## 2022-02-16 DIAGNOSIS — R2681 Unsteadiness on feet: Secondary | ICD-10-CM | POA: Diagnosis not present

## 2022-02-16 NOTE — Therapy (Signed)
OUTPATIENT PHYSICAL THERAPY TREATMENT NOTE   Patient Name: [name] MRN: [id number] DOB:Mar 09, 1955, 66 y.o., male Today's Date: 02/16/2022  PCP: Irven Coe, MD     REFERRING PROVIDER: Vivi Barrack, DPM  END OF SESSION:   PT End of Session - 02/16/22 0807     Visit Number 4    Number of Visits 13    Date for PT Re-Evaluation 03/25/22    Authorization Type [employer] OTHER    PT Start Time 0803    PT Stop Time 0845    PT Time Calculation (min) 42 min             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   Semantic vs logopenic   Pure hypercholesterolemia    Renal cyst    Shoulder joint pain, right    Wears glasses    Past Surgical History:  Procedure Laterality Date   TONSILLECTOMY  age 76   TRANSURETHRAL RESECTION OF PROSTATE N/A 02/05/2018   Procedure: TRANSURETHRAL RESECTION OF THE PROSTATE (TURP);  Surgeon: Malen Gauze, MD;  Location: [facility];  Service: Urology;  Laterality: N/A;   Patient Active Problem List   Diagnosis Date Noted   PAD (peripheral artery disease) (HCC) 02/08/2022   Increased frequency of urination 03/24/2021   Major neurocognitive disorder 03/24/2021   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   Fatty liver    Hardening of the aorta (main artery of the heart)    Insomnia    Pain in right hip    Personality disorder    Pure hypercholesterolemia    Renal cyst    Shoulder joint pain, right    Male erectile dysfunction 12/21/2019   Benign prostatic hyperplasia without lower urinary tract symptoms 02/05/2018    REFERRING DIAG:  R26.81 (ICD-10-CM) - Gait instability   THERAPY DIAG:  Other abnormalities of gait and mobility  Muscle weakness (generalized)  Rationale for Evaluation and Treatment Rehabilitation  PERTINENT HISTORY: Major neurocognitive disorder, Primary progressive aphasia; HAV  PRECAUTIONS: None  SUBJECTIVE:  SUBJECTIVE STATEMENT:  No pain today. Feeling okay. Per family: they have not had a chance to order the ankle brace due to the holidays.    PAIN:  Are you having pain? Yes: NPRS scale: 0/10 Pain location: Great toe Pain description: ache Aggravating factors: walking Relieving factors: ? Pt was not experience great toe pain today, but wife notes it can be significant.   OBJECTIVE: (objective measures completed at initial evaluation unless otherwise dated)   DIAGNOSTIC FINDINGS:  MRI 09/20/21 IMPRESSION: 1. No acute or subacute insult, stable from 03/16/2021 brain MRI. 2. Severe bilateral anterior temporal encephalomalacia. 3. Mild chronic white matter disease.   PATIENT SURVEYS:  Not appropriate for pt   COGNITION: Overall cognitive status: History of cognitive impairments - at baseline                              SENSATION: WFL   EDEMA:  Not assessed   MUSCLE LENGTH: Hamstrings: Right NA deg; Left NA deg Maisie Fus test: Right NA deg; Left NA deg   POSTURE:  Stands with R hip ER and decreased ankle DF    PALPATION: TTP of R great toe   LOWER EXTREMITY ROM:   Active ROM Right eval Left eval  Hip flexion      Hip extension      Hip abduction      Hip adduction      Hip internal rotation      Hip external rotation      Knee flexion 120 130  Knee extension      Ankle dorsiflexion 10 lacking, passive not sig limitation    Ankle plantarflexion      Ankle inversion      Ankle  eversion       (Blank rows = not tested)   LOWER EXTREMITY MMT:   MMT Right eval Left eval  Hip flexion 2 4+  Hip extension 2 4+  Hip abduction 2 4+  Hip adduction 2 4+  Hip internal rotation 2 4+  Hip external rotation 2 4+  Knee flexion 2 5  Knee extension 2 5  Ankle dorsiflexion      Ankle plantarflexion      Ankle inversion      Ankle eversion       (Blank rows = not tested)   LOWER EXTREMITY SPECIAL TESTS:  NT   FUNCTIONAL TESTS:  02/08/22: 5 times sit to stand: 49 sec (mat table with UE use)             02/08/22: : 249 feet            Berg: TBA GAIT: Distance walked: 22ft Assistive device utilized: None Level of assistance: SBA for safety Comments: Walks c R hip in ER, decreased knee ROM, decreased ankle. Rolls over  great toe/medial foot during stance phase of gait.     TODAY'S TREATMENT: [facility] Adult PT Treatment:                                                DATE: 02/16/22 Therapeutic Exercise: Nustep L5 intermittent use of UE x 5 minutes  Slant board bilat stretch Step up forward RLE to 4 inch step Step taps to 4 inch step Rocker A/P  AIREX- head turns and nods, EC Airex- narrow stance-  Head turns with LOB , EC  Tandem stance trials- 5 sec x 5  STS x 11 reps - cues to reach out front to promote weight shift.  Seated March- assist with UE to reach full R hip flexion , then eccentric lowering  Seated ball squeeze  Seated DF- 2# on forefoot , 10 x 2   Therapeutic Activity: Cues throughout session for step length and neutral LE   [facility] Adult PT Treatment:                                                DATE: 02/10/22 Therapeutic Exercise: Seated DF 2x10  STS completing toes forward, equal weight bearing, with and s use of hands 2x10 Standing Gastroc stretch at wall 2 x 30 sec each with tactile and verbal cues Therapeutic Activity: Gait training with DF assist. Verbal cueing for forwar step with toes forward to minimize outing Self  Care: Discussed the purpose of a DF brace/strap and provided information to pt's wife to obtain    [facility]                                                DATE: 02/08/22 Therapeutic Exercise: Nustep L4 UE/LE x 5 minutes  Seated wooden rocker board A/P - tactile and verbal cues for correct technique Seated heel and toe  raises- tactile cues for RLE Seated EOM Hamstring stretch 10 sec x 3 each with knee ext over pressure +HEP Gastroc stretch with strap 30 sec x 2 bilat  +HEP Standing Gastroc stretch at wall 2 x 30 sec each with tactile and verbal cues  Therapeutic Activity: 2 MWT: 249 feet 5 x STS 49 seconds     Eval  No treatment provided on eval                                                                                                                               PATIENT EDUCATION:  Education details: Eval findings, POC Person educated: Patient and Spouse Education method: Explanation Education comprehension: verbalized understanding   HOME EXERCISE PROGRAM: To be provided 02/08/22: provided HEP 2 GO Handout: seated DF stretch with Strap and Seated EOM hamstring stretch 3 x 30 sec each LE , 1 x per day   ASSESSMENT:   CLINICAL IMPRESSION:  PT was completed to promote R ankle DF balance and LE strength.  With STS cueing was needed for forward lean for proper positioning. Cues to reach out with sit to stand assisted patient in rising without LOB. Cues for gait deviations were given during ambulation in clinic. He was able to improve his hip flexion and step length with verbal  cues.    OBJECTIVE IMPAIRMENTS: Abnormal gait, difficulty walking, decreased ROM, decreased strength, improper body mechanics, and pain.    ACTIVITY LIMITATIONS: carrying, lifting, standing, squatting, and locomotion level   PARTICIPATION LIMITATIONS: meal prep, cleaning, laundry, driving, and occupation   PERSONAL FACTORS: Fitness, Past/current experiences, Time since onset of  injury/illness/exacerbation, and 1-2 comorbidities: Major neurocognitive disorder, Primary progressive aphasia; HAV  are also affecting patient's functional outcome.    REHAB POTENTIAL: Fair due to comorbidities   CLINICAL DECISION MAKING: Evolving/moderate complexity   EVALUATION COMPLEXITY: Moderate     GOALS:   SHORT TERM GOALS: Target date: 02/25/21 Pt will be Ind in an initial HEP Baseline: To be initiated Goal status: INITIAL   2.  Assess pt's gait pattern with DF assist Baseline: to be completed Goal status: INITIAL   LONG TERM GOALS: Target date: 03/25/21   Pt will be Ind in a final HEP to maintain achieved LOF  Baseline: To be initiated Goal status: INITIAL   2.  Increase R hip, knee, and ankle strength to 3/5 Baseline: 2/5 Goal status: INITIAL   3.  Pt will walk with decreased out toeing and increased DF for an improved gait pattern and to decrease the intensity and frequency of R great toe pain Baseline: abnormal gait pattern Goal status: INITIAL   4.  Improve Berg balance by MCID of 7 for improved safety Baseline: TBA Goal status: INITIAL   5.  Improve 5xSTS by MCID of 5" and by MCID of 40ft as indication of improved functional mobility  Baseline: TBA Goal status: INITIAL   PLAN:   PT FREQUENCY: 2x/week   PT DURATION: 6 weeks   PLANNED INTERVENTIONS: Therapeutic exercises, Therapeutic activity, Neuromuscular re-education, Balance training, Gait training, Patient/Family education, Self Care, Joint mobilization, Cryotherapy, Moist heat, Taping, Manual therapy, and Re-evaluation, TPDN   PLAN FOR NEXT SESSION: progress HEP PRN,  progress therex as indicated; use of modalities, manual therapy, TPDN as indicated. BERG NEXT    Jannette Spanner, PTA 02/16/22 11:21 AM Phone: [phone] Fax: [phone]

## 2022-02-18 ENCOUNTER — Encounter: Payer: Self-pay | Admitting: Physical Therapy

## 2022-02-18 ENCOUNTER — Ambulatory Visit: Payer: 59 | Admitting: Physical Therapy

## 2022-02-18 DIAGNOSIS — N3941 Urge incontinence: Secondary | ICD-10-CM | POA: Diagnosis not present

## 2022-02-18 DIAGNOSIS — R2689 Other abnormalities of gait and mobility: Secondary | ICD-10-CM | POA: Diagnosis not present

## 2022-02-18 DIAGNOSIS — R3121 Asymptomatic microscopic hematuria: Secondary | ICD-10-CM | POA: Diagnosis not present

## 2022-02-18 DIAGNOSIS — M6281 Muscle weakness (generalized): Secondary | ICD-10-CM | POA: Diagnosis not present

## 2022-02-18 DIAGNOSIS — R29810 Facial weakness: Secondary | ICD-10-CM | POA: Diagnosis not present

## 2022-02-18 DIAGNOSIS — R350 Frequency of micturition: Secondary | ICD-10-CM | POA: Diagnosis not present

## 2022-02-18 DIAGNOSIS — R2681 Unsteadiness on feet: Secondary | ICD-10-CM | POA: Diagnosis not present

## 2022-02-18 DIAGNOSIS — N401 Enlarged prostate with lower urinary tract symptoms: Secondary | ICD-10-CM | POA: Diagnosis not present

## 2022-02-18 NOTE — Therapy (Addendum)
OUTPATIENT PHYSICAL THERAPY TREATMENT NOTE/Discharge   Patient Name: [name] MRN: [id number] DOB:08/10/1955, 66 y.o., male Today's Date: 02/18/2022  PCP: Irven Coe, MD     REFERRING PROVIDER: Vivi Barrack, DPM  END OF SESSION:   PT End of Session - 02/18/22 0718     Visit Number 5    Number of Visits 13    Date for PT Re-Evaluation 03/25/22    Authorization Type [employer] OTHER    PT Start Time 0715    PT Stop Time 0745    PT Time Calculation (min) 30 min             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   Semantic vs logopenic   Pure hypercholesterolemia    Renal cyst    Shoulder joint pain, right    Wears glasses    Past Surgical History:  Procedure Laterality Date   TONSILLECTOMY  age 17   TRANSURETHRAL RESECTION OF PROSTATE N/A 02/05/2018   Procedure: TRANSURETHRAL RESECTION OF THE PROSTATE (TURP);  Surgeon: Malen Gauze, MD;  Location: [facility];  Service: Urology;  Laterality: N/A;   Patient Active Problem List   Diagnosis Date Noted   PAD (peripheral artery disease) (HCC) 02/08/2022   Increased frequency of urination 03/24/2021   Major neurocognitive disorder 03/24/2021   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   Fatty liver    Hardening of the aorta (main artery of the heart)    Insomnia    Pain in right hip    Personality disorder    Pure hypercholesterolemia    Renal cyst    Shoulder joint pain, right    Male erectile dysfunction 12/21/2019   Benign prostatic hyperplasia without lower urinary tract symptoms 02/05/2018    REFERRING  DIAG: R26.81 (ICD-10-CM) - Gait instability   THERAPY DIAG:  Other abnormalities of gait and mobility  Muscle weakness (generalized)  Rationale for Evaluation and Treatment Rehabilitation  PERTINENT HISTORY: Major neurocognitive disorder, Primary progressive aphasia; HAV  PRECAUTIONS: None  SUBJECTIVE:  SUBJECTIVE STATEMENT:  No pain today. Feeling okay. Per family: they have not had a chance to order the ankle brace due to the holidays.    PAIN:  Are you having pain? Yes: NPRS scale: 0/10 Pain location: Great toe Pain description: ache Aggravating factors: walking Relieving factors: ? Pt was not experience great toe pain today, but wife notes it can be significant.   OBJECTIVE: (objective measures completed at initial evaluation unless otherwise dated)   DIAGNOSTIC FINDINGS:  MRI 09/20/21 IMPRESSION: 1. No acute or subacute insult, stable from 03/16/2021 brain MRI. 2. Severe bilateral anterior temporal encephalomalacia. 3. Mild chronic white matter disease.   PATIENT SURVEYS:  Not appropriate for pt   COGNITION: Overall cognitive status: History of cognitive impairments - at baseline                              SENSATION: WFL   EDEMA:  Not assessed   MUSCLE LENGTH: Hamstrings: Right NA deg; Left NA deg Maisie Fus test: Right NA deg; Left NA deg   POSTURE:  Stands with R hip ER and decreased ankle DF    PALPATION: TTP of R great toe   LOWER EXTREMITY ROM:   Active ROM Right eval Left eval  Hip flexion      Hip extension      Hip abduction      Hip adduction      Hip internal rotation      Hip external rotation      Knee flexion 120 130  Knee extension      Ankle dorsiflexion 10 lacking, passive not sig limitation    Ankle plantarflexion      Ankle inversion      Ankle  eversion       (Blank rows = not tested)   LOWER EXTREMITY MMT:   MMT Right eval Left eval Right 02/18/22  Hip flexion 2 4+ 3+  Hip extension 2 4+ NT  Hip abduction 2 4+ 3+  Hip adduction 2 4+ 3+  Hip internal rotation 2 4+ 2  Hip external rotation 2 4+ 3+  Knee flexion 2 5 3   Knee extension 2 5 4   Ankle dorsiflexion     3  Ankle plantarflexion     2  Ankle inversion       Ankle eversion        (Blank rows = not tested)   LOWER EXTREMITY SPECIAL TESTS:  NT   FUNCTIONAL TESTS:  02/08/22: 5 times sit to stand: 49 sec (mat table with UE use) ; 02/18/22: bariatric chair 15.9 sec             02/08/22: : 249 feet; 02/18/22: 334 Feet            Berg: TBA GAIT: Distance walked: 246ft Assistive device utilized: None Level of assistance: SBA for safety Comments: Walks c R hip in ER, decreased knee ROM, decreased ankle. Rolls over  great toe/medial foot during stance phase of gait.     TODAY'S TREATMENT: [facility] Adult PT Treatment:                                                DATE: 02/18/22 Therapeutic Exercise: Nustep L4 x 5 minutes Seated heel and toe raises Seated marching Seated RLE DF with strap  Seated R hamstring stretch EOM  Therapeutic Activity: MMT 2 MWT 5 x STS  Self Care: Discussed with family member the benefit of AFO  [facility] Adult PT Treatment:                                                DATE: 02/16/22 Therapeutic Exercise: Nustep L5 intermittent use of UE x 5 minutes  Slant board bilat stretch Step up forward RLE to 4 inch step Step taps to 4 inch step Rocker A/P  AIREX- head turns and nods, EC Airex- narrow stance- Head turns with LOB , EC  Tandem stance trials- 5 sec x 5  STS x 11 reps - cues to reach out front to promote weight shift.  Seated March- assist with UE to reach full R hip flexion , then eccentric lowering  Seated ball squeeze  Seated DF- 2# on forefoot , 10 x 2   Therapeutic Activity: Cues throughout session for step length  and neutral LE   [facility] Adult PT Treatment:                                                DATE: 02/10/22 Therapeutic Exercise: Seated DF 2x10  STS completing toes forward, equal weight bearing, with and s use of hands 2x10 Standing Gastroc stretch at wall 2 x 30 sec each with tactile and verbal cues Therapeutic Activity: Gait training with DF assist. Verbal cueing for forwar step with toes forward to minimize outing Self Care: Discussed the purpose of a DF brace/strap and provided information to pt's wife to obtain    [facility] Adult PT Treatment:                                                DATE: 02/08/22 Therapeutic Exercise: Nustep L4 UE/LE x 5 minutes  Seated wooden rocker board A/P - tactile and verbal cues for correct technique Seated heel and toe  raises- tactile cues for RLE Seated EOM Hamstring stretch 10 sec x 3 each with knee ext over pressure +HEP Gastroc stretch with strap 30 sec x 2 bilat  +HEP Standing Gastroc stretch at wall 2 x 30 sec each with tactile and verbal cues  Therapeutic Activity: 2 MWT: 249 feet 5 x STS 49 seconds     Eval  No treatment provided on eval                                                                                                                               PATIENT EDUCATION:  Education details: Eval findings, POC Person educated: Patient and Spouse Education method: Explanation Education comprehension: verbalized understanding   HOME EXERCISE PROGRAM: To be provided 02/08/22: provided HEP 2 GO Handout: seated DF stretch with Strap and Seated EOM hamstring stretch 3 x 30 sec each LE , 1 x per day Access Code: [email or web address] URL: [email or web address] Date: 02/18/2022 Prepared by: Jannette Spanner  Exercises - Seated Calf Stretch with Strap  - 1 x daily - 7 x weekly - 3 sets - 10 reps - Seated Hamstring Stretch  - 1 x daily - 7 x weekly - 3 sets - 10 reps - Seated March  - 1 x daily - 7 x weekly - 2 sets - 10 reps -  Seated Heel Toe Raises  - 1 x daily - 7 x weekly - 2 sets - 10 reps   ASSESSMENT:   CLINICAL IMPRESSION:  PT was completed to promote R ankle DF balance and LE strength. Pt's family member asked for this to be his last treatment due to insurance concerns. Pt has made improvements in 5 x STS and 2 MWT, meeting that LTG. His RLE has also improved however he remains weak in Right hamstings and ankle. A trial of AFO in clinic improved patient's gait mechanics and was recommended to family member for further discussion with MD. He was given a basic seated HEP to improve RLE DF ROM and strength. Will discharge to HEP today per patient's family request.     OBJECTIVE IMPAIRMENTS: Abnormal gait, difficulty walking, decreased ROM, decreased strength, improper body mechanics, and pain.    ACTIVITY LIMITATIONS: carrying, lifting, standing, squatting, and locomotion level   PARTICIPATION LIMITATIONS: meal prep, cleaning, laundry, driving, and occupation   PERSONAL FACTORS: Fitness, Past/current experiences, Time since onset of injury/illness/exacerbation, and 1-2 comorbidities: Major neurocognitive disorder, Primary progressive aphasia; HAV  are also affecting patient's functional outcome.    REHAB POTENTIAL: Fair due to comorbidities   CLINICAL DECISION MAKING: Evolving/moderate complexity   EVALUATION COMPLEXITY: Moderate     GOALS:   SHORT TERM GOALS: Target date: 02/25/21 Pt will be Ind in an initial HEP Baseline: To be initiated Goal status: MET   2.  Assess pt's gait pattern with DF assist Baseline: to be completed Goal status: MET   LONG TERM GOALS: Target date: 03/25/21   Pt will be Ind in a final HEP to maintain achieved LOF  Baseline: To be initiated Goal status: PARTIALLY MET- DC early due to financial reasons   2.  Increase R hip, knee, and ankle strength to 3/5 Baseline: 2/5 02/18/22: See chart Goal status: PARTIALLY MET   3.  Pt will walk with decreased out toeing and  increased DF for an improved gait pattern and to decrease the intensity and frequency of R great toe pain Baseline: abnormal gait pattern 02/18/22: showed improvement in clinic with trial of AFO/ plans to discuss with MD Goal status: NOT MET    4.  Improve Berg balance by MCID of 7 for improved safety Baseline: TBA Goal status: DEFERRED- Discharged early due to financial reasons   5.  Improve 5xSTS by MCID of 5" and by MCID of 66ft as indication of improved functional mobility  Baseline: TBA-see functional Tests Goal status: MET   PLAN:   PT FREQUENCY: 2x/week   PT DURATION: 6 weeks   PLANNED INTERVENTIONS: Therapeutic exercises, Therapeutic activity, Neuromuscular re-education, Balance training, Gait training, Patient/Family education, Self Care, Joint mobilization, Cryotherapy, Moist heat, Taping, Manual therapy, and Re-evaluation,  TPDN   PLAN FOR NEXT SESSION: Discharge to HEP due to financial reasons/ insurance concerns   Jannette Spanner, [place] 02/18/22 7:56 AM Phone: [phone] Fax: [phone]   PHYSICAL THERAPY DISCHARGE SUMMARY  Visits from Start of Care: 5  Current functional level related to goals / functional outcomes: See clinical impression and PT goals    Remaining deficits: See clinical impression and PT goals    Education / Equipment: HEP   Patient agrees to discharge. Patient goals were partially met. Patient is being discharged due to financial reasons.

## 2022-02-22 ENCOUNTER — Ambulatory Visit: Payer: BC Managed Care – PPO | Admitting: Physical Therapy

## 2022-02-24 ENCOUNTER — Ambulatory Visit: Payer: BC Managed Care – PPO

## 2022-02-25 ENCOUNTER — Ambulatory Visit (INDEPENDENT_AMBULATORY_CARE_PROVIDER_SITE_OTHER): Payer: BC Managed Care – PPO

## 2022-02-25 ENCOUNTER — Ambulatory Visit
Admission: EM | Admit: 2022-02-25 | Discharge: 2022-02-25 | Disposition: A | Discharge status: Home / Self Care | Payer: BC Managed Care – PPO | Attending: Physician Assistant | Admitting: Physician Assistant

## 2022-02-25 DIAGNOSIS — R519 Headache, unspecified: Secondary | ICD-10-CM

## 2022-02-25 DIAGNOSIS — S0992XA Unspecified injury of nose, initial encounter: Secondary | ICD-10-CM

## 2022-02-25 DIAGNOSIS — S0181XA Laceration without foreign body of other part of head, initial encounter: Secondary | ICD-10-CM

## 2022-02-25 DIAGNOSIS — S0083XA Contusion of other part of head, initial encounter: Secondary | ICD-10-CM

## 2022-02-25 DIAGNOSIS — S022XXA Fracture of nasal bones, initial encounter for closed fracture: Secondary | ICD-10-CM

## 2022-02-25 MED ORDER — CEPHALEXIN 500 MG PO CAPS: 500.0000 mg | ORAL_CAPSULE | Freq: Two times a day (BID) | ORAL | 0 refills | Status: DC

## 2022-02-25 NOTE — ED Triage Notes (Addendum)
[facility] of facial injuries from losing his balance while walking dog at 11:30. Pt wife reports he was found down by a passerby who called [facility] and brought him home. Pt wife reports early dementia and he doesn't remember what happened. Pt wife reports not on blood thinners

## 2022-02-25 NOTE — Discharge Instructions (Signed)
Advised to take the Keflex 500 mg twice daily to help prevent infection over the next 5 days as the facial abrasions heal. Advised to give Tylenol or ibuprofen if needed for facial discomfort and pain. Advised to return to urgent care for suture removal in 7 days.  Advised to follow-up PCP return to urgent care as needed.

## 2022-02-25 NOTE — ED Provider Notes (Addendum)
[name]    CSN: [id number] Arrival date & time: 02/25/22  1255      History   Chief Complaint Chief Complaint  Patient presents with   Fall    HPI [name] is a 67 y.o. male.   67 year old male presents with facial abrasions, bilateral hand abrasions.  Wife is present in the exam room and indicates that the patient has dementia.  Wife indicates this morning around 1030 he was walking the dog, when he tripped and fell striking his face and hands against the gravel concrete.  Patient indicates that he has abrasions on the forehead, bridge of the nose, upper lip area, and bilateral hands.  Patient does not know if he had LOC due to dementia.  Patient indicates he has not had any headache, vision changes, nausea or vomiting.  Wife indicates his last tetanus injection was 2019.   Fall    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   Semantic vs logopenic   Pure hypercholesterolemia    Renal cyst    Shoulder joint pain, right    Wears glasses     Patient Active Problem List   Diagnosis Date Noted   PAD (peripheral artery disease) (HCC) 02/08/2022   Increased frequency of urination 03/24/2021   Major neurocognitive disorder 03/24/2021   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   Fatty liver    Hardening of the aorta (main artery of the heart)    Insomnia    Pain in right hip    Personality disorder    Pure hypercholesterolemia    Renal cyst    Shoulder joint pain, right    Male erectile dysfunction 12/21/2019   Benign prostatic hyperplasia without lower urinary tract symptoms 02/05/2018     Past Surgical History:  Procedure Laterality Date   TONSILLECTOMY  age 59   TRANSURETHRAL RESECTION OF PROSTATE N/A 02/05/2018   Procedure: TRANSURETHRAL RESECTION OF THE PROSTATE (TURP);  Surgeon: Malen Gauze, MD;  Location: [facility];  Service: Urology;  Laterality: N/A;       Home Medications    Prior to Admission medications   Medication Sig Start Date End Date Taking? Authorizing Provider  cephALEXin (KEFLEX) 500 MG capsule Take 1 capsule (500 mg total) by mouth 2 (two) times daily. 02/25/22  Yes Ellsworth Lennox, PA-C  acetaminophen (TYLENOL) 325 MG tablet 1 tablet as needed    [provider]  diclofenac Sodium (VOLTAREN) 1 % GEL Apply 2 g topically 4 (four) times daily. Rub into affected area of foot 2 to 4 times daily 01/21/22   Vivi Barrack, DPM  ketoconazole (NIZORAL) 2 % cream APPLY TO AFFECTED AREA TWICE A DAY 12/14/21   [provider]  memantine (NAMENDA) 10 MG tablet Take by mouth. 09/08/21   [provider]  mirabegron ER (MYRBETRIQ) 25 MG TB24 tablet Take 1 tablet (25 mg total) by mouth 2 (two) times daily. 02/06/18   Crista Elliot, MD  QUEtiapine (SEROQUEL) 25 MG tablet Take 1 tablet (25 mg total) by mouth at bedtime. 01/12/22  Rondel Jumbo, PA-C  rivastigmine (EXELON) 1.5 MG capsule Take 1 capsule (1.5 mg total) by mouth 2 (two) times daily. Take one capsule 2 times a day 09/17/21   Rondel Jumbo, PA-C  terbinafine (LAMISIL) 250 MG tablet Take 250 mg by mouth daily. 01/03/22   [provider]    Family History Family History  Problem Relation Age of Onset   Dementia Father     Social History Social History   Tobacco Use   Smoking status: Never   Smokeless tobacco: Never  Vaping Use   Vaping Use: Never used  Substance Use Topics   Alcohol use: Not Currently    Comment: occasional   Drug use: Never     Allergies   Patient has no known allergies.   Review of Systems Review of  Systems   Physical Exam Triage Vital Signs ED Triage Vitals  Enc Vitals Group     BP 02/25/22 1324 (!) 164/88     Pulse Rate 02/25/22 1323 73     Resp 02/25/22 1323 19     Temp 02/25/22 1323 98 F (36.7 C)     Temp src --      SpO2 02/25/22 1323 98 %     Weight --      Height --      Head Circumference --      Peak Flow --      Pain Score 02/25/22 1322 0     Pain Loc --      Pain Edu? --      Excl. in [place]? --    No data found.  Updated Vital Signs BP (!) 164/88   Pulse 73   Temp 98 F (36.7 C)   Resp 19   SpO2 98%   Visual Acuity Right Eye Distance:   Left Eye Distance:   Bilateral Distance:    Right Eye Near:   Left Eye Near:    Bilateral Near:     Physical Exam Constitutional:      Appearance: Normal appearance.  HENT:     Head:      Comments: Facial: There are several superficial abrasions on the forehead, bridge of the nose, lower part of the left side of the nose, and upper left portion of the lip which includes a small laceration present.  Minimal swelling in these areas are noted.  Jaw movement, opening and closing of the jaw is normal without crepitus.  Procedure: The upper lip laceration 1cm was cleaned with Betadine, irrigated with normal saline, anesthetized with lidocaine half cc injected.  The area was repaired with 5.0 Ethilon, 2 sutures used, the skin was brought together and approximated as well as possible due to the degree of abrasion to the area.  No complications, minimal swelling is present.    Mouth/Throat:     Mouth: Mucous membranes are moist.     Pharynx: Oropharynx is clear. Uvula midline.  Musculoskeletal:       Arms:     Cervical back: Normal range of motion.     Comments: Bilateral hands with small abrasions that are present posteriorly, full range of motion is normal, flexion extension is normal all its bilaterally.  Wrist and hand rotation is normal bilaterally, without restriction.  Neurological:     Mental Status: He is  alert.      UC Treatments / Results  Labs (all labs ordered are listed, but only abnormal results are displayed) Labs Reviewed - No data to  display  EKG   Radiology DG Nasal Bones  Result Date: 02/25/2022 CLINICAL DATA:  Trauma, fall EXAM: NASAL BONES - 3+ VIEW COMPARISON:  None Available. FINDINGS: There is faint lucency in the nasal bones 6 mm from the tip. Anterior nasal spine of maxilla appears intact. Visualized paranasal sinuses are clear. IMPRESSION: There is faint lucency in nasal bones close to the tip suggesting recent or old undisplaced fracture. Electronically Signed   By: Elmer Picker M.D.   On: 02/25/2022 14:11    Procedures Procedures (including critical care time)  Medications Ordered in UC Medications - No data to display  Initial Impression / Assessment and Plan / UC Course  I have reviewed the triage vital signs and the nursing notes.  Pertinent labs & imaging results that were available during my care of the patient were reviewed by me and considered in my medical decision making (see chart for details).    Plan: 1.  The facial laceration at the upper left side of the lip will be treated with the following: A.  Laceration repair and patient advised to return for suture removal in 7 days. B.  Keflex 500 mg twice daily for 5 days to prevent infection. 2.  The contusion of the face and abrasions will be treated with the following: A.  Keflex 500 mg twice daily to prevent infection. B.  Ibuprofen on a regular basis or Tylenol to prevent swelling and discomfort. 3.  The facial pain will be treated with the following: A.  Ibuprofen or Tylenol to be taken to help relieve the discomfort or pain. 4.  Advised to follow-up PCP or return to urgent care if symptoms fail to improve. Final Clinical Impressions(s) / UC Diagnoses   Final diagnoses:  Facial pain  Contusion of face, initial encounter  Facial laceration, initial encounter  Closed fracture of nasal  bone, initial encounter     Discharge Instructions      Advised to take the Keflex 500 mg twice daily to help prevent infection over the next 5 days as the facial abrasions heal. Advised to give Tylenol or ibuprofen if needed for facial discomfort and pain. Advised to return to urgent care for suture removal in 7 days.  Advised to follow-up PCP return to urgent care as needed.    ED Prescriptions     Medication Sig Dispense Auth. Provider   cephALEXin (KEFLEX) 500 MG capsule Take 1 capsule (500 mg total) by mouth 2 (two) times daily. 10 capsule Nyoka Lint, PA-C      PDMP not reviewed this encounter.   Nyoka Lint, PA-C 02/25/22 1428    Nyoka Lint, PA-C 02/25/22 1525

## 2022-02-25 NOTE — ED Notes (Signed)
Cleansed wounds on face with hipa-cleanse and applied bacitracin to area

## 2022-02-28 DIAGNOSIS — E7800 Pure hypercholesterolemia, unspecified: Secondary | ICD-10-CM | POA: Diagnosis not present

## 2022-03-01 ENCOUNTER — Encounter: Payer: Self-pay | Admitting: Emergency Medicine

## 2022-03-01 ENCOUNTER — Ambulatory Visit
Admission: EM | Admit: 2022-03-01 | Discharge: 2022-03-01 | Disposition: A | Discharge status: Home / Self Care | Payer: BC Managed Care – PPO

## 2022-03-01 DIAGNOSIS — S0181XS Laceration without foreign body of other part of head, sequela: Secondary | ICD-10-CM

## 2022-03-01 NOTE — ED Triage Notes (Signed)
Pt presents for suture removal. Sutures placed on 1/05. Pt requesting to be seen by provider for wound check on face.

## 2022-03-01 NOTE — ED Triage Notes (Signed)
Two sutures removal above lip. Pt tolerated well. Left open to air.

## 2022-03-03 ENCOUNTER — Encounter: Payer: 59 | Admitting: Physical Therapy

## 2022-03-09 DIAGNOSIS — M79675 Pain in left toe(s): Secondary | ICD-10-CM | POA: Diagnosis not present

## 2022-03-09 DIAGNOSIS — B351 Tinea unguium: Secondary | ICD-10-CM | POA: Diagnosis not present

## 2022-03-09 DIAGNOSIS — L600 Ingrowing nail: Secondary | ICD-10-CM | POA: Diagnosis not present

## 2022-03-09 DIAGNOSIS — M79674 Pain in right toe(s): Secondary | ICD-10-CM | POA: Diagnosis not present

## 2022-03-10 ENCOUNTER — Encounter: Payer: 59 | Admitting: Physical Therapy

## 2022-03-11 ENCOUNTER — Telehealth: Payer: Self-pay | Admitting: Physician Assistant

## 2022-03-11 NOTE — Telephone Encounter (Signed)
Pt's wife called in wanting to see if the pt could come in sooner for an appointment. He just saw his sports medicine doctor and he wants the pt to see Clarise Cruz about his weakness.

## 2022-03-11 NOTE — Telephone Encounter (Signed)
[name] is out of office will call patient

## 2022-03-11 NOTE — Telephone Encounter (Signed)
Advised to see  PCP, she will follow up as scheduled.

## 2022-03-15 ENCOUNTER — Encounter: Payer: 59 | Admitting: Physical Therapy

## 2022-03-30 ENCOUNTER — Telehealth: Payer: Self-pay

## 2022-03-30 ENCOUNTER — Other Ambulatory Visit: Payer: Self-pay | Admitting: Physician Assistant

## 2022-03-31 ENCOUNTER — Telehealth: Payer: Self-pay | Admitting: Physician Assistant

## 2022-03-31 ENCOUNTER — Ambulatory Visit (INDEPENDENT_AMBULATORY_CARE_PROVIDER_SITE_OTHER): Payer: Medicare Other | Admitting: Podiatry

## 2022-03-31 ENCOUNTER — Ambulatory Visit (INDEPENDENT_AMBULATORY_CARE_PROVIDER_SITE_OTHER): Payer: Medicare Other

## 2022-03-31 ENCOUNTER — Telehealth: Payer: Self-pay | Admitting: Anesthesiology

## 2022-03-31 DIAGNOSIS — M79671 Pain in right foot: Secondary | ICD-10-CM

## 2022-03-31 DIAGNOSIS — M2011 Hallux valgus (acquired), right foot: Secondary | ICD-10-CM

## 2022-03-31 DIAGNOSIS — M7751 Other enthesopathy of right foot: Secondary | ICD-10-CM

## 2022-03-31 NOTE — Telephone Encounter (Signed)
Advised to contact PCP for recommendations

## 2022-03-31 NOTE — Telephone Encounter (Signed)
No further information is needed 

## 2022-03-31 NOTE — Telephone Encounter (Signed)
Pt's wife [name] left message with AN stating her husband has change in behavior. He is talking to himself, to imaginary people and waves at billboards. He is on rivastigmine for his dementia. States she would like to speak to Clarise Cruz about her husbands condition.

## 2022-03-31 NOTE — Progress Notes (Signed)
Subjective:  Chief Complaint  Patient presents with   Foot Pain    Right foot, pain started 6 months ago, pain starts with the great toe and radiates to the other toes    67 year old male presents the office today with his family for the above concerns.  No recent injuries that they report.  The other day all the toes are swollen but not today.  Most of his pain is on the big toe joint.  He is previously no other providers as well and had injections which were not helpful.  Objective: AAO x3, NAD DP/PT pulses palpable bilaterally, CRT less than 3 seconds Prominent bunion present on the right foot without any erythema or warmth.  Minimal edema.  There is tenderness in the first MPJ there decreased range of motion of first MPJ.  Not able to appreciate tenderness the other digits today there is no significant swelling to the toes today. No pain with calf compression, swelling, warmth, erythema  Assessment: Bunion, capsulitis right foot  Plan: -All treatment options discussed with the patient including all alternatives, risks, complications.  -Does not clinically appear to be gout.  Difficult situation given his bunion.  Can consider surgical intervention if needed.  Were not tried offloading pads were dispensed as well as a graphite insert.  She modifications to avoid excess pressure. -Patient encouraged to call the office with any questions, concerns, change in symptoms.   Trula Slade DPM

## 2022-04-04 ENCOUNTER — Ambulatory Visit: Payer: Medicare PPO | Admitting: Physical Therapy

## 2022-04-06 ENCOUNTER — Encounter: Payer: BC Managed Care – PPO | Admitting: Physical Therapy

## 2022-04-06 ENCOUNTER — Other Ambulatory Visit: Payer: Self-pay

## 2022-04-06 MED ORDER — RIVASTIGMINE TARTRATE 1.5 MG PO CAPS: ORAL_CAPSULE | ORAL | 1 refills | Status: DC

## 2022-04-06 NOTE — Addendum Note (Signed)
Addended by: Elspeth Cho R on: 04/06/2022 01:24 PM   Modules accepted: Orders

## 2022-04-19 ENCOUNTER — Telehealth: Payer: Self-pay

## 2022-04-22 NOTE — Telephone Encounter (Signed)
Spoke to the patient daughter and she states the swelling is more in his foot and ankle and she did agree to doing an MRI for further evaluation.

## 2022-04-25 ENCOUNTER — Other Ambulatory Visit: Payer: Self-pay | Admitting: Podiatry

## 2022-04-25 DIAGNOSIS — M7751 Other enthesopathy of right foot: Secondary | ICD-10-CM

## 2022-04-25 NOTE — Telephone Encounter (Signed)
Orders faxed over to [facility] today.

## 2022-04-26 ENCOUNTER — Ambulatory Visit: Payer: BC Managed Care – PPO | Admitting: Podiatry

## 2022-05-04 ENCOUNTER — Encounter: Payer: Self-pay | Admitting: Speech Pathology

## 2022-05-04 ENCOUNTER — Ambulatory Visit: Payer: Medicare PPO | Attending: Podiatry | Admitting: Physical Therapy

## 2022-05-04 ENCOUNTER — Other Ambulatory Visit: Payer: Self-pay

## 2022-05-04 ENCOUNTER — Ambulatory Visit: Payer: Medicare PPO | Admitting: Speech Pathology

## 2022-05-04 DIAGNOSIS — R41841 Cognitive communication deficit: Secondary | ICD-10-CM

## 2022-05-04 DIAGNOSIS — R2681 Unsteadiness on feet: Secondary | ICD-10-CM | POA: Insufficient documentation

## 2022-05-04 DIAGNOSIS — R2689 Other abnormalities of gait and mobility: Secondary | ICD-10-CM | POA: Diagnosis not present

## 2022-05-04 DIAGNOSIS — M6281 Muscle weakness (generalized): Secondary | ICD-10-CM | POA: Diagnosis present

## 2022-05-04 DIAGNOSIS — R4701 Aphasia: Secondary | ICD-10-CM | POA: Insufficient documentation

## 2022-05-04 NOTE — Therapy (Deleted)
OUTPATIENT SPEECH LANGUAGE PATHOLOGY EVALUATION   Patient Name: [name] MRN: [id number] DOB:08/06/1955, 67 y.o., male Today's Date: 05/04/2022  PCP: Collene Leyden, MD REFERRING PROVIDER: Collene Leyden, MD  END OF SESSION:   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   Semantic vs logopenic   Pure hypercholesterolemia    Renal cyst    Shoulder joint pain, right    Wears glasses    Past Surgical History:  Procedure Laterality Date   TONSILLECTOMY  age 32   TRANSURETHRAL RESECTION OF PROSTATE N/A 02/05/2018   Procedure: TRANSURETHRAL RESECTION OF THE PROSTATE (TURP);  Surgeon: Cleon Gustin, MD;  Location: [facility];  Service: Urology;  Laterality: N/A;   Patient Active Problem List   Diagnosis Date Noted   PAD (peripheral artery disease) ([place]) 02/08/2022   Increased frequency of urination 03/24/2021   Major neurocognitive disorder 03/24/2021   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   Fatty liver    Hardening of the aorta (main artery of the heart)    Insomnia    Pain in right hip    Personality disorder    Pure hypercholesterolemia    Renal cyst    Shoulder joint pain, right    Male erectile dysfunction 12/21/2019   Benign prostatic hyperplasia without lower urinary tract symptoms 02/05/2018    ONSET DATE: 04-29-2022 referral   REFERRING DIAG: F01.50 (ICD-10-CM) - Vascular dementia, unspecified severity, without behavioral disturbance, psychotic disturbance, mood disturbance, and anxiety   THERAPY DIAG:  No diagnosis found.  Rationale for Evaluation and Treatment:  Habilitation  SUBJECTIVE:   SUBJECTIVE STATEMENT: *** Pt accompanied by: {accompnied:27141}  PERTINENT HISTORY: neuropsychological evaluation by Dr. Melvyn Novas, remarkable for concerns the severity of his language impairment , raising particular concerns for the presence of a primary progressive aphasia (PPA) presentation. However, within this presentation, identifying the most likely subtype is quite challenging.    PAIN:  Are you having pain? {OPRCPAIN:27236}  FALLS: Has patient fallen in last 6 months?  [id number]  LIVING ENVIRONMENT: Lives with: {OPRC lives with:25569::"lives with their family"} Lives in: {Lives in:25570}  PLOF:  Level of assistance: [id number] Employment: {SLPemployment:25674}  PATIENT GOALS: ***  OBJECTIVE:   DIAGNOSTIC FINDINGS: ***  COGNITION: Overall cognitive status: {cognition:24006} Areas of impairment:  {cognitiveimpairmentslp:27409} Functional deficits: ***  COGNITIVE COMMUNICATION: Following directions: {commands:24018}  Auditory comprehension: {WFL-Impaired:25365} Verbal expression: {WFL-Impaired:25365} Functional communication: {WFL-Impaired:25365}  ORAL MOTOR EXAMINATION: Overall status: {OMESLP2:27645} Comments: ***  STANDARDIZED ASSESSMENTS: {SLPstandardizedassessment:27092}  PATIENT REPORTED OUTCOME MEASURES (PROM): {SLPPROM:27095}   TODAY'S TREATMENT:  DATE: ***   PATIENT EDUCATION: Education details: *** Person educated: {Person educated:25204} Education method: {Education Method:25205} Education comprehension: {Education Comprehension:25206}   GOALS: Goals reviewed with patient? {yes/no:20286}  SHORT TERM GOALS: Target date: ***  *** Baseline: Goal status: {GOALSTATUS:25110}  2.  *** Baseline:  Goal status: {GOALSTATUS:25110}  3.  *** Baseline:  Goal status:  {GOALSTATUS:25110}  4.  *** Baseline:  Goal status: {GOALSTATUS:25110}  5.  *** Baseline:  Goal status: {GOALSTATUS:25110}  6.  *** Baseline:  Goal status: {GOALSTATUS:25110}  LONG TERM GOALS: Target date: ***  *** Baseline:  Goal status: {GOALSTATUS:25110}  2.  *** Baseline:  Goal status: {GOALSTATUS:25110}  3.  *** Baseline:  Goal status: {GOALSTATUS:25110}  4.  *** Baseline:  Goal status: {GOALSTATUS:25110}  5.  *** Baseline:  Goal status: {GOALSTATUS:25110}  6.  *** Baseline:  Goal status: {GOALSTATUS:25110}  ASSESSMENT:  CLINICAL IMPRESSION: Patient is a *** y.o. *** who was seen today for ***.   OBJECTIVE IMPAIRMENTS: include {SLPOBJIMP:27107}. These impairments are limiting patient from {SLPLIMIT:27108}. Factors affecting potential to achieve goals and functional outcome are {SLP factors:25450}.. Patient will benefit from skilled SLP services to address above impairments and improve overall function.  REHAB POTENTIAL: {rehabpotential:25112}  PLAN:  SLP FREQUENCY: {rehab frequency:25116}  SLP DURATION: {rehab duration:25117}  PLANNED INTERVENTIONS: {SLP treatment/interventions:25449}    Su Monks, CCC-SLP 05/04/2022, 8:42 AM

## 2022-05-04 NOTE — Therapy (Signed)
OUTPATIENT PHYSICAL THERAPY LOWER EXTREMITY EVALUATION   Patient Name: [name] MRN: [id number] DOB:Jan 27, 1956, 67 y.o., male Today's Date: 05/04/2022  END OF SESSION:  PT End of Session - 05/04/22 1106     Visit Number 1    Number of Visits 9   with eval   Date for PT Re-Evaluation 06/15/22   to allow for scheduling delays   Authorization Type [employer]    PT Start Time 1107   pt in restroom   PT Stop Time 1137   evaluation   PT Time Calculation (min) 30 min    Equipment Utilized During Treatment Gait belt    Activity Tolerance Patient tolerated treatment well    Behavior During Therapy WFL for tasks assessed/performed;Impulsive             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   Semantic vs logopenic   Pure hypercholesterolemia    Renal cyst    Shoulder joint pain, right    Wears glasses    Past Surgical History:  Procedure Laterality Date   TONSILLECTOMY  age 83   TRANSURETHRAL RESECTION OF PROSTATE N/A 02/05/2018   Procedure: TRANSURETHRAL RESECTION OF THE PROSTATE (TURP);  Surgeon: Cleon Gustin, MD;  Location: [facility];  Service: Urology;  Laterality: N/A;   Patient Active Problem List   Diagnosis Date Noted   PAD (peripheral artery disease) ([place]) 02/08/2022   Increased frequency of urination 03/24/2021   Major neurocognitive disorder 03/24/2021   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   Fatty liver    Hardening of the aorta (main artery of the heart)    Insomnia    Pain in right hip    Personality disorder    Pure hypercholesterolemia    Renal cyst     Shoulder joint pain, right    Male erectile dysfunction 12/21/2019   Benign prostatic hyperplasia without lower urinary tract symptoms 02/05/2018    PCP: Collene Leyden, MD  REFERRING PROVIDER: Collene Leyden, MD  REFERRING DIAG: R29.898 (ICD-10-CM) - Right leg weakness  THERAPY DIAG:  Other abnormalities of gait and mobility  Muscle weakness (generalized)  Unsteadiness on feet  Rationale for Evaluation and Treatment: Rehabilitation  ONSET DATE: 04/29/2022  SUBJECTIVE:   SUBJECTIVE STATEMENT: Pt is poor historian due to cognitive impairments with diagnosis of neurocognitive disorder and vascular dementia. Pt's wife able to provide more accurate history. Per wife Coralyn Pear) pt has had gradual increase in RLE weakness over the past year with very frequent falls and slips. When pt falls his wife has to help him back up. Pt has been injured during falls, landed on his face one time and had to go to the hospital. Pt's wife does feel like his balance is getting worse. Pt is scheduled for an MRI of his R ankle and foot, awaiting a knee MRI to be scheduled.  PERTINENT HISTORY: Major neurocognitive disorder, Primary progressive aphasia; HAV  PAIN:  Are you having pain? No  PRECAUTIONS: Fall  WEIGHT BEARING RESTRICTIONS: No  FALLS:  Has patient fallen in last 6 months? Yes. Number of  falls too many to count "falls and slips", wife is catching him a lot, needs assist to get back up from the floor  LIVING ENVIRONMENT: Lives with: lives with their spouse Lives in: House/apartment Stairs: Yes: External: 6 steps; on right going up Has following equipment at home: Gaffer - 2 wheeled (pt refuses to use)  OCCUPATION: retired; enjoyed playing basketball  PLOF: Independent with gait and Independent with transfers  PATIENT GOALS: "improve balance, get R leg stronger"  NEXT MD VISIT: nothing scheduled  OBJECTIVE:   DIAGNOSTIC FINDINGS: pending R ankle and R foot  MRI  PATIENT SURVEYS:  LEFS 28/80, 35% of maximal function   COGNITION: Overall cognitive status: History of cognitive impairments - at baseline (vasular dementia, has gotten worse over the past year)   SENSATION: Unable to accurately assess due to cognitive impairments Unable to determine  EDEMA:  None present at eval, wife reports that R knee and R ankle swell (knee>ankle)  POSTURE: rounded shoulders and forward head; keep RLE in outstretched position in sitting  LOWER EXTREMITY ROM: WFL  LOWER EXTREMITY MMT:  MMT Right eval Left eval  Hip flexion 2- 5  Hip extension    Hip abduction    Hip adduction    Hip internal rotation    Hip external rotation    Knee flexion 2- 5  Knee extension 2- 5  Ankle dorsiflexion 2- 5  Ankle plantarflexion    Ankle inversion    Ankle eversion     (Blank rows = not tested)  FUNCTIONAL TESTS:    [facility] PT Assessment - 05/04/22 1124       Ambulation/Gait   Gait velocity 32.8 ft over 12.91 sec = 2.54 ft/sec      Standardized Balance Assessment   Standardized Balance Assessment Timed Up and Go Test;Five Times Sit to Stand;Berg Balance Test    Five times sit to stand comments  17.16   BUE on arms of chair     Berg Balance Test   Sit to Stand Able to stand  independently using hands    Standing Unsupported Able to stand 2 minutes with supervision    Sitting with Back Unsupported but Feet Supported on Floor or Stool Able to sit safely and securely 2 minutes    Stand to Sit Controls descent by using hands    Transfers Able to transfer safely, definite need of hands    Standing Unsupported with Eyes Closed Able to stand 10 seconds with supervision    Standing Unsupported with Feet Together Needs help to attain position and unable to hold for 15 seconds    From Standing, Reach Forward with Outstretched Arm Reaches forward but needs supervision    From Standing Position, Pick up Object from Floor Unable to try/needs assist to keep balance     From Standing Position, Turn to Look Behind Over each Shoulder Needs supervision when turning    Turn 360 Degrees Needs assistance while turning    Standing Unsupported, Alternately Place Feet on Step/Stool Needs assistance to keep from falling or unable to try    Standing Unsupported, One Foot in Front Needs help to step but can hold 15 seconds    Standing on One Leg Unable to try or needs assist to prevent fall    Total Score 22    Berg comment: 22/56, high fall risk      Timed Up and Go Test   TUG Normal TUG    Normal  TUG (seconds) 13.62   no AD, CGA            GAIT: Distance walked: various clinic distances Assistive device utilized: None Level of assistance: CGA Comments: antalgic, decreased R hip and knee flexion, decreased R ankle DF, circumduction of RLE, L lateral trunk shift   TODAY'S TREATMENT:                                                                                                                              PT Evaluation    PATIENT EDUCATION:  Education details: Eval findings, results of OM, PT POC Person educated: Patient and Spouse Education method: Explanation, Demonstration, Tactile cues, and Verbal cues Education comprehension: returned demonstration, verbal cues required, tactile cues required, and needs further education  HOME EXERCISE PROGRAM: To be initiated  ASSESSMENT:  CLINICAL IMPRESSION: Patient is a 67 year old male referred to Neuro OPPT for RLE weakness.   Pt's PMH is significant for: major neurocognitive disorder, primary progressive aphasia; HAV. The following deficits were present during the exam: decreased RLE strength, impaired balance, decreased safety and independence with functional mobility, cognitive impairments, and decreased overall functional level based on RLE impairments. Based on his gait speed of 2.54 ft/sec, BBS score of 22/56, and history of significant number of falls, pt is an increased risk for falls. Pt would  benefit from skilled PT to address these impairments and functional limitations to maximize functional mobility independence.   OBJECTIVE IMPAIRMENTS: Abnormal gait, decreased balance, decreased cognition, decreased coordination, decreased knowledge of condition, decreased knowledge of use of DME, decreased mobility, difficulty walking, decreased strength, decreased safety awareness, postural dysfunction, and pain.   ACTIVITY LIMITATIONS: carrying, lifting, bending, standing, squatting, stairs, transfers, and locomotion level  PARTICIPATION LIMITATIONS: driving, shopping, community activity, and yard work  PERSONAL FACTORS: Time since onset of injury/illness/exacerbation and 1-2 comorbidities:    Major neurocognitive disorder, Primary progressive aphasia; HAVare also affecting patient's functional outcome.   REHAB POTENTIAL: Fair time since onset, has tried PT before for this problem, cognitive impairments  CLINICAL DECISION MAKING: Unstable/unpredictable  EVALUATION COMPLEXITY: Moderate   GOALS: Goals reviewed with patient? Yes  SHORT TERM GOALS=LONG TERM GOALS due to length of POC  LONG TERM GOALS: Target date: 06/08/2022  Pt will be independent with final HEP for improved strength, balance, transfers and gait. Baseline:  Goal status: INITIAL  2.  Pt will improve gait velocity to at least 2.75 ft/sec for improved gait efficiency and performance at Supervision level  Baseline: 2.54 ft/sec no AD CGA (3/13) Goal status: INITIAL  3.  Pt will improve Berg score to 28/56 for decreased fall risk Baseline: 22/56 (3/13) Goal status: INITIAL  4.  Pt will improve LEFS score to 40% of maximum function (32/80) to demonstrate decreased disability level Baseline: 35% or 28/80 (3/13) Goal status: INITIAL    PLAN:  PT FREQUENCY: 2x/week  PT DURATION: 4 weeks  PLANNED INTERVENTIONS: Therapeutic exercises, Therapeutic activity, Neuromuscular  re-education, Balance training, Gait  training, Patient/Family education, Self Care, Joint mobilization, Stair training, Orthotic/Fit training, DME instructions, Taping, Manual therapy, and Re-evaluation  PLAN FOR NEXT SESSION: try foot up brace vs AFO, initiate HEP for RLE strengthening and for balance, SciFit vs NuStep   Excell Seltzer, PT, DPT, CSRS 05/04/2022, 2:39 PM

## 2022-05-04 NOTE — Therapy (Signed)
OUTPATIENT SPEECH LANGUAGE PATHOLOGY APHASIA EVALUATION   Patient Name: [name] MRN: [id number] DOB:1955-07-08, 67 y.o., male Today's Date: 05/04/2022  PCP: Collene Leyden, MD REFERRING PROVIDER: Collene Leyden, MD  END OF SESSION:  End of Session - 05/04/22 1605     Visit Number 1    Number of Visits 17    Date for SLP Re-Evaluation 06/29/22    SLP Start Time [id number]    SLP Stop Time  1100    SLP Time Calculation (min) 45 min    Activity Tolerance Patient tolerated treatment well             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   Semantic vs logopenic   Pure hypercholesterolemia    Renal cyst    Shoulder joint pain, right    Wears glasses    Past Surgical History:  Procedure Laterality Date   TONSILLECTOMY  age 36   TRANSURETHRAL RESECTION OF PROSTATE N/A 02/05/2018   Procedure: TRANSURETHRAL RESECTION OF THE PROSTATE (TURP);  Surgeon: Cleon Gustin, MD;  [facility];  Service: Urology;  Laterality: N/A;   Patient Active Problem List   Diagnosis Date Noted   PAD (peripheral artery disease) ([place]) 02/08/2022   Increased frequency of urination 03/24/2021   Major neurocognitive disorder 03/24/2021   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   Fatty liver    Hardening of the aorta (main artery of the heart)    Insomnia    Pain in right hip    Personality disorder    Pure hypercholesterolemia    Renal cyst    Shoulder joint pain, right    Male erectile dysfunction 12/21/2019   Benign prostatic hyperplasia without lower urinary tract symptoms 02/05/2018    ONSET DATE:  04/28/2022   REFERRING DIAG: F01.50 (ICD-10-CM) - Vascular dementia, unspecified severity, without behavioral disturbance, psychotic disturbance, mood disturbance, and anxiety  THERAPY DIAG:  Aphasia  Cognitive communication deficit  Rationale for Evaluation and Treatment: Habilitation  SUBJECTIVE:   SUBJECTIVE STATEMENT: 'He calls me "my mama" all the time Pt accompanied by: significant other [name]  PERTINENT HISTORY: major neurocognitive disorder, PPA, HAV. Neurospych eval Feb 2023, know to [place] from brief course of therapy (e visits) for cognition and aphasia. Pt did not return to therapy after the 3rd visit.  PAIN:  Are you having pain? Yes, unable to rate due to aphasia/cognition. Bilateral LE  FALLS: Has patient fallen in last 6 months?  Yes, See PT evaluation for details  LIVING ENVIRONMENT: Lives with: lives with their family and lives with their spouse Lives in: House/apartment  PLOF:  Level of assistance: Needed assistance with ADLs, Needed assistance with IADLS Employment: Retired  PATIENT GOALS: "To communicate better"  OBJECTIVE:    COGNITION: Overall cognitive status: Impaired Areas of impairment:  Attention: Impaired: Sustained, Selective Memory: Impaired: Working Industrial/product designer term Recruitment consultant Awareness: Impaired: Intellectual Executive function: Impaired: Initiation, Impulse control, Problem solving, Organization, Planning, Error awareness, Self-correction, and Slow processing Behavior: Restless, Impulsive, and Poor frustration tolerance Functional deficits:   AUDITORY COMPREHENSION: Overall auditory comprehension:  Impaired: simple YES/NO questions: Impaired: simple Following directions: Impaired: simple Conversation: Simple Interfering components: attention, awareness, and working memory Effective technique: extra processing time, repetition/stressing words, and slowed speech  READING COMPREHENSION: Impaired: word and  reading likely impaired since childhood - spouse unsure, but suspects he could not read prior to dementia/PPA  EXPRESSION: verbal  VERBAL EXPRESSION: Level of generative/spontaneous verbalization: phrase Automatic speech: name: intact and counting: intact  Repetition: Impaired: Word Naming: Confrontation: 0-25%, Convergent: 0-25%, and Divergent: 0-25% Pragmatics: Impaired: topic appropriateness and turn taking Comments:  Interfering components: attention Effective technique: semantic cues and sentence completion Non-verbal means of communication: N/A  WRITTEN EXPRESSION: Dominant hand: right Written expression: Impaired: word  MOTOR SPEECH: Overall motor speech: impaired Level of impairment: Sentence Respiration: thoracic breathing Phonation: normal Resonance: WFL Articulation: Appears intact Intelligibility: Intelligible Motor planning: Impaired: unaware Motor speech errors: unaware Interfering components:    Effective technique:  n/a  ORAL MOTOR EXAMINATION: Overall status: WFL Comments:   STANDARDIZED ASSESSMENTS: QAB: Moderate -QAB form 3 -  5.17 low moderate, decline since prior QAB was 5.39  PATIENT REPORTED OUTCOME MEASURES (PROM): Communication Participation Item Bank: filled out by sposue, all items a 1 'quite a bit" of difficulty except for participating in fast communication or persuading others were both rate a 0 - "very much" difficulty   TODAY'S TREATMENT:                                                                                                                                         DATE:   05/04/22 (eval day): provided an AD companion cards so [name] can discretely let others know that he has dementia and may be inappropriate or confused. Generated strategies for possible visual aids to encourage urinating in the toilet. Provided 2 resources for potential day programs for memory care/respite. Provided form for personally relevant words to be completed  before next session by spouse. Requested photos of family/friends pt interacts with regularly   PATIENT EDUCATION: Education details: see today's treatment, patient instructions Person educated: Patient and Spouse Education method: Verbal cues and Handouts Education comprehension: verbalized understanding and needs further education   GOALS: Goals reviewed with patient? Yes with spouse  SHORT TERM GOALS: Target date: 06/01/22  Pt will use low tech AAC/external aids to communicate wants/needs and family members with min A from spouse Baseline: Goal status: INITIAL  2.  Pt will use visual aids to reduce voiding in inappropriate places by 25% subjectively Baseline:  Goal status: INITIAL  3.  Caregiver will use visual aids/symbols to re-direct pt when he demonstrates inappropriate behavior/interactions Baseline:  Goal status: INITIAL   LONG TERM GOALS: Target date: 06/29/22  Pt/caregiver will use low tech AAC to communicate simple directions, wants/needs Baseline:  Goal status: INITIAL  2.  Pt/caregiver will use external/visual aids to limit excessive cash withdrawls from ATM Baseline:  Goal status: INITIAL  3.  Caregiver will verbalize 3 resources for caregivers for dementia patients Baseline:  Goal status: INITIAL ASSESSMENT:  CLINICAL IMPRESSION: Patient is a 67 y.o. male who was seen today for severe cognitive impairment and moderate to severe aphasia/PPA. Spouse reports difficulty communicating with her husband. He has been voiding in trash cans in the house, outside the front door and at a gas pump while getting gas. She is unable to redirect him during these episodes and is worried he will be arrested for indecent exposure. He is making  frequent excessive withdraws from ATMs and hiding money. She worries he will give a large amount away or be scammed. She does not have control of his account. Davey required cues to name his spouse and  2 daughters [name] and [place]).  He was unable to name his dog or breed ([name], dachshund) and the grocery store they frequent several times a week [name]) despite [employer]. He confused mama with wife and stated I was his daughter. Random, inappropriate utterances "I'm glad I'm not gay anymore" without awareness. I suspect Xzavier was unable to read pre-morbidly, therefore written aids/cues may not be effective. At this time, I recommend short/trial course of ST  to train caregiver in strategies to improve communication and support pt's cognition to  reduce caregiver burden and for patient's safety.  Residential memory care may be required in near future for pt's safety.  OBJECTIVE IMPAIRMENTS: include attention, memory, awareness, executive functioning, and aphasia. These impairments are limiting patient from managing medications, managing appointments, managing finances, household responsibilities, ADLs/IADLs, and effectively communicating at home and in community. Factors affecting potential to achieve goals and functional outcome are ability to learn/carryover information and cooperation/participation level. Patient will benefit from skilled SLP services to address above impairments and improve overall function.  REHAB POTENTIAL: Fair due to progressive dementia/PPA  PLAN:  SLP FREQUENCY: 2x/week  SLP DURATION: 8 weeks  PLANNED INTERVENTIONS: Language facilitation, Environmental controls, Cueing hierachy, Cognitive reorganization, Internal/external aids, Functional tasks, Multimodal communication approach, SLP instruction and feedback, Compensatory strategies, and Patient/family education    Iline Oven, [place] 05/04/2022, 4:52 PM

## 2022-05-04 NOTE — Patient Instructions (Signed)
   [facility] Day program -   Re-check [facility] and see what they offer for dementia - medicare may cover  Make list on personally relevant words

## 2022-05-08 ENCOUNTER — Ambulatory Visit
Admission: RE | Admit: 2022-05-08 | Discharge: 2022-05-08 | Disposition: A | Discharge status: Home / Self Care | Payer: Self-pay | Source: Ambulatory Visit | Attending: Podiatry | Admitting: Podiatry

## 2022-05-08 DIAGNOSIS — M7751 Other enthesopathy of right foot: Secondary | ICD-10-CM

## 2022-05-11 ENCOUNTER — Ambulatory Visit: Payer: Medicare PPO | Admitting: Speech Pathology

## 2022-05-11 ENCOUNTER — Ambulatory Visit: Payer: Medicare PPO | Admitting: Physical Therapy

## 2022-05-11 DIAGNOSIS — R4701 Aphasia: Secondary | ICD-10-CM

## 2022-05-11 DIAGNOSIS — R2681 Unsteadiness on feet: Secondary | ICD-10-CM

## 2022-05-11 DIAGNOSIS — R41841 Cognitive communication deficit: Secondary | ICD-10-CM

## 2022-05-11 DIAGNOSIS — R2689 Other abnormalities of gait and mobility: Secondary | ICD-10-CM

## 2022-05-11 DIAGNOSIS — M6281 Muscle weakness (generalized): Secondary | ICD-10-CM

## 2022-05-11 NOTE — Therapy (Signed)
OUTPATIENT SPEECH LANGUAGE PATHOLOGY APHASIA EVALUATION   Patient Name: [name] MRN: [id number] DOB:[birth year], 67 y.o., male Today's Date: 05/11/2022  PCP: Riley Leyden, MD REFERRING PROVIDER: Collene Leyden, MD  END OF SESSION:  End of Session - 05/11/22 1310     Visit Number 2    Number of Visits 17    Date for SLP Re-Evaluation 06/29/22    SLP Start Time [id number]    SLP Stop Time  1352    SLP Time Calculation (min) 45 min    Activity Tolerance Patient tolerated treatment well             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   Semantic vs logopenic   Pure hypercholesterolemia    Renal cyst    Shoulder joint pain, right    Wears glasses    Past Surgical History:  Procedure Laterality Date   TONSILLECTOMY  age 69   TRANSURETHRAL RESECTION OF PROSTATE N/A 02/05/2018   Procedure: TRANSURETHRAL RESECTION OF THE PROSTATE (TURP);  Surgeon: Cleon Gustin, MD;  Location: [facility];  Service: Urology;  Laterality: N/A;   Patient Active Problem List   Diagnosis Date Noted   PAD (peripheral artery disease) ([place]) 02/08/2022   Increased frequency of urination 03/24/2021   Major neurocognitive disorder 03/24/2021   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   Fatty liver    Hardening of the aorta (main artery of the heart)    Insomnia    Pain in right hip    Personality disorder    Pure hypercholesterolemia    Renal cyst    Shoulder joint pain, right    Male erectile dysfunction 12/21/2019   Benign prostatic hyperplasia without lower urinary tract symptoms 02/05/2018    ONSET DATE:  04/28/2022   REFERRING DIAG: F01.50 (ICD-10-CM) - Vascular dementia, unspecified severity, without behavioral disturbance, psychotic disturbance, mood disturbance, and anxiety  THERAPY DIAG:  Aphasia  Cognitive communication deficit  Rationale for Evaluation and Treatment: Habilitation  SUBJECTIVE:   SUBJECTIVE STATEMENT: "We're taking it day by day" Pt accompanied by: significant other [name]  PERTINENT HISTORY: major neurocognitive disorder, PPA, HAV. Neurospych eval Feb 2023, know to [place] from brief course of therapy (e visits) for cognition and aphasia. Pt did not return to therapy after the 3rd visit.  PAIN:  Are you having pain? Yes, unable to rate due to aphasia/cognition. Bilateral LE  FALLS: Has patient fallen in last 6 months?  Yes, See PT evaluation for details  PATIENT GOALS: "To communicate better"  OBJECTIVE:   TODAY'S TREATMENT:  05/11/22: Pt's wife with c/o pt urinating in inappropriate locations. SLP provided visual aid to support pt and reviewed it with pt. Educated on spouse on importance of self-care and being selective about when to correct the pt's memory errors to reduce stress for both individuals.  SLP discussed benefits of using photos on phone as external memory aid with text to speech. Explained that pt will need frequent repetition to learn how to use external memory aid. Educated pt's spouse on supporting pt's communication by verbal prompting and then providing choices if pt is not responsive to initial prompts. Importance of pt using his language to communicate in order to retain his language abilities.   05/04/22 (eval day): provided an AD companion cards so [name] can discretely let others know that he has dementia and may be inappropriate or confused. Generated strategies for possible visual aids to encourage urinating  in the toilet. Provided 2 resources for potential day programs for memory care/respite. Provided form for personally relevant words to be completed before next session by spouse. Requested photos of family/friends pt interacts with regularly   PATIENT EDUCATION: Education details: see today's treatment, patient instructions Person educated: Patient and Spouse Education method: Verbal cues and Handouts Education comprehension: verbalized understanding and needs further education   GOALS: Goals reviewed with patient? Yes with spouse  SHORT TERM GOALS: Target date: 06/01/22  Pt will use low tech AAC/external aids to communicate wants/needs and family members with min A from spouse Baseline: Goal status: IN PROGRESS  2.  Pt will use visual aids to reduce voiding in inappropriate places by 25% subjectively Baseline:  Goal status: IN PROGRESS  3.  Caregiver will use visual aids/symbols to re-direct pt when he demonstrates inappropriate behavior/interactions Baseline:  Goal status: IN PROGRESS   LONG TERM GOALS: Target date: 06/29/22  Pt/caregiver will use low tech AAC to communicate simple directions, wants/needs Baseline:  Goal status: IN PROGRESS  2.  Pt/caregiver will use external/visual aids to limit excessive cash withdrawls from ATM Baseline:  Goal status: IN PROGRESS  3.  Caregiver will verbalize 3 resources for caregivers for dementia patients Baseline:  Goal status: IN PROGRESS ASSESSMENT:  CLINICAL IMPRESSION: Patient is a 67 y.o. male who was seen today for severe cognitive impairment and moderate to severe aphasia/PPA. Spouse reports difficulty communicating with her husband. He has been voiding in trash cans in the house, outside the front door and at a gas pump while getting gas. She is unable to redirect him during these episodes and is worried he will be arrested for indecent exposure. He is making  frequent excessive withdraws from ATMs and hiding money. She  worries he will give a large amount away or be scammed. She does not have control of his account. [name] required cues to name his spouse and  2 daughters [name] and [name]). He was unable to name his dog or breed ([name], dachshund) and the grocery store they frequent several times a week [name]) despite [name]. He confused mama with wife and stated I was his daughter. Random, inappropriate utterances "I'm glad I'm not gay anymore" without awareness. I suspect Gib was unable to read pre-morbidly, therefore written aids/cues may not be effective. At this time, I recommend short/trial course of ST  to train caregiver in strategies to improve communication and support pt's cognition to  reduce caregiver burden and for patient's safety.  Residential memory care may be required in near future for pt's safety.  OBJECTIVE IMPAIRMENTS: include attention, memory,  awareness, executive functioning, and aphasia. These impairments are limiting patient from managing medications, managing appointments, managing finances, household responsibilities, ADLs/IADLs, and effectively communicating at home and in community. Factors affecting potential to achieve goals and functional outcome are ability to learn/carryover information and cooperation/participation level. Patient will benefit from skilled SLP services to address above impairments and improve overall function.  REHAB POTENTIAL: Fair due to progressive dementia/PPA  PLAN:  SLP FREQUENCY: 2x/week  SLP DURATION: 8 weeks  PLANNED INTERVENTIONS: Language facilitation, Environmental controls, Cueing hierachy, Cognitive reorganization, Internal/external aids, Functional tasks, Multimodal communication approach, SLP instruction and feedback, Compensatory strategies, and Patient/family education    Leroy Libman, Student-SLP 05/11/2022, 1:17 PM

## 2022-05-11 NOTE — Therapy (Signed)
OUTPATIENT PHYSICAL THERAPY LOWER EXTREMITY TREATMENT   Patient Name: [name] MRN: [id number] DOB:September 24, 1955, 67 y.o., male Today's Date: 05/11/2022  END OF SESSION:  PT End of Session - 05/11/22 1359     Visit Number 2    Number of Visits 9   with eval   Date for PT Re-Evaluation 06/15/22   to allow for scheduling delays   Authorization Type [employer]    PT Start Time [id number]    PT Stop Time 1448    PT Time Calculation (min) 50 min    Equipment Utilized During Treatment Gait belt    Activity Tolerance Patient tolerated treatment well    Behavior During Therapy WFL for tasks assessed/performed;Impulsive              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   Semantic vs logopenic   Pure hypercholesterolemia    Renal cyst    Shoulder joint pain, right    Wears glasses    Past Surgical History:  Procedure Laterality Date   TONSILLECTOMY  age 37   TRANSURETHRAL RESECTION OF PROSTATE N/A 02/05/2018   Procedure: TRANSURETHRAL RESECTION OF THE PROSTATE (TURP);  Surgeon: Cleon Gustin, MD;  Location: [facility];  Service: Urology;  Laterality: N/A;   Patient Active Problem List   Diagnosis Date Noted   PAD (peripheral artery disease) ([place]) 02/08/2022   Increased frequency of urination 03/24/2021   Major neurocognitive disorder 03/24/2021   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   Fatty liver    Hardening of the aorta (main artery of the heart)    Insomnia    Pain in right hip    Personality disorder    Pure hypercholesterolemia    Renal cyst    Shoulder joint pain, right     Male erectile dysfunction 12/21/2019   Benign prostatic hyperplasia without lower urinary tract symptoms 02/05/2018    PCP: Collene Leyden, MD  REFERRING PROVIDER: Collene Leyden, MD  REFERRING DIAG: R29.898 (ICD-10-CM) - Right leg weakness  THERAPY DIAG:  Other abnormalities of gait and mobility  Muscle weakness (generalized)  Unsteadiness on feet  Rationale for Evaluation and Treatment: Rehabilitation  ONSET DATE: 04/29/2022  SUBJECTIVE:   SUBJECTIVE STATEMENT: Pt reports no falls since last visit but his wife states he is frequently off balance. Pt reports that his big toe is "achy". Pt wife reports that pt uncomfortable using his RLE on curbs, etc and tends to furniture walk at home, refuses to use his RW or SPC.  PERTINENT HISTORY: Major neurocognitive disorder, Primary progressive aphasia; HAV  PAIN:  Are you having pain? No  PRECAUTIONS: Fall  WEIGHT BEARING RESTRICTIONS: No  FALLS:  Has patient fallen in last 6 months? Yes. Number of falls too many to count "falls and slips", wife is catching him a lot, needs assist to get back up from the floor  LIVING ENVIRONMENT: Lives with: lives with their spouse Lives in: House/apartment Stairs: Yes: External: 6 steps; on right going up Has following equipment at home: Gaffer - 2 wheeled (pt  refuses to use)  OCCUPATION: retired; enjoyed playing basketball  PLOF: Independent with gait and Independent with transfers  PATIENT GOALS: "improve balance, get R leg stronger"  NEXT MD VISIT: nothing scheduled  OBJECTIVE:   DIAGNOSTIC FINDINGS: pending R ankle and R foot MRI  PATIENT SURVEYS:  LEFS 28/80, 35% of maximal function   COGNITION: Overall cognitive status: History of cognitive impairments - at baseline (vasular dementia, has gotten worse over the past year)   SENSATION: Unable to accurately assess due to cognitive impairments Unable to determine  EDEMA:  None present at eval,  wife reports that R knee and R ankle swell (knee>ankle)  POSTURE: rounded shoulders and forward head; keep RLE in outstretched position in sitting  LOWER EXTREMITY ROM: WFL  LOWER EXTREMITY MMT:  MMT Right eval Left eval  Hip flexion 2- 5  Hip extension    Hip abduction    Hip adduction    Hip internal rotation    Hip external rotation    Knee flexion 2- 5  Knee extension 2- 5  Ankle dorsiflexion 2- 5  Ankle plantarflexion    Ankle inversion    Ankle eversion     (Blank rows = not tested)   GAIT: Distance walked: various clinic distances Assistive device utilized: None Level of assistance: CGA Comments: antalgic, decreased R hip and knee flexion, decreased R ankle DF, circumduction of RLE, L lateral trunk shift   TODAY'S TREATMENT:                                                                                                                              TherAct At countertop with one UE support: 4" forward step taps x 10 reps with RLE 6" forward step taps x 10 reps with RLE 6" lateral step taps x 10 reps with RLE Sidesteps L/R with GTB 3 x 10 ft each direction with BUE support (difficult), with 3 x 10 ft with RTB (moderate) Pt with difficulty performing hip abduction instead of compensation and using hip flexors Resisted sit to stands with RTB (easy) x 10 reps, with GTB x 10 reps (moderate)  Added resisted sit to stands to HEP, see bolded below  Gait Gait pattern: decreased hip/knee flexion- Right and decreased ankle dorsiflexion- Right Distance walked: 115 ft Assistive device utilized: None Level of assistance: SBA Comments: trial gait with R foot up brace, improved LE clearance and decreased compensations such as L lateral trunk lean and R circumduction as compared to gait without use of foot up brace  Provided handout for where pt's wife can purchase foot-up brace, though she states the pt will likely not wear the brace.  PATIENT EDUCATION:  Education  details: foot-up brace, initiated HEP Person educated: Patient and Spouse Education method: Explanation, Demonstration, Tactile cues, Verbal cues, and Handouts Education comprehension: returned demonstration, verbal cues required, tactile cues required, and needs further education  HOME EXERCISE PROGRAM: Access Code: [email or web address] URL: [email or web address] Date: 05/11/2022 Prepared  by: Excell Seltzer  Exercises - Sit to Stand with Resistance Around Legs  - 1 x daily - 7 x weekly - 3 sets - 10 reps  ASSESSMENT:  CLINICAL IMPRESSION: Emphasis of skilled PT session on assessing gait with foot up brace and initiating HEP for LE strengthening. Pt exhibits improved RLE clearance with use of foot-up brace, does continue to exhibit decreased hip and knee flexion during gait due to muscle weakness. However, per pt and per his wife he would likely be reluctant to wearing this brace. Encouraged pt to consider wearing it as he has DF weakness and this would improve his limb clearance and decrease his fall risk. Pt with some difficulty attending to task during therapy session and becoming distracted and/or perseverative, needs some redirection to focus on therapy tasks. Pt continues to benefit from skilled therapy services to address ongoing RLE weakness leading to impaired balance. Continue POC.    OBJECTIVE IMPAIRMENTS: Abnormal gait, decreased balance, decreased cognition, decreased coordination, decreased knowledge of condition, decreased knowledge of use of DME, decreased mobility, difficulty walking, decreased strength, decreased safety awareness, postural dysfunction, and pain.   ACTIVITY LIMITATIONS: carrying, lifting, bending, standing, squatting, stairs, transfers, and locomotion level  PARTICIPATION LIMITATIONS: driving, shopping, community activity, and yard work  PERSONAL FACTORS: Time since onset of injury/illness/exacerbation and 1-2 comorbidities:    Major neurocognitive  disorder, Primary progressive aphasia; HAVare also affecting patient's functional outcome.   REHAB POTENTIAL: Fair time since onset, has tried PT before for this problem, cognitive impairments  CLINICAL DECISION MAKING: Unstable/unpredictable  EVALUATION COMPLEXITY: Moderate   GOALS: Goals reviewed with patient? Yes  SHORT TERM GOALS=LONG TERM GOALS due to length of POC  LONG TERM GOALS: Target date: 06/08/2022  Pt will be independent with final HEP for improved strength, balance, transfers and gait. Baseline:  Goal status: INITIAL  2.  Pt will improve gait velocity to at least 2.75 ft/sec for improved gait efficiency and performance at Supervision level  Baseline: 2.54 ft/sec no AD CGA (3/13) Goal status: INITIAL  3.  Pt will improve Berg score to 28/56 for decreased fall risk Baseline: 22/56 (3/13) Goal status: INITIAL  4.  Pt will improve LEFS score to 40% of maximum function (32/80) to demonstrate decreased disability level Baseline: 35% or 28/80 (3/13) Goal status: INITIAL    PLAN:  PT FREQUENCY: 2x/week  PT DURATION: 4 weeks  PLANNED INTERVENTIONS: Therapeutic exercises, Therapeutic activity, Neuromuscular re-education, Balance training, Gait training, Patient/Family education, Self Care, Joint mobilization, Stair training, Orthotic/Fit training, DME instructions, Taping, Manual therapy, and Re-evaluation  PLAN FOR NEXT SESSION: foot up brace? Add to HEP for RLE strengthening and for balance (squats, step-taps, marches, hip 3 ways), SciFit vs NuStep, ankle/foot MRI?   Excell Seltzer, PT, DPT, CSRS 05/11/2022, 2:48 PM

## 2022-05-13 ENCOUNTER — Ambulatory Visit: Payer: Medicare PPO | Admitting: Speech Pathology

## 2022-05-13 ENCOUNTER — Ambulatory Visit: Payer: Medicare PPO | Admitting: Physical Therapy

## 2022-05-13 DIAGNOSIS — R2689 Other abnormalities of gait and mobility: Secondary | ICD-10-CM

## 2022-05-13 DIAGNOSIS — R41841 Cognitive communication deficit: Secondary | ICD-10-CM

## 2022-05-13 DIAGNOSIS — M6281 Muscle weakness (generalized): Secondary | ICD-10-CM

## 2022-05-13 DIAGNOSIS — R4701 Aphasia: Secondary | ICD-10-CM

## 2022-05-13 DIAGNOSIS — R2681 Unsteadiness on feet: Secondary | ICD-10-CM

## 2022-05-13 NOTE — Patient Instructions (Addendum)
Activities at home:  Listening to music (music videos on smart TV, concerts)  [id number] piece puzzles that are colorful    He can sort pieces  [place] (small sets where he can follow directions)   *Consider how you can break activities down to [place] can participate in part of the activity   Print off pictures of people who he interacts with and bring them next time. We will create a memory book.

## 2022-05-13 NOTE — Therapy (Signed)
OUTPATIENT PHYSICAL THERAPY LOWER EXTREMITY TREATMENT   Patient Name: [name] MRN: [id number] DOB:1955-09-09, 67 y.o., male Today's Date: 05/13/2022  END OF SESSION:  PT End of Session - 05/13/22 0933     Visit Number 3    Number of Visits 9   with eval   Date for PT Re-Evaluation 06/15/22   to allow for scheduling delays   Authorization Type [employer]    PT Start Time 0930    PT Stop Time 1015    PT Time Calculation (min) 45 min    Equipment Utilized During Treatment Gait belt    Activity Tolerance Patient tolerated treatment well    Behavior During Therapy WFL for tasks assessed/performed;Impulsive               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   Semantic vs logopenic   Pure hypercholesterolemia    Renal cyst    Shoulder joint pain, right    Wears glasses    Past Surgical History:  Procedure Laterality Date   TONSILLECTOMY  age 31   TRANSURETHRAL RESECTION OF PROSTATE N/A 02/05/2018   Procedure: TRANSURETHRAL RESECTION OF THE PROSTATE (TURP);  Surgeon: Cleon Gustin, MD;  Location: [facility];  Service: Urology;  Laterality: N/A;   Patient Active Problem List   Diagnosis Date Noted   PAD (peripheral artery disease) ([place]) 02/08/2022   Increased frequency of urination 03/24/2021   Major neurocognitive disorder 03/24/2021   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   Fatty liver    Hardening of the aorta (main artery of the heart)    Insomnia    Pain in right hip    Personality disorder    Pure hypercholesterolemia    Renal cyst    Shoulder joint pain, right     Male erectile dysfunction 12/21/2019   Benign prostatic hyperplasia without lower urinary tract symptoms 02/05/2018    PCP: Collene Leyden, MD  REFERRING PROVIDER: Collene Leyden, MD  REFERRING DIAG: R29.898 (ICD-10-CM) - Right leg weakness  THERAPY DIAG:  Other abnormalities of gait and mobility  Muscle weakness (generalized)  Unsteadiness on feet  Rationale for Evaluation and Treatment: Rehabilitation  ONSET DATE: 04/29/2022  SUBJECTIVE:   SUBJECTIVE STATEMENT: Pt and family report no falls since last visit. Pt states he has no pain today but does indicate that his shoes are uncomfortable.  PERTINENT HISTORY: Major neurocognitive disorder, Primary progressive aphasia; HAV  PAIN:  Are you having pain? No  PRECAUTIONS: Fall  WEIGHT BEARING RESTRICTIONS: No  FALLS:  Has patient fallen in last 6 months? Yes. Number of falls too many to count "falls and slips", wife is catching him a lot, needs assist to get back up from the floor  LIVING ENVIRONMENT: Lives with: lives with their spouse Lives in: House/apartment Stairs: Yes: External: 6 steps; on right going up Has following equipment at home: Gaffer - 2 wheeled (pt refuses to use)  OCCUPATION: retired; enjoyed playing basketball  PLOF: Independent with gait and Independent with transfers  PATIENT GOALS: "improve balance, get R  leg stronger"  NEXT MD VISIT: nothing scheduled  OBJECTIVE:   DIAGNOSTIC FINDINGS: R ankle MRI 05/08/22 IMPRESSION: 1. No acute osseous abnormality or internal derangement of the right ankle. 2. Mild degenerative changes of the tibiotalar and talonavicular joints.  R foot MRI 05/08/22 IMPRESSION: 1. Subacute fracture of the fifth metatarsal neck without significant displacement. 2. Hallux valgus deformity with moderate osteoarthritis of the first MTP joint.  PATIENT SURVEYS:  LEFS 28/80, 35% of maximal function   COGNITION: Overall cognitive status:  History of cognitive impairments - at baseline (vasular dementia, has gotten worse over the past year)   SENSATION: Unable to accurately assess due to cognitive impairments Unable to determine  EDEMA:  None present at eval, wife reports that R knee and R ankle swell (knee>ankle)  POSTURE: rounded shoulders and forward head; keep RLE in outstretched position in sitting  LOWER EXTREMITY ROM: WFL  LOWER EXTREMITY MMT:  MMT Right eval Left eval  Hip flexion 2- 5  Hip extension    Hip abduction    Hip adduction    Hip internal rotation    Hip external rotation    Knee flexion 2- 5  Knee extension 2- 5  Ankle dorsiflexion 2- 5  Ankle plantarflexion    Ankle inversion    Ankle eversion     (Blank rows = not tested)    TODAY'S TREATMENT:                                                                                                                              TherAct At bottom of stairs with BUE support: 6" step-ups with RLE x 10 reps Resisted 6" step-ups with RLE x 10 reps with orange band, difficult for pt to follow cues due to cognitive impairments  In // bars with BUE support: Foam beam step-overs with RLE with focus on increasing step length and step clearance with gait in // bars as well as with static balance in // bars  TherEx Supine bridges 3 x 10 reps  Added to HEP, see bolded below  SciFit multi-peaks level 2 for 8 minutes using BUE/BLEs for neural priming for reciprocal movement, dynamic cardiovascular warmup and increased amplitude of stepping. RPE of 4/10 following activity    Gait Gait pattern: decreased hip/knee flexion- Right, decreased ankle dorsiflexion- Right, circumduction- Right, and poor foot clearance- Right Distance walked: 115 ft Assistive device utilized: None Level of assistance: CGA Comments: in therapy gym following blocked practice of increasing RLE clearance; pt does exhibit improved hip flexion during gait, ongoing circumduction of  limb, but not dragging RLE along the floor   PATIENT EDUCATION:  Education details: added to HEP Person educated: Patient and Spouse Education method: Explanation, Media planner, Corporate treasurer cues, Verbal cues, and Handouts Education comprehension: returned demonstration, verbal cues required, tactile cues required, and needs further education  HOME EXERCISE PROGRAM: Access Code: [facility] URL: [email or web address] Date: 05/11/2022 Prepared by: Excell Seltzer  Exercises - Sit to  Stand with Resistance Around Legs  - 1 x daily - 7 x weekly - 3 sets - 10 reps - Supine Bridge  - 1 x daily - 7 x weekly - 3 sets - 10 reps  ASSESSMENT:  CLINICAL IMPRESSION: Emphasis of skilled PT session on continuing to work on RLE strengthening and improving RLE step length and limb clearance during gait. Pt continues to exhibit decreased R hip and knee flexor strength as well as decreased DF strength leading to gait impairment as noted above. Pt does exhibit improved RLE clearance following blocked practice but also compensates with circumduction. Pt also remains limited by his cognitive impairments and needs redirection to focus on therapy tasks. Continue POC.   OBJECTIVE IMPAIRMENTS: Abnormal gait, decreased balance, decreased cognition, decreased coordination, decreased knowledge of condition, decreased knowledge of use of DME, decreased mobility, difficulty walking, decreased strength, decreased safety awareness, postural dysfunction, and pain.   ACTIVITY LIMITATIONS: carrying, lifting, bending, standing, squatting, stairs, transfers, and locomotion level  PARTICIPATION LIMITATIONS: driving, shopping, community activity, and yard work  PERSONAL FACTORS: Time since onset of injury/illness/exacerbation and 1-2 comorbidities:    Major neurocognitive disorder, Primary progressive aphasia; HAVare also affecting patient's functional outcome.   REHAB POTENTIAL: Fair time since onset, has tried PT  before for this problem, cognitive impairments  CLINICAL DECISION MAKING: Unstable/unpredictable  EVALUATION COMPLEXITY: Moderate   GOALS: Goals reviewed with patient? Yes  SHORT TERM GOALS=LONG TERM GOALS due to length of POC  LONG TERM GOALS: Target date: 06/08/2022  Pt will be independent with final HEP for improved strength, balance, transfers and gait. Baseline:  Goal status: INITIAL  2.  Pt will improve gait velocity to at least 2.75 ft/sec for improved gait efficiency and performance at Supervision level  Baseline: 2.54 ft/sec no AD CGA (3/13) Goal status: INITIAL  3.  Pt will improve Berg score to 28/56 for decreased fall risk Baseline: 22/56 (3/13) Goal status: INITIAL  4.  Pt will improve LEFS score to 40% of maximum function (32/80) to demonstrate decreased disability level Baseline: 35% or 28/80 (3/13) Goal status: INITIAL    PLAN:  PT FREQUENCY: 2x/week  PT DURATION: 4 weeks  PLANNED INTERVENTIONS: Therapeutic exercises, Therapeutic activity, Neuromuscular re-education, Balance training, Gait training, Patient/Family education, Self Care, Joint mobilization, Stair training, Orthotic/Fit training, DME instructions, Taping, Manual therapy, and Re-evaluation  PLAN FOR NEXT SESSION: foot up brace? Add to HEP for RLE strengthening and for balance (squats, step-taps, marches, hip 3 ways), SciFit vs NuStep, practice with RW and/or cane (4 prongs) next session, work on curbs   [employer], PT, DPT, CSRS 05/13/2022, 10:18 AM

## 2022-05-13 NOTE — Therapy (Signed)
OUTPATIENT SPEECH LANGUAGE PATHOLOGY APHASIA EVALUATION   Patient Name: [name] MRN: [id number] DOB:10-24-1955, 67 y.o., male Today's Date: 05/13/2022  PCP: Collene Leyden, MD REFERRING PROVIDER: Collene Leyden, MD  END OF SESSION:  End of Session - 05/13/22 1022     Visit Number 3    Number of Visits 17    Date for SLP Re-Evaluation 06/29/22    Authorization Type Medicare    SLP Start Time 1015    SLP Stop Time  1100    SLP Time Calculation (min) 45 min    Activity Tolerance Patient tolerated treatment well             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   Semantic vs logopenic   Pure hypercholesterolemia    Renal cyst    Shoulder joint pain, right    Wears glasses    Past Surgical History:  Procedure Laterality Date   TONSILLECTOMY  age 36   TRANSURETHRAL RESECTION OF PROSTATE N/A 02/05/2018   Procedure: TRANSURETHRAL RESECTION OF THE PROSTATE (TURP);  Surgeon: Cleon Gustin, MD;  Location: [facility];  Service: Urology;  Laterality: N/A;   Patient Active Problem List   Diagnosis Date Noted   PAD (peripheral artery disease) ([place]) 02/08/2022   Increased frequency of urination 03/24/2021   Major neurocognitive disorder 03/24/2021   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   Fatty liver    Hardening of the aorta (main artery of the heart)    Insomnia    Pain in right hip    Personality disorder    Pure hypercholesterolemia    Renal cyst    Shoulder joint pain, right    Male erectile dysfunction 12/21/2019   Benign prostatic hyperplasia without lower urinary tract  symptoms 02/05/2018    ONSET DATE: 04/28/2022   REFERRING DIAG: F01.50 (ICD-10-CM) - Vascular dementia, unspecified severity, without behavioral disturbance, psychotic disturbance, mood disturbance, and anxiety  THERAPY DIAG:  Aphasia  Cognitive communication deficit  Rationale for Evaluation and Treatment: Habilitation  SUBJECTIVE:   SUBJECTIVE STATEMENT: "You know what I want? Go to [employer]" Pt accompanied by: significant other [name]  PERTINENT HISTORY: major neurocognitive disorder, PPA, HAV. Neurospych eval Feb 2023, know to [place] from brief course of therapy (e visits) for cognition and aphasia. Pt did not return to therapy after the 3rd visit.  PAIN:  Are you having pain? Yes, unable to rate due to aphasia/cognition. Bilateral LE  FALLS: Has patient fallen in last 6 months?  Yes, See PT evaluation for details  PATIENT GOALS: "To communicate better"  OBJECTIVE:   TODAY'S TREATMENT:  05/13/22: Pt accompanied by spouse. She reported that pt has difficulty eating in restaurants and becomes upset when they do not serve the food he wants. SLP educated family on bringing the pt's preferred food to restaurants so that family can still enjoy places they enjoy. Discussed benefits of routine for individuals with dementia.  Pt experiencing difficulty with change in routine and staying at home all day. SLP facilitated conversation concerning options that engage pt and support his confidence. Consider how to break down tasks to allow him to participate, such as smaller puzzles that are colorful so he can sort pieces.  HEP: Bring photos of people with whom pt interacts to begin creating a memory book.   05/11/22: Pt's wife with c/o pt urinating in inappropriate locations. SLP provided visual aid to support pt and reviewed it with pt. Educated on spouse on  importance of self-care and being selective about when to correct the pt's memory errors to reduce stress for both individuals.  SLP discussed benefits of using photos on phone as external memory aid with text to speech. Explained that pt will need frequent repetition to learn how to use external memory aid. Educated pt's spouse on supporting pt's communication by verbal prompting and then providing choices if pt is not responsive to initial prompts. Importance of pt using his language to communicate in order to retain his language abilities.   05/04/22 (eval day): provided an AD companion cards so [name] can discretely let others know that he has dementia and may be inappropriate or confused. Generated strategies for possible visual aids to encourage urinating in the toilet. Provided 2 resources for potential day programs for memory care/respite. Provided form for personally relevant words to be completed before next session by spouse. Requested photos of family/friends pt interacts with regularly   PATIENT EDUCATION: Education details: see today's treatment, patient instructions Person educated: Patient and Spouse Education method: Verbal cues and Handouts Education comprehension: verbalized understanding and needs further education   GOALS: Goals reviewed with patient? Yes with spouse  SHORT TERM GOALS: Target date: 06/01/22  Pt will use low tech AAC/external aids to communicate wants/needs and family members with min A from spouse Baseline: Goal status: INITIAL  2.  Pt will use visual aids to reduce voiding in inappropriate places by 25% subjectively Baseline:  Goal status: INITIAL  3.  Caregiver will use visual aids/symbols to re-direct pt when he demonstrates inappropriate behavior/interactions Baseline:  Goal status: INITIAL   LONG TERM GOALS: Target date: 06/29/22  Pt/caregiver will use low tech AAC to communicate simple directions, wants/needs Baseline:  Goal status:  INITIAL  2.  Pt/caregiver will use external/visual aids to limit excessive cash withdrawls from ATM Baseline:  Goal status: INITIAL  3.  Caregiver will verbalize 3 resources for caregivers for dementia patients Baseline:  Goal status: INITIAL ASSESSMENT:  CLINICAL IMPRESSION: Patient is a 67 y.o. male who was seen today for severe cognitive impairment and moderate to severe aphasia/PPA. Spouse reports difficulty communicating with her husband. He has been voiding in trash cans in the house, outside the front door and at a gas pump while getting gas. She is unable to redirect him during these episodes and is worried he will be arrested for indecent exposure. He is making  frequent excessive withdraws from ATMs and hiding money. She worries he will give a large amount away or be scammed. She does not have control of his account. Amous required cues to name his spouse and  2 daughters [name]  and Regina). He was unable to name his dog or breed ([name], dachshund) and the grocery store they frequent several times a week [name]) despite [employer]. He confused mama with wife and stated I was his daughter. Random, inappropriate utterances "I'm glad I'm not gay anymore" without awareness. I suspect Jhordan was unable to read pre-morbidly, therefore written aids/cues may not be effective. At this time, I recommend short/trial course of ST  to train caregiver in strategies to improve communication and support pt's cognition to  reduce caregiver burden and for patient's safety.  Residential memory care may be required in near future for pt's safety.  OBJECTIVE IMPAIRMENTS: include attention, memory, awareness, executive functioning, and aphasia. These impairments are limiting patient from managing medications, managing appointments, managing finances, household responsibilities, ADLs/IADLs, and effectively communicating at home and in community. Factors affecting potential to achieve goals and  functional outcome are ability to learn/carryover information and cooperation/participation level. Patient will benefit from skilled SLP services to address above impairments and improve overall function.  REHAB POTENTIAL: Fair due to progressive dementia/PPA  PLAN:  SLP FREQUENCY: 2x/week  SLP DURATION: 8 weeks  PLANNED INTERVENTIONS: Language facilitation, Environmental controls, Cueing hierachy, Cognitive reorganization, Internal/external aids, Functional tasks, Multimodal communication approach, SLP instruction and feedback, Compensatory strategies, and Patient/family education    Leroy Libman, [place] 05/13/2022, 10:23 AM

## 2022-05-18 ENCOUNTER — Ambulatory Visit: Payer: Medicare PPO | Admitting: Speech Pathology

## 2022-05-18 ENCOUNTER — Ambulatory Visit: Payer: Medicare PPO | Admitting: Physical Therapy

## 2022-05-19 ENCOUNTER — Telehealth: Payer: Self-pay | Admitting: *Deleted

## 2022-05-19 NOTE — Telephone Encounter (Signed)
Wife is calling because the bunion is turning black and more painful,has an appointment on 05/26/22, should he be seen sooner?

## 2022-05-23 ENCOUNTER — Ambulatory Visit: Payer: Medicare PPO | Attending: Podiatry | Admitting: Speech Pathology

## 2022-05-23 ENCOUNTER — Ambulatory Visit: Payer: Medicare PPO | Admitting: Physical Therapy

## 2022-05-23 DIAGNOSIS — M6281 Muscle weakness (generalized): Secondary | ICD-10-CM

## 2022-05-23 DIAGNOSIS — R2681 Unsteadiness on feet: Secondary | ICD-10-CM | POA: Diagnosis present

## 2022-05-23 DIAGNOSIS — R41841 Cognitive communication deficit: Secondary | ICD-10-CM | POA: Insufficient documentation

## 2022-05-23 DIAGNOSIS — R2689 Other abnormalities of gait and mobility: Secondary | ICD-10-CM | POA: Diagnosis present

## 2022-05-23 DIAGNOSIS — R4701 Aphasia: Secondary | ICD-10-CM | POA: Insufficient documentation

## 2022-05-23 NOTE — Therapy (Signed)
OUTPATIENT PHYSICAL THERAPY LOWER EXTREMITY TREATMENT   Patient Name: [name] MRN: [id number] DOB:11-11-1955, 67 y.o., male Today's Date: 05/23/2022  END OF SESSION:  PT End of Session - 05/23/22 1153     Visit Number 4    Number of Visits 9   with eval   Date for PT Re-Evaluation 06/15/22   to allow for scheduling delays   Authorization Type [employer]    PT Start Time 1150    PT Stop Time 1230    PT Time Calculation (min) 40 min    Equipment Utilized During Treatment Gait belt    Activity Tolerance Patient tolerated treatment well    Behavior During Therapy WFL for tasks assessed/performed;Impulsive                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   Semantic vs logopenic   Pure hypercholesterolemia    Renal cyst    Shoulder joint pain, right    Wears glasses    Past Surgical History:  Procedure Laterality Date   TONSILLECTOMY  age 63   TRANSURETHRAL RESECTION OF PROSTATE N/A 02/05/2018   Procedure: TRANSURETHRAL RESECTION OF THE PROSTATE (TURP);  Surgeon: Cleon Gustin, MD;  Location: [facility];  Service: Urology;  Laterality: N/A;   Patient Active Problem List   Diagnosis Date Noted   PAD (peripheral artery disease) 02/08/2022   Increased frequency of urination 03/24/2021   Major neurocognitive disorder 03/24/2021   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   Fatty liver    Hardening of the aorta (main artery of the heart)    Insomnia    Pain in right hip    Personality disorder    Pure hypercholesterolemia    Renal cyst    Shoulder joint pain, right     Male erectile dysfunction 12/21/2019   Benign prostatic hyperplasia without lower urinary tract symptoms 02/05/2018    PCP: Collene Leyden, MD  REFERRING PROVIDER: Collene Leyden, MD  REFERRING DIAG: R29.898 (ICD-10-CM) - Right leg weakness  THERAPY DIAG:  Other abnormalities of gait and mobility  Muscle weakness (generalized)  Unsteadiness on feet  Rationale for Evaluation and Treatment: Rehabilitation  ONSET DATE: 04/29/2022  SUBJECTIVE:   SUBJECTIVE STATEMENT: Pt and family report no falls but pt's wife reports that pt taking longer to stand up and holding onto walls and furniture when walking. Pt's wife has encouraged him to use a RW or cane at home but pt refuses. No complaints of pain today. Pt reports he has been doing his HEP, wife confirms.  PERTINENT HISTORY: Major neurocognitive disorder, Primary progressive aphasia; HAV  PAIN:  Are you having pain? No  PRECAUTIONS: Fall  WEIGHT BEARING RESTRICTIONS: No  FALLS:  Has patient fallen in last 6 months? Yes. Number of falls too many to count "falls and slips", wife is catching him a lot, needs assist to get back up from the floor  LIVING ENVIRONMENT: Lives with: lives with their spouse Lives in: House/apartment Stairs: Yes: External: 6 steps; on right going up Has following equipment at home: Quad cane small  base and Walker - 2 wheeled (pt refuses to use)  OCCUPATION: retired; enjoyed playing basketball  PLOF: Independent with gait and Independent with transfers  PATIENT GOALS: "improve balance, get R leg stronger"  NEXT MD VISIT: nothing scheduled  OBJECTIVE:   DIAGNOSTIC FINDINGS: R ankle MRI 05/08/22 IMPRESSION: 1. No acute osseous abnormality or internal derangement of the right ankle. 2. Mild degenerative changes of the tibiotalar and talonavicular joints.  R foot MRI 05/08/22 IMPRESSION: 1. Subacute fracture of the fifth metatarsal neck without significant displacement. 2. Hallux valgus deformity  with moderate osteoarthritis of the first MTP joint.  PATIENT SURVEYS:  LEFS 28/80, 35% of maximal function   COGNITION: Overall cognitive status: History of cognitive impairments - at baseline (vasular dementia, has gotten worse over the past year)   SENSATION: Unable to accurately assess due to cognitive impairments Unable to determine  EDEMA:  None present at eval, wife reports that R knee and R ankle swell (knee>ankle)  POSTURE: rounded shoulders and forward head; keep RLE in outstretched position in sitting  LOWER EXTREMITY ROM: WFL  LOWER EXTREMITY MMT:  MMT Right eval Left eval  Hip flexion 2- 5  Hip extension    Hip abduction    Hip adduction    Hip internal rotation    Hip external rotation    Knee flexion 2- 5  Knee extension 2- 5  Ankle dorsiflexion 2- 5  Ankle plantarflexion    Ankle inversion    Ankle eversion     (Blank rows = not tested)    TODAY'S TREATMENT:                                                                                                                              TherAct Step taps at countertop with LUE support on countertop and RUE support on SBQC: X 10 reps with RLE X 10 reps with LLE  Added to HEP, see bolded below  Gait Gait pattern: decreased hip/knee flexion- Right, decreased ankle dorsiflexion- Right, and trunk flexed Distance walked: 115 ft Assistive device utilized: Environmental consultant - 2 wheeled Level of assistance: CGA Comments: trial gait with RW, pt does tend to push RW out ahead of his body but overall improved balance from gait with no AD in which he is dragging his RLE and reaching out for the wall for balance  Gait pattern: decreased hip/knee flexion- Right, decreased ankle dorsiflexion- Right, and poor foot clearance- Right Distance walked: 115 ft Assistive device utilized: Quad cane small base Level of assistance: Min A Comments: trial gait with SBQC on L side; pt exhibits decreased balance, increased trunk flexion,  shuffling of steps  Gait pattern: decreased hip/knee flexion- Right and decreased ankle dorsiflexion- Right Distance walked: 115 ft Assistive device utilized: Quad cane small base Level of assistance: SBA Comments: trial gait with SBQC on R side, pt actually exhibits improved overall balance and safety with use of device; ongoing RLE impairments but much improved  from use of SBQC on L side   Gait speed with RW, SBQC, and no AD: No device: 2.09 ft/sec RW: 1.69 ft/sec SBQC in LUE: 1.08 ft/sec SBQC in RUE: 1.88 ft/sec  Discussed safest AD use at home and that this therapist would recommend pt use RW or SBQC in R hand. Pt initially states he would not use either device but agreeable by end of session to try using his SBQC more at home. Pt still resistant to idea of using a foot up brace but also with poor recall of trialing this brace in previous sessions.   PATIENT EDUCATION:  Education details: added to HEP, trial of various ADs and recommendation to use RW or SBQC on R side Person educated: Patient and Spouse Education method: Explanation, Demonstration, Tactile cues, Verbal cues, and Handouts Education comprehension: returned demonstration, verbal cues required, tactile cues required, and needs further education  HOME EXERCISE PROGRAM: Access Code: [facility] URL: [email or web address] Date: 05/11/2022 Prepared by: Excell Seltzer  Exercises - Sit to Stand with Resistance Around Legs  - 1 x daily - 7 x weekly - 3 sets - 10 reps - Supine Bridge  - 1 x daily - 7 x weekly - 3 sets - 10 reps - Standing Forward Step Taps with Counter Support  - 1 x daily - 7 x weekly - 3 sets - 10 reps  ASSESSMENT:  CLINICAL IMPRESSION: Emphasis of skilled PT session on trialing gait with various ADs to determine safest device for pt to use at this time. Pt exhibits most improved balance and most independence with use of RW and with SBQC in R hand this session. Pt does continue to exhibit  RLE weakness leading to decreased hip and knee flexion during gait as well as poor limb clearance during gait, leading to increased fall risk. Due to significant cognitive impairments it is difficult to convince pt to trial use of SBQC at home, eventually agreeable by end of session but will wait for next session for wife's report of compliance. Pt remains limited by his cognitive impairments leading to decreased insight into safety and his functional deficits as well as education provided during therapy. Continue POC.   OBJECTIVE IMPAIRMENTS: Abnormal gait, decreased balance, decreased cognition, decreased coordination, decreased knowledge of condition, decreased knowledge of use of DME, decreased mobility, difficulty walking, decreased strength, decreased safety awareness, postural dysfunction, and pain.   ACTIVITY LIMITATIONS: carrying, lifting, bending, standing, squatting, stairs, transfers, and locomotion level  PARTICIPATION LIMITATIONS: driving, shopping, community activity, and yard work  PERSONAL FACTORS: Time since onset of injury/illness/exacerbation and 1-2 comorbidities:    Major neurocognitive disorder, Primary progressive aphasia; HAVare also affecting patient's functional outcome.   REHAB POTENTIAL: Fair time since onset, has tried PT before for this problem, cognitive impairments  CLINICAL DECISION MAKING: Unstable/unpredictable  EVALUATION COMPLEXITY: Moderate   GOALS: Goals reviewed with patient? Yes  SHORT TERM GOALS=LONG TERM GOALS due to length of POC  LONG TERM GOALS: Target date: 06/08/2022  Pt will be independent with final HEP for improved strength, balance, transfers and gait. Baseline:  Goal status: INITIAL  2.  Pt will improve gait velocity to at least 2.75 ft/sec for improved gait efficiency and performance at Supervision level  Baseline: 2.54 ft/sec no AD CGA (3/13) Goal status: INITIAL  3.  Pt will improve Berg score to 28/56 for decreased fall  risk Baseline: 22/56 (3/13) Goal status: INITIAL  4.  Pt will improve LEFS score to 40% of maximum function (32/80) to demonstrate  decreased disability level Baseline: 35% or 28/80 (3/13) Goal status: INITIAL    PLAN:  PT FREQUENCY: 2x/week  PT DURATION: 4 weeks  PLANNED INTERVENTIONS: Therapeutic exercises, Therapeutic activity, Neuromuscular re-education, Balance training, Gait training, Patient/Family education, Self Care, Joint mobilization, Stair training, Orthotic/Fit training, DME instructions, Taping, Manual therapy, and Re-evaluation  PLAN FOR NEXT SESSION: trial foot up brace again to see if pt agreeable to get one? Add to HEP for RLE strengthening and for balance (squats, marches, hip 3 ways), SciFit vs NuStep, practice with RW and/or SBQC again, work on curbs   [employer], PT, DPT, CSRS 05/23/2022, 12:33 PM

## 2022-05-23 NOTE — Therapy (Signed)
OUTPATIENT SPEECH LANGUAGE PATHOLOGY APHASIA EVALUATION   Patient Name: [name] MRN: [id number] DOB:1955/03/02, 67 y.o., male Today's Date: 05/23/2022  PCP: Collene Leyden, MD REFERRING PROVIDER: Collene Leyden, MD  END OF SESSION:  End of Session - 05/23/22 1026     Visit Number 4    Number of Visits 17    Date for SLP Re-Evaluation 06/29/22    Authorization Type Medicare    SLP Start Time 1021   Pt running late   SLP Stop Time  1100    SLP Time Calculation (min) 39 min    Activity Tolerance Patient tolerated treatment well              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   Semantic vs logopenic   Pure hypercholesterolemia    Renal cyst    Shoulder joint pain, right    Wears glasses    Past Surgical History:  Procedure Laterality Date   TONSILLECTOMY  age 68   TRANSURETHRAL RESECTION OF PROSTATE N/A 02/05/2018   Procedure: TRANSURETHRAL RESECTION OF THE PROSTATE (TURP);  Surgeon: Cleon Gustin, MD;  Location: [facility];  Service: Urology;  Laterality: N/A;   Patient Active Problem List   Diagnosis Date Noted   PAD (peripheral artery disease) 02/08/2022   Increased frequency of urination 03/24/2021   Major neurocognitive disorder 03/24/2021   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   Fatty liver    Hardening of the aorta (main artery of the heart)    Insomnia    Pain in right hip    Personality disorder    Pure hypercholesterolemia    Renal cyst    Shoulder joint pain, right    Male erectile dysfunction 12/21/2019   Benign prostatic hyperplasia without lower urinary  tract symptoms 02/05/2018    ONSET DATE: 04/28/2022   REFERRING DIAG: F01.50 (ICD-10-CM) - Vascular dementia, unspecified severity, without behavioral disturbance, psychotic disturbance, mood disturbance, and anxiety  THERAPY DIAG:  Cognitive communication deficit  Rationale for Evaluation and Treatment: Habilitation  SUBJECTIVE:   SUBJECTIVE STATEMENT: "I'm good" Pt accompanied by: significant other [name]  PERTINENT HISTORY: major neurocognitive disorder, PPA, HAV. Neurospych eval Feb 2023, know to [place] from brief course of therapy (e visits) for cognition and aphasia. Pt did not return to therapy after the 3rd visit.  PAIN:  Are you having pain? Yes, unable to rate due to aphasia/cognition. Bilateral LE  FALLS: Has patient fallen in last 6 months?  Yes, See PT evaluation for details  PATIENT GOALS: "To communicate better"  OBJECTIVE:   TODAY'S TREATMENT:  05/23/22: Provided pt's wife physical copies of healthcare documentation to support her in communicating with the bank and her lawyer. Spouse reports that the external memory aids (signs posted at home) have been useful in curbing troublesome behavior about 25% of the time.  To target executive function, SLP guided pt in structured cognitive ask. In 8/8 opportunities, pt able to identify the target item that did not belong, given rare min-A. Pt required usual max-A to name objects on picture cards with less than 50% accuracy, frequently saying "I don't know". Pt demonstrated good ability to compensate for anomia and describe words using semantic features and gestures. SLP educated spouse on nature of anomia.   05/13/22: Pt accompanied by spouse. She reported that pt has difficulty eating in restaurants and becomes upset when they do not serve the food he wants. SLP educated family on bringing the  pt's preferred food to restaurants so that family can still enjoy places they enjoy. Discussed benefits of routine for individuals with dementia.  Pt experiencing difficulty with change in routine and staying at home all day. SLP facilitated conversation concerning options that engage pt and support his confidence. Consider how to break down tasks to allow him to participate, such as smaller puzzles that are colorful so he can sort pieces.  HEP: Bring photos of people with whom pt interacts to begin creating a memory book.   05/11/22: Pt's wife with c/o pt urinating in inappropriate locations. SLP provided visual aid to support pt and reviewed it with pt. Educated on spouse on importance of self-care and being selective about when to correct the pt's memory errors to reduce stress for both individuals.  SLP discussed benefits of using photos on phone as external memory aid with text to speech. Explained that pt will need frequent repetition to learn how to use external memory aid. Educated pt's spouse on supporting pt's communication by verbal prompting and then providing choices if pt is not responsive to initial prompts. Importance of pt using his language to communicate in order to retain his language abilities.   05/04/22 (eval day): provided an AD companion cards so [name] can discretely let others know that he has dementia and may be inappropriate or confused. Generated strategies for possible visual aids to encourage urinating in the toilet. Provided 2 resources for potential day programs for memory care/respite. Provided form for personally relevant words to be completed before next session by spouse. Requested photos of family/friends pt interacts with regularly   PATIENT EDUCATION: Education details: see today's treatment, patient instructions Person educated: Patient and Spouse Education method: Verbal cues and Handouts Education comprehension: verbalized understanding and needs further  education   GOALS: Goals reviewed with patient? Yes with spouse  SHORT TERM GOALS: Target date: 06/01/22  Pt will use low tech AAC/external aids to communicate wants/needs and family members with min A from spouse Baseline: Goal status: IN PROGRESS  2.  Pt will use visual aids to reduce voiding in inappropriate places by 25% subjectively Baseline:  Goal status: MET  3.  Caregiver will use visual aids/symbols to re-direct pt when he demonstrates inappropriate behavior/interactions Baseline:  Goal status: IN PROGRESS   LONG TERM GOALS: Target date: 06/29/22  Pt/caregiver will use low tech AAC to communicate simple directions, wants/needs Baseline:  Goal status: IN PROGRESS  2.  Pt/caregiver will use external/visual aids to limit excessive cash withdrawls from ATM Baseline:  Goal status: IN PROGRESS  3.  Caregiver will verbalize 3 resources for caregivers for dementia  patients Baseline:  Goal status: IN PROGRESS ASSESSMENT:  CLINICAL IMPRESSION: Patient is a 67 y.o. male who was seen today for severe cognitive impairment and moderate to severe aphasia/PPA. Spouse reports difficulty communicating with her husband. He has been voiding in trash cans in the house, outside the front door and at a gas pump while getting gas. She is unable to redirect him during these episodes and is worried he will be arrested for indecent exposure. He is making  frequent excessive withdraws from ATMs and hiding money. She worries he will give a large amount away or be scammed. She does not have control of his account. [name] required cues to name his spouse and  2 daughters [name] and [name]). He was unable to name his dog or breed ([name], dachshund) and the grocery store they frequent several times a week [name]) despite [employer]. He confused mama with wife and stated I was his daughter. Random, inappropriate utterances "I'm glad I'm not gay anymore" without awareness. I suspect [name] was  unable to read pre-morbidly, therefore written aids/cues may not be effective. At this time, I recommend short/trial course of ST  to train caregiver in strategies to improve communication and support pt's cognition to  reduce caregiver burden and for patient's safety.  Residential memory care may be required in near future for pt's safety.  OBJECTIVE IMPAIRMENTS: include attention, memory, awareness, executive functioning, and aphasia. These impairments are limiting patient from managing medications, managing appointments, managing finances, household responsibilities, ADLs/IADLs, and effectively communicating at home and in community. Factors affecting potential to achieve goals and functional outcome are ability to learn/carryover information and cooperation/participation level. Patient will benefit from skilled SLP services to address above impairments and improve overall function.  REHAB POTENTIAL: Fair due to progressive dementia/PPA  PLAN:  SLP FREQUENCY: 2x/week  SLP DURATION: 8 weeks  PLANNED INTERVENTIONS: Language facilitation, Environmental controls, Cueing hierachy, Cognitive reorganization, Internal/external aids, Functional tasks, Multimodal communication approach, SLP instruction and feedback, Compensatory strategies, and Patient/family education    Leroy Libman, Student-SLP 05/23/2022, 11:54 AM

## 2022-05-25 ENCOUNTER — Ambulatory Visit: Payer: Medicare PPO | Admitting: Speech Pathology

## 2022-05-25 ENCOUNTER — Ambulatory Visit: Payer: Medicare PPO | Admitting: Physical Therapy

## 2022-05-26 ENCOUNTER — Ambulatory Visit: Payer: Medicare PPO | Admitting: Podiatry

## 2022-05-26 DIAGNOSIS — Z79899 Other long term (current) drug therapy: Secondary | ICD-10-CM

## 2022-05-26 DIAGNOSIS — B351 Tinea unguium: Secondary | ICD-10-CM | POA: Diagnosis not present

## 2022-05-26 DIAGNOSIS — M2011 Hallux valgus (acquired), right foot: Secondary | ICD-10-CM

## 2022-05-26 NOTE — Progress Notes (Signed)
Subjective: Chief Complaint  Patient presents with   Toe Pain    Right toe follow up    67 year old male presents the office today with family members for concerns of a painful bunion.  He has taken offloading pads he is not wearing them.  No open lesions of the report.  No injuries.  Patient reports on occasion all the toes will swell.  Also he has nail fungus and he was previously prescribed Lamisil which she took but did not finish it seems.  Objective: AAO x3, NAD DP/PT pulses palpable bilaterally, CRT less than 3 seconds There is a bunion present on the right foot and there is mild inflammation along the medial aspect of the first metatarsal head.  There is no cellulitis or signs of infection today.  There is minimal edema to the area.  There is no areas of fluctuance or crepitation.  Mild discomfort. Nails are hypertrophic, dystrophic with some old debris and discoloration noted with yellow, brown discoloration.  No edema, erythema.  Mild dry skin present the dorsal aspect of the foot. No pain with calf compression, swelling, warmth, erythema  Assessment: Bunion deformity right foot, pain; onychomycosis  Plan: -All treatment options discussed with the patient including all alternatives, risks, complications.  -We discussed different offloading pads I dispensed further offloading pad.  Discussed changing shoes and may be a deeper or extra-depth toebox would be beneficial for him as well. -Previously had a cream for skin from another provider.  I will contact [facility] to see if I order this for him as it was helpful previously. -Patient on Lamisil and it did help but did not complete the full course.  Will recheck blood work today want to proceed with this.  Check CBC and LFT. -Daily foot inspection  Vivi Barrack DPM

## 2022-05-30 ENCOUNTER — Ambulatory Visit: Payer: Medicare PPO | Admitting: Speech Pathology

## 2022-05-30 ENCOUNTER — Encounter: Payer: Self-pay | Admitting: Physical Therapy

## 2022-05-30 ENCOUNTER — Ambulatory Visit: Payer: Medicare PPO | Admitting: Physical Therapy

## 2022-05-30 DIAGNOSIS — R41841 Cognitive communication deficit: Secondary | ICD-10-CM | POA: Diagnosis not present

## 2022-05-30 DIAGNOSIS — R4701 Aphasia: Secondary | ICD-10-CM

## 2022-05-30 DIAGNOSIS — R2689 Other abnormalities of gait and mobility: Secondary | ICD-10-CM

## 2022-05-30 DIAGNOSIS — M6281 Muscle weakness (generalized): Secondary | ICD-10-CM

## 2022-05-30 DIAGNOSIS — R2681 Unsteadiness on feet: Secondary | ICD-10-CM

## 2022-05-30 NOTE — Therapy (Signed)
OUTPATIENT PHYSICAL THERAPY LOWER EXTREMITY TREATMENT   Patient Name: [name] MRN: [id number] DOB:1955/10/24, 67 y.o., male Today's Date: 05/30/2022  END OF SESSION:  PT End of Session - 05/30/22 1111     Visit Number 5    Number of Visits 9   with eval   Date for PT Re-Evaluation 06/15/22   to allow for scheduling delays   Authorization Type [employer]    PT Start Time 1107   pt in restroom upon onset of session   PT Stop Time 1145    PT Time Calculation (min) 38 min    Equipment Utilized During Treatment Gait belt    Activity Tolerance Patient tolerated treatment well    Behavior During Therapy WFL for tasks assessed/performed;Impulsive                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   Semantic vs logopenic   Pure hypercholesterolemia    Renal cyst    Shoulder joint pain, right    Wears glasses    Past Surgical History:  Procedure Laterality Date   TONSILLECTOMY  age 58   TRANSURETHRAL RESECTION OF PROSTATE N/A 02/05/2018   Procedure: TRANSURETHRAL RESECTION OF THE PROSTATE (TURP);  Surgeon: Malen Gauze, MD;  Location: [facility];  Service: Urology;  Laterality: N/A;   Patient Active Problem List   Diagnosis Date Noted   PAD (peripheral artery disease) 02/08/2022   Increased frequency of urination 03/24/2021   Major neurocognitive disorder 03/24/2021   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   Fatty liver    Hardening of the aorta (main artery of the heart)    Insomnia    Pain in right hip    Personality disorder    Pure hypercholesterolemia    Renal  cyst    Shoulder joint pain, right    Male erectile dysfunction 12/21/2019   Benign prostatic hyperplasia without lower urinary tract symptoms 02/05/2018    PCP: Irven Coe, MD  REFERRING PROVIDER: Irven Coe, MD  REFERRING DIAG: R29.898 (ICD-10-CM) - Right leg weakness  THERAPY DIAG:  Other abnormalities of gait and mobility  Muscle weakness (generalized)  Unsteadiness on feet  Rationale for Evaluation and Treatment: Rehabilitation  ONSET DATE: 04/29/2022  SUBJECTIVE:   SUBJECTIVE STATEMENT: Pt ambulates into clinic wall walking, when PT offers assistance pt requests shoulders to ambulate to mat table from restroom.  PT provides education that a RW could be an easy substitute for shoulder holding and safer.  Pt states he does not wish to use a RW.  Pt denies falls, wife states he has not fallen that she is aware of for the last month.  He gestures to and indicates that his right knee and foot are sore today.  PERTINENT HISTORY: Major neurocognitive disorder, Primary progressive aphasia; HAV  PAIN:  Are you having pain? Yes: NPRS scale: "a little"/10 Pain location: Right knee and foot Pain description: sore Aggravating factors: unsure Relieving factors: unsure  PRECAUTIONS: Fall  WEIGHT BEARING RESTRICTIONS: No  FALLS:  Has patient fallen in last 6 months? Yes. Number  of falls too many to count "falls and slips", wife is catching him a lot, needs assist to get back up from the floor  LIVING ENVIRONMENT: Lives with: lives with their spouse Lives in: House/apartment Stairs: Yes: External: 6 steps; on right going up Has following equipment at home: Retail bankerQuad cane small base and Walker - 2 wheeled (pt refuses to use)  OCCUPATION: retired; enjoyed playing basketball  PLOF: Independent with gait and Independent with transfers  PATIENT GOALS: "improve balance, get R leg stronger"  NEXT MD VISIT: nothing scheduled  OBJECTIVE:   DIAGNOSTIC FINDINGS: R ankle MRI  05/08/22 IMPRESSION: 1. No acute osseous abnormality or internal derangement of the right ankle. 2. Mild degenerative changes of the tibiotalar and talonavicular joints.  R foot MRI 05/08/22 IMPRESSION: 1. Subacute fracture of the fifth metatarsal neck without significant displacement. 2. Hallux valgus deformity with moderate osteoarthritis of the first MTP joint.  PATIENT SURVEYS:  LEFS 28/80, 35% of maximal function   COGNITION: Overall cognitive status: History of cognitive impairments - at baseline (vasular dementia, has gotten worse over the past year)   SENSATION: Unable to accurately assess due to cognitive impairments Unable to determine  EDEMA:  None present at eval, wife reports that R knee and R ankle swell (knee>ankle)  POSTURE: rounded shoulders and forward head; keep RLE in outstretched position in sitting  LOWER EXTREMITY ROM: WFL  LOWER EXTREMITY MMT:  MMT Right eval Left eval  Hip flexion 2- 5  Hip extension    Hip abduction    Hip adduction    Hip internal rotation    Hip external rotation    Knee flexion 2- 5  Knee extension 2- 5  Ankle dorsiflexion 2- 5  Ankle plantarflexion    Ankle inversion    Ankle eversion     (Blank rows = not tested)    TODAY'S TREATMENT:                                                                                                                              Gait Gait pattern: decreased hip/knee flexion- Right, decreased ankle dorsiflexion- Right, and trunk flexed Distance walked: 230 ft Assistive device utilized: Walker - 2 wheeled Level of assistance: SBA and CGA Comments:  Right foot-up brace donned prior to ambulation.  Moderate cues for safe approximation to RW especially during left turns.  Pt has a tendency to bring RLE out farther than LLE during limb advancement resulting in hitting RLE on right rear rail of RW.  He improves during second lap.  Pt begins singing during ambulation, with this he improves  gait speed and step size.  Unsure if metronome would improve mechanics in future session.  CURB:  Level of Assistance: CGA Assistive device utilized: Environmental consultantWalker - 2 wheeled Curb Comments: Max cuing for sequencing and approximation to RW during turn on platform.  Pt has moderate carryover noted during initiate of second bout w/ cues to improve safety  on platform during turning, but is able to appropriately sequence LE during ascent and descent without cues.  -Discussed AFO with wife in regards to comfort compared to foot-up, responsibility of donning, pt likely to refuse wearing, and general purpose achieved w/ foot-up, but will trial to illustrate and assess at next session.  -PT ambulates with pt to restroom at end of session, he initiates standing and walking impulsively without prompting and refuses to use AD to access restroom.  PT provides close SBA and intermittent CGA for stability.  Wife ambulates with pt to car.  PATIENT EDUCATION:  Education details: Continued recommendation of using RW or NBQC, see above for bracing edu. Person educated: Patient and Spouse Education method: Explanation, Demonstration, Tactile cues, Verbal cues, and Handouts Education comprehension: returned demonstration, verbal cues required, tactile cues required, and needs further education  HOME EXERCISE PROGRAM: Access Code: [facility] URL: [email or web address] Date: 05/11/2022 Prepared by: Peter Congo  Exercises - Sit to Stand with Resistance Around Legs  - 1 x daily - 7 x weekly - 3 sets - 10 reps - Supine Bridge  - 1 x daily - 7 x weekly - 3 sets - 10 reps - Standing Forward Step Taps with Counter Support  - 1 x daily - 7 x weekly - 3 sets - 10 reps  ASSESSMENT:  CLINICAL IMPRESSION: Continued to trial gait training over level surfaces and curb step using RW and right foot-up brace.  Pt continues to reject use of bracing or AD options at home.  He would benefit from further PT to assess  carryover of safety, sequencing, and AD management to promote improved functional mobility.   OBJECTIVE IMPAIRMENTS: Abnormal gait, decreased balance, decreased cognition, decreased coordination, decreased knowledge of condition, decreased knowledge of use of DME, decreased mobility, difficulty walking, decreased strength, decreased safety awareness, postural dysfunction, and pain.   ACTIVITY LIMITATIONS: carrying, lifting, bending, standing, squatting, stairs, transfers, and locomotion level  PARTICIPATION LIMITATIONS: driving, shopping, community activity, and yard work  PERSONAL FACTORS: Time since onset of injury/illness/exacerbation and 1-2 comorbidities:    Major neurocognitive disorder, Primary progressive aphasia; HAVare also affecting patient's functional outcome.   REHAB POTENTIAL: Fair time since onset, has tried PT before for this problem, cognitive impairments  CLINICAL DECISION MAKING: Unstable/unpredictable  EVALUATION COMPLEXITY: Moderate   GOALS: Goals reviewed with patient? Yes  SHORT TERM GOALS=LONG TERM GOALS due to length of POC  LONG TERM GOALS: Target date: 06/08/2022  Pt will be independent with final HEP for improved strength, balance, transfers and gait. Baseline:  Goal status: INITIAL  2.  Pt will improve gait velocity to at least 2.75 ft/sec for improved gait efficiency and performance at Supervision level  Baseline: 2.54 ft/sec no AD CGA (3/13) Goal status: INITIAL  3.  Pt will improve Berg score to 28/56 for decreased fall risk Baseline: 22/56 (3/13) Goal status: INITIAL  4.  Pt will improve LEFS score to 40% of maximum function (32/80) to demonstrate decreased disability level Baseline: 35% or 28/80 (3/13) Goal status: INITIAL    PLAN:  PT FREQUENCY: 2x/week  PT DURATION: 4 weeks  PLANNED INTERVENTIONS: Therapeutic exercises, Therapeutic activity, Neuromuscular re-education, Balance training, Gait training, Patient/Family education, Self  Care, Joint mobilization, Stair training, Orthotic/Fit training, DME instructions, Taping, Manual therapy, and Re-evaluation  PLAN FOR NEXT SESSION: trial foot up brace again to see if pt agreeable to get one? Add to HEP for RLE strengthening and for balance (squats, marches, hip 3 ways), SciFit vs NuStep, practice with  RW and/or SBQC again, work on curbs, safety w/ reaching and wife would like to trial an AFO to see if it is comfortable and helpful for patient.   Sadie Haber, PT, DPT 05/30/2022, 11:51 AM

## 2022-05-30 NOTE — Therapy (Signed)
OUTPATIENT SPEECH LANGUAGE PATHOLOGY APHASIA EVALUATION   Patient Name: [name] MRN: [id number] DOB:08/03/1955, 67 y.o., male Today's Date: 05/30/2022  PCP: Irven Coe, MD REFERRING PROVIDER: Irven Coe, MD  END OF SESSION:  End of Session - 05/30/22 1032     Visit Number 5    Number of Visits 17    Date for SLP Re-Evaluation 06/29/22    Authorization Type Medicare    SLP Start Time 1017    SLP Stop Time  1100    SLP Time Calculation (min) 43 min    Activity Tolerance Patient tolerated treatment well             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   Semantic vs logopenic   Pure hypercholesterolemia    Renal cyst    Shoulder joint pain, right    Wears glasses    Past Surgical History:  Procedure Laterality Date   TONSILLECTOMY  age 58   TRANSURETHRAL RESECTION OF PROSTATE N/A 02/05/2018   Procedure: TRANSURETHRAL RESECTION OF THE PROSTATE (TURP);  Surgeon: Malen Gauze, MD;  Location: [facility];  Service: Urology;  Laterality: N/A;   Patient Active Problem List   Diagnosis Date Noted   PAD (peripheral artery disease) 02/08/2022   Increased frequency of urination 03/24/2021   Major neurocognitive disorder 03/24/2021   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   Fatty liver    Hardening of the aorta (main artery of the heart)    Insomnia    Pain in right hip    Personality disorder    Pure hypercholesterolemia    Renal cyst    Shoulder joint pain, right    Male erectile dysfunction 12/21/2019   Benign prostatic hyperplasia without lower urinary tract symptoms  02/05/2018    ONSET DATE: 04/28/2022   REFERRING DIAG: F01.50 (ICD-10-CM) - Vascular dementia, unspecified severity, without behavioral disturbance, psychotic disturbance, mood disturbance, and anxiety  THERAPY DIAG:  Cognitive communication deficit  Aphasia  Rationale for Evaluation and Treatment: Habilitation  SUBJECTIVE:   SUBJECTIVE STATEMENT: "We're okay" Pt accompanied by: significant other [name]  PERTINENT HISTORY: major neurocognitive disorder, PPA, HAV. Neurospych eval Feb 2023, know to [place] from brief course of therapy (e visits) for cognition and aphasia. Pt did not return to therapy after the 3rd visit.  PAIN:  Are you having pain? Yes, unable to rate due to aphasia/cognition. Bilateral LE  FALLS: Has patient fallen in last 6 months?  Yes, See PT evaluation for details  PATIENT GOALS: "To communicate better"  OBJECTIVE:   TODAY'S TREATMENT:  05/30/22: Pt able to independently use gestures to supplement his communication when discussing his visit to the beach this past week. Across session, SLP supported pt's comprehension with visual aids (whiteboard, photos, and other images).  Pt with difficulty identifying family members from photos, despite mod to max-A. Occasionally able to recall name, but unable to accurately state relationship. Educating pt's spouse on nature of communication changes with dementia, such as anomia and confusion with names and relationships.  Continued to educate pt's spouse on how to support   05/23/22: Provided pt's wife physical copies of healthcare documentation to support her in communicating with the bank and her lawyer. Spouse reports that the external memory aids (signs posted at home) have been useful in curbing troublesome behavior about 25% of the time.  To target executive function, SLP guided pt in  structured cognitive ask. In 8/8 opportunities, pt able to identify the target item that did not belong, given rare min-A. Pt required usual max-A to name objects on picture cards with less than 50% accuracy, frequently saying "I don't know". Pt demonstrated good ability to compensate for anomia and describe words using semantic features and gestures. SLP educated spouse on nature of anomia.   05/13/22: Pt accompanied by spouse. She reported that pt has difficulty eating in restaurants and becomes upset when they do not serve the food he wants. SLP educated family on bringing the pt's preferred food to restaurants so that family can still enjoy places they enjoy. Discussed benefits of routine for individuals with dementia.  Pt experiencing difficulty with change in routine and staying at home all day. SLP facilitated conversation concerning options that engage pt and support his confidence. Consider how to break down tasks to allow him to participate, such as smaller puzzles that are colorful so he can sort pieces.  HEP: Bring photos of people with whom pt interacts to begin creating a memory book.   05/11/22: Pt's wife with c/o pt urinating in inappropriate locations. SLP provided visual aid to support pt and reviewed it with pt. Educated on spouse on importance of self-care and being selective about when to correct the pt's memory errors to reduce stress for both individuals.  SLP discussed benefits of using photos on phone as external memory aid with text to speech. Explained that pt will need frequent repetition to learn how to use external memory aid. Educated pt's spouse on supporting pt's communication by verbal prompting and then providing choices if pt is not responsive to initial prompts. Importance of pt using his language to communicate in order to retain his language abilities.   05/04/22 (eval day): provided an AD companion cards so [name] can discretely let others know that he has dementia and  may be inappropriate or confused. Generated strategies for possible visual aids to encourage urinating in the toilet. Provided 2 resources for potential day programs for memory care/respite. Provided form for personally relevant words to be completed before next session by spouse. Requested photos of family/friends pt interacts with regularly   PATIENT EDUCATION: Education details: see today's treatment, patient instructions Person educated: Patient and Spouse Education method: Verbal cues and Handouts Education comprehension: verbalized understanding and needs further education   GOALS: Goals reviewed with patient? Yes with spouse  SHORT TERM GOALS: Target date: 06/01/22  Pt will use low tech AAC/external aids to communicate wants/needs and family members with min A from spouse Baseline: Goal status: IN PROGRESS  2.  Pt will use visual aids to reduce voiding in inappropriate  places by 25% subjectively Baseline:  Goal status: MET  3.  Caregiver will use visual aids/symbols to re-direct pt when he demonstrates inappropriate behavior/interactions Baseline:  Goal status: IN PROGRESS   LONG TERM GOALS: Target date: 06/29/22  Pt/caregiver will use low tech AAC to communicate simple directions, wants/needs Baseline:  Goal status: IN PROGRESS  2.  Pt/caregiver will use external/visual aids to limit excessive cash withdrawls from ATM Baseline:  Goal status: IN PROGRESS  3.  Caregiver will verbalize 3 resources for caregivers for dementia patients Baseline:  Goal status: IN PROGRESS ASSESSMENT:  CLINICAL IMPRESSION: Continue addressing pt's memory and communication through structured language task and conversations. Pt benefits from visual aids to support his communication. Pt's spouse is working on finding a day program for pt to attend. Continue to recommend short/trial course of ST  to train caregiver in strategies to improve communication and support pt's cognition to  reduce  caregiver burden and for patient's safety. Residential memory care may be required in near future for pt's safety.  OBJECTIVE IMPAIRMENTS: include attention, memory, awareness, executive functioning, and aphasia. These impairments are limiting patient from managing medications, managing appointments, managing finances, household responsibilities, ADLs/IADLs, and effectively communicating at home and in community. Factors affecting potential to achieve goals and functional outcome are ability to learn/carryover information and cooperation/participation level. Patient will benefit from skilled SLP services to address above impairments and improve overall function.  REHAB POTENTIAL: Fair due to progressive dementia/PPA  PLAN:  SLP FREQUENCY: 2x/week  SLP DURATION: 8 weeks  PLANNED INTERVENTIONS: Language facilitation, Environmental controls, Cueing hierachy, Cognitive reorganization, Internal/external aids, Functional tasks, Multimodal communication approach, SLP instruction and feedback, Compensatory strategies, and Patient/family education    Alice ReichertLovvorn, Radene JourneyLaura Ann, CCC-SLP 05/30/2022, 3:35 PM

## 2022-05-31 ENCOUNTER — Other Ambulatory Visit: Payer: Self-pay

## 2022-05-31 ENCOUNTER — Telehealth: Payer: Self-pay | Admitting: Physician Assistant

## 2022-05-31 NOTE — Telephone Encounter (Signed)
Patient needs a refill on the [facility] on [address]

## 2022-06-01 ENCOUNTER — Other Ambulatory Visit: Payer: Self-pay

## 2022-06-01 ENCOUNTER — Ambulatory Visit: Payer: Medicare PPO | Admitting: Speech Pathology

## 2022-06-01 ENCOUNTER — Ambulatory Visit: Payer: Medicare PPO | Admitting: Physical Therapy

## 2022-06-01 MED ORDER — QUETIAPINE FUMARATE 25 MG PO TABS: 25.0000 mg | ORAL_TABLET | Freq: Every day | ORAL | 1 refills | Status: DC

## 2022-06-03 ENCOUNTER — Other Ambulatory Visit: Payer: Self-pay | Admitting: Urology

## 2022-06-08 ENCOUNTER — Ambulatory Visit: Payer: Medicare PPO | Admitting: Physical Therapy

## 2022-06-08 ENCOUNTER — Encounter: Payer: Self-pay | Admitting: Speech Pathology

## 2022-06-08 ENCOUNTER — Ambulatory Visit: Payer: Medicare PPO | Admitting: Speech Pathology

## 2022-06-08 DIAGNOSIS — R2689 Other abnormalities of gait and mobility: Secondary | ICD-10-CM

## 2022-06-08 DIAGNOSIS — R41841 Cognitive communication deficit: Secondary | ICD-10-CM

## 2022-06-08 DIAGNOSIS — R4701 Aphasia: Secondary | ICD-10-CM

## 2022-06-08 DIAGNOSIS — M6281 Muscle weakness (generalized): Secondary | ICD-10-CM

## 2022-06-08 DIAGNOSIS — R2681 Unsteadiness on feet: Secondary | ICD-10-CM

## 2022-06-08 NOTE — Therapy (Signed)
OUTPATIENT SPEECH LANGUAGE PATHOLOGY APHASIA EVALUATION & DISCHARGE SUMMARY   Patient Name: [name] MRN: [id number] DOB:August 12, 1955, 67 y.o., male Today's Date: 06/08/2022  PCP: Irven Coe, MD REFERRING PROVIDER: Irven Coe, MD  END OF SESSION:  End of Session - 06/08/22 1019     Visit Number 6    Number of Visits 17    Date for SLP Re-Evaluation 06/29/22    Authorization Type Medicare    SLP Start Time 1015    SLP Stop Time  1100    SLP Time Calculation (min) 45 min    Activity Tolerance Patient tolerated treatment well             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   Semantic vs logopenic   Pure hypercholesterolemia    Renal cyst    Shoulder joint pain, right    Wears glasses    Past Surgical History:  Procedure Laterality Date   TONSILLECTOMY  age 12   TRANSURETHRAL RESECTION OF PROSTATE N/A 02/05/2018   Procedure: TRANSURETHRAL RESECTION OF THE PROSTATE (TURP);  Surgeon: Malen Gauze, MD;  Location: [facility];  Service: Urology;  Laterality: N/A;   Patient Active Problem List   Diagnosis Date Noted   PAD (peripheral artery disease) 02/08/2022   Increased frequency of urination 03/24/2021   Major neurocognitive disorder 03/24/2021   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   Fatty liver    Hardening of the aorta (main artery of the heart)    Insomnia    Pain in right hip    Personality disorder    Pure hypercholesterolemia    Renal cyst    Shoulder joint pain, right    Male erectile dysfunction 12/21/2019   Benign prostatic hyperplasia without lower urinary  tract symptoms 02/05/2018    ONSET DATE: 04/28/2022   REFERRING DIAG: F01.50 (ICD-10-CM) - Vascular dementia, unspecified severity, without behavioral disturbance, psychotic disturbance, mood disturbance, and anxiety  THERAPY DIAG:  Cognitive communication deficit  Aphasia  Rationale for Evaluation and Treatment: Habilitation  SUBJECTIVE:   SUBJECTIVE STATEMENT: "I don't know" Pt accompanied by: self  PERTINENT HISTORY: major neurocognitive disorder, PPA, HAV. Neurospych eval Feb 2023, know to [place] from brief course of therapy (e visits) for cognition and aphasia. Pt did not return to therapy after the 3rd visit.  PAIN:  Are you having pain? Yes, unable to rate due to aphasia/cognition. Bilateral LE  FALLS: Has patient fallen in last 6 months?  Yes, See PT evaluation for details  PATIENT GOALS: "I don't know" re: how did PT go"  OBJECTIVE:   TODAY'S TREATMENT:  4/17/24Tiburcio Bash does not recall his prior PT session. He verbalizes his favorite restaurants ([employer] and [employer]) and verbalized his order at [facility] with min questioning cues. He verbalized his spouse and 2 daughter's names with mod I. He verbalized his vacation spot [place] and that he liked to swim. He verbalized his prior job. In object naming task, he used gestures, function and description for objects he did not name with mod I consistently. He named 3/12 objects independently and 5 with phonemic cues. Gesture/description given for 10/12. In conversation re: personal preferences, [name] verbalized he eats pizza every Friday with picture cues, eats tuna fish. Conversation perseverative on getting a cheeseburger. [name] has been given handout with resources in [place] for dementia services and support.  [name] has toured the [facility] memory day program and is planning on starting  [place] 3 half days and increase to 5 half days if it goes well. At this time d/c ST  05/30/22: Pt able to independently use gestures to supplement his communication when discussing his visit to the beach this past week. Across session, SLP supported pt's comprehension with visual aids (whiteboard, photos, and other images).  Pt with difficulty identifying family members from photos, despite mod to max-A. Occasionally able to recall name, but unable to accurately state relationship. Educating pt's spouse on nature of communication changes with dementia, such as anomia and confusion with names and relationships.  Continued to educate pt's spouse on how to support   05/23/22: Provided pt's wife physical copies of healthcare documentation to support her in communicating with the bank and her lawyer. Spouse reports that the external memory aids (signs posted at home) have been useful in curbing troublesome behavior about 25% of the time.  To target executive function, SLP guided pt in structured cognitive ask. In 8/8 opportunities, pt able to identify the target item that did not belong, given rare min-A. Pt required usual max-A to name objects on picture cards with less than 50% accuracy, frequently saying "I don't know". Pt demonstrated good ability to compensate for anomia and describe words using semantic features and gestures. SLP educated spouse on nature of anomia.   05/13/22: Pt accompanied by spouse. She reported that pt has difficulty eating in restaurants and becomes upset when they do not serve the food he wants. SLP educated family on bringing the pt's preferred food to restaurants so that family can still enjoy places they enjoy. Discussed benefits of routine for individuals with dementia.  Pt experiencing difficulty with change in routine and staying at home all day. SLP facilitated conversation concerning options that engage pt and support his confidence. Consider how to break down tasks to allow  him to participate, such as smaller puzzles that are colorful so he can sort pieces.  HEP: Bring photos of people with whom pt interacts to begin creating a memory book.   05/11/22: Pt's wife with c/o pt urinating in inappropriate locations. SLP provided visual aid to support pt and reviewed it with pt. Educated on spouse on importance of self-care and being selective about when to correct the pt's memory errors to reduce stress for both individuals.  SLP discussed benefits of using photos on phone as external memory aid with text to speech. Explained that pt will need frequent repetition to learn how to use external memory aid. Educated pt's spouse on supporting pt's communication by verbal prompting and then providing choices if pt is not responsive to initial prompts. Importance of pt using his language to  communicate in order to retain his language abilities.     PATIENT EDUCATION: Education details: see today's treatment, patient instructions Person educated: Patient and Spouse Education method: Verbal cues and Handouts Education comprehension: verbalized understanding and needs further education SPEECH THERAPY DISCHARGE SUMMARY  Visits from Start of Care: 6  Current functional level related to goals / functional outcomes: See goals below   Remaining deficits: Severe memory, word finding, attention, decision making impairment   Education / Equipment: Caregiver compensations/ external supports for pt's cognitive and communication impairments, community resources for dementia support   Patient agrees to discharge. Patient goals were partially met. Patient is being discharged due to lack of progress.Marland Kitchen     GOALS: Goals reviewed with patient? Yes with spouse  SHORT TERM GOALS: Target date: 06/01/22  Pt will use low tech AAC/external aids to communicate wants/needs and family members with min A from spouse Baseline: Goal status: MET with gestures and descriptions  2.  Pt will use  visual aids to reduce voiding in inappropriate places by 25% subjectively Baseline:  Goal status: MET  3.  Caregiver will use visual aids/symbols to re-direct pt when he demonstrates inappropriate behavior/interactions Baseline:  Goal status: NOT MET   LONG TERM GOALS: Target date: 06/29/22  Pt/caregiver will use low tech AAC to communicate simple directions, wants/needs Baseline:  Goal status: NOT MET  2.  Pt/caregiver will use external/visual aids to limit excessive cash withdrawls from ATM Baseline:  Goal status: NOT MET  3.  Caregiver will verbalize 3 resources for caregivers for dementia patients Baseline:  Goal status: MET ASSESSMENT:  CLINICAL IMPRESSION: In structured language/naming tasks, [place] consistently uses gestures and descriptions to ID ojbects/words. In conversation he requires choice of 2 or phonemic cues. Pt benefits from visual aids to support his communication. Pt's spouse is working on finding a day program for pt to attend. Use of external visual aids (pictures) not effective in stopping voiding in inappropriate areas ([name] judges about 25% reduction) Giovany's safety and decision making continue to require max A and constant supervision. Recommend day memory program, such as [facility]. Residential memory care may be required in near future for pt's safety. At this time d/c ST. Pt in agreement, caregiver  OBJECTIVE IMPAIRMENTS: include attention, memory, awareness, executive functioning, and aphasia. These impairments are limiting patient from managing medications, managing appointments, managing finances, household responsibilities, ADLs/IADLs, and effectively communicating at home and in community. Factors affecting potential to achieve goals and functional outcome are ability to learn/carryover information and cooperation/participation level. Patient will benefit from skilled SLP services to address above impairments and improve overall function.  REHAB  POTENTIAL: Fair due to progressive dementia/PPA  PLAN:  SLP FREQUENCY: 2x/week  SLP DURATION: 8 weeks  PLANNED INTERVENTIONS: Language facilitation, Environmental controls, Cueing hierachy, Cognitive reorganization, Internal/external aids, Functional tasks, Multimodal communication approach, SLP instruction and feedback, Compensatory strategies, and Patient/family education    Alice Reichert Radene Journey, CCC-SLP 06/08/2022, 11:00 AM

## 2022-06-08 NOTE — Therapy (Signed)
OUTPATIENT PHYSICAL THERAPY LOWER EXTREMITY TREATMENT-DISCHARGE NOTE   Patient Name: [name] MRN: [id number] DOB:1955-08-04, 67 y.o., male Today's Date: 06/08/2022  PHYSICAL THERAPY DISCHARGE SUMMARY  Visits from Start of Care: 6  Current functional level related to goals / functional outcomes: CGA   Remaining deficits: Decreased balance, decreased RLE strength   Education / Equipment: Handout for HEP and where to purchase foot up brace   Patient agrees to discharge. Patient goals were partially met. Patient is being discharged due to lack of progress.   END OF SESSION:  PT End of Session - 06/08/22 0932     Visit Number 6    Number of Visits 9   with eval   Date for PT Re-Evaluation 06/15/22   to allow for scheduling delays   Authorization Type [employer]    PT Start Time 0930    PT Stop Time 1008    PT Time Calculation (min) 38 min    Equipment Utilized During Treatment Gait belt    Activity Tolerance Patient tolerated treatment well    Behavior During Therapy WFL for tasks assessed/performed;Impulsive                 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   Semantic vs logopenic   Pure hypercholesterolemia    Renal cyst    Shoulder joint pain, right    Wears glasses    Past Surgical History:  Procedure Laterality Date   TONSILLECTOMY  age 13   TRANSURETHRAL RESECTION OF PROSTATE N/A 02/05/2018   Procedure: TRANSURETHRAL RESECTION OF THE PROSTATE (TURP);  Surgeon: Malen Gauze, MD;  Location: [facility];  Service: Urology;  Laterality: N/A;   Patient  Active Problem List   Diagnosis Date Noted   PAD (peripheral artery disease) 02/08/2022   Increased frequency of urination 03/24/2021   Major neurocognitive disorder 03/24/2021   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   Fatty liver    Hardening of the aorta (main artery of the heart)    Insomnia    Pain in right hip    Personality disorder    Pure hypercholesterolemia    Renal cyst    Shoulder joint pain, right    Male erectile dysfunction 12/21/2019   Benign prostatic hyperplasia without lower urinary tract symptoms 02/05/2018    PCP: Irven Coe, MD  REFERRING PROVIDER: Irven Coe, MD  REFERRING DIAG: R29.898 (ICD-10-CM) - Right leg weakness  THERAPY DIAG:  Other abnormalities of gait and mobility  Muscle weakness (generalized)  Unsteadiness on feet  Rationale for Evaluation and Treatment: Rehabilitation  ONSET DATE: 04/29/2022  SUBJECTIVE:   SUBJECTIVE STATEMENT: Pt's wife in lobby, declines to come back to therapy gym for session today. Pt is a poor historian but reports no falls or changes since last session. Pt reports no pain in his R foot but continues to drag limb along the floor during gait. Discussed plan to d/c from PT services this date with pt's wife, she is in agreement.  PERTINENT HISTORY: Major neurocognitive disorder, Primary progressive aphasia; HAV  PAIN:  Are you  having pain? Yes: NPRS scale: "a little"/10 Pain location: Right knee and foot Pain description: sore Aggravating factors: unsure Relieving factors: unsure  PRECAUTIONS: Fall  WEIGHT BEARING RESTRICTIONS: No  FALLS:  Has patient fallen in last 6 months? Yes. Number of falls too many to count "falls and slips", wife is catching him a lot, needs assist to get back up from the floor  LIVING ENVIRONMENT: Lives with: lives with their spouse Lives in: House/apartment Stairs: Yes: External: 6 steps; on right going up Has following equipment at home: Retail banker -  2 wheeled (pt refuses to use)  OCCUPATION: retired; enjoyed playing basketball  PLOF: Independent with gait and Independent with transfers  PATIENT GOALS: "improve balance, get R leg stronger"  NEXT MD VISIT: nothing scheduled  OBJECTIVE:   DIAGNOSTIC FINDINGS: R ankle MRI 05/08/22 IMPRESSION: 1. No acute osseous abnormality or internal derangement of the right ankle. 2. Mild degenerative changes of the tibiotalar and talonavicular joints.  R foot MRI 05/08/22 IMPRESSION: 1. Subacute fracture of the fifth metatarsal neck without significant displacement. 2. Hallux valgus deformity with moderate osteoarthritis of the first MTP joint.  PATIENT SURVEYS:  LEFS 28/80, 35% of maximal function on eval; 54/80 or 67.5% of maximal function (4/17)   COGNITION: Overall cognitive status: History of cognitive impairments - at baseline (vasular dementia, has gotten worse over the past year)   SENSATION: Unable to accurately assess due to cognitive impairments Unable to determine  EDEMA:  None present at eval, wife reports that R knee and R ankle swell (knee>ankle)  POSTURE: rounded shoulders and forward head; keep RLE in outstretched position in sitting  LOWER EXTREMITY ROM: WFL  LOWER EXTREMITY MMT:  MMT Right eval Left eval  Hip flexion 2- 5  Hip extension    Hip abduction    Hip adduction    Hip internal rotation    Hip external rotation    Knee flexion 2- 5  Knee extension 2- 5  Ankle dorsiflexion 2- 5  Ankle plantarflexion    Ankle inversion    Ankle eversion     (Blank rows = not tested)    TODAY'S TREATMENT:                                                                                                                              Gait Gait pattern: decreased hip/knee flexion- Right, decreased ankle dorsiflexion- Right, lateral lean- Left, trunk flexed, and poor foot clearance- Right Distance walked: short clinic distances Assistive device utilized:  None Level of assistance: CGA Comments: trial gait with both [place] PLS and [place] PLS, pt with discomfort with both braces and states he would not wear if given his own AFO to wear  Pt continues to drag his RLE with gait but does not respond to cues to try and increase hip or knee flexion during gait, refuses to wear any sort of ankle bracing orthotic, and refuses to use  an AD.    TherAct Reassessed for LTG assessment: LEFS: 54/80, 67.5%   [facility] PT Assessment - 06/08/22 0942       Ambulation/Gait   Gait velocity 32.8 ft over 16.19 sec = 2.03 ft/sec      Berg Balance Test   Sit to Stand Able to stand  independently using hands    Standing Unsupported Able to stand safely 2 minutes    Sitting with Back Unsupported but Feet Supported on Floor or Stool Able to sit safely and securely 2 minutes    Stand to Sit Sits safely with minimal use of hands    Transfers Able to transfer safely, definite need of hands    Standing Unsupported with Eyes Closed Able to stand 10 seconds with supervision    Standing Unsupported with Feet Together Able to place feet together independently and stand for 1 minute with supervision    From Standing, Reach Forward with Outstretched Arm Reaches forward but needs supervision    From Standing Position, Pick up Object from Floor Unable to try/needs assist to keep balance    From Standing Position, Turn to Look Behind Over each Shoulder Turn sideways only but maintains balance    Turn 360 Degrees Needs close supervision or verbal cueing    Standing Unsupported, Alternately Place Feet on Step/Stool Needs assistance to keep from falling or unable to try    Standing Unsupported, One Foot in Front Needs help to step but can hold 15 seconds    Standing on One Leg Unable to try or needs assist to prevent fall    Total Score 29    Berg comment: 29/56, high fall risk              PATIENT EDUCATION:  Education details: Continued recommendation of using RW or  NBQC, see above for bracing edu. PT POC. Person educated: Patient and Spouse Education method: Explanation, Demonstration, Tactile cues, and Verbal cues Education comprehension: returned demonstration, verbal cues required, tactile cues required, and needs further education  HOME EXERCISE PROGRAM: Access Code: [email or web address] URL: [email or web address] Date: 05/11/2022 Prepared by: Peter Congo  Exercises - Sit to Stand with Resistance Around Legs  - 1 x daily - 7 x weekly - 3 sets - 10 reps - Supine Bridge  - 1 x daily - 7 x weekly - 3 sets - 10 reps - Standing Forward Step Taps with Counter Support  - 1 x daily - 7 x weekly - 3 sets - 10 reps  ASSESSMENT:  CLINICAL IMPRESSION: Emphasis of skilled PT session on reassessing LTG in preparation for d/c from PT services this date. Also assessed various AFO options to determine if pt could benefit from use of a brace. Pt has met 2/4 LTG due to improving his BBS score and his LEFS score demonstrating improved overall balance and decreased disability level. Pt exhibits a slower gait speed that upon initial assessment and is not independent with his HEP due to cognitive impairments. Pt has made overall minimal improvement with physical therapy due to significant cognitive impairments leading to decreased insight and understanding of his physical impairments. Pt has been non-compliant with his HEP, advice to use an AD, and advice that he could benefit from use of a foot-up brace or AFO. Pt to d/c from OPPT services due to non-compliance and lack of progress due to cognitive impairments.   OBJECTIVE IMPAIRMENTS: Abnormal gait, decreased balance, decreased cognition, decreased coordination, decreased knowledge of condition, decreased knowledge of use of DME,  decreased mobility, difficulty walking, decreased strength, decreased safety awareness, postural dysfunction, and pain.   ACTIVITY LIMITATIONS: carrying, lifting, bending, standing,  squatting, stairs, transfers, and locomotion level  PARTICIPATION LIMITATIONS: driving, shopping, community activity, and yard work  PERSONAL FACTORS: Time since onset of injury/illness/exacerbation and 1-2 comorbidities:    Major neurocognitive disorder, Primary progressive aphasia; HAVare also affecting patient's functional outcome.   REHAB POTENTIAL: Fair time since onset, has tried PT before for this problem, cognitive impairments  CLINICAL DECISION MAKING: Unstable/unpredictable  EVALUATION COMPLEXITY: Moderate   GOALS: Goals reviewed with patient? Yes  SHORT TERM GOALS=LONG TERM GOALS due to length of POC  LONG TERM GOALS: Target date: 06/08/2022  Pt will be independent with final HEP for improved strength, balance, transfers and gait. Baseline:  Goal status: NOT MET  2.  Pt will improve gait velocity to at least 2.75 ft/sec for improved gait efficiency and performance at Supervision level  Baseline: 2.54 ft/sec no AD CGA (3/13), 2.03 ft/sec no AD SBA (4/17) Goal status: NOT MET  3.  Pt will improve Berg score to 28/56 for decreased fall risk Baseline: 22/56 (3/13), 29/56 (4/17) Goal status: MET  4.  Pt will improve LEFS score to 40% of maximum function (32/80) to demonstrate decreased disability level Baseline: 35% or 28/80 (3/13), 67.5% or 54/80 (4/17) Goal status: MET       Peter Congo, PT, DPT, CSRS 06/08/2022, 10:13 AM

## 2022-06-13 ENCOUNTER — Other Ambulatory Visit: Payer: Self-pay | Admitting: Podiatry

## 2022-06-13 MED ORDER — KETOCONAZOLE 2 % EX CREA: TOPICAL_CREAM | CUTANEOUS | 2 refills | Status: DC

## 2022-06-16 ENCOUNTER — Other Ambulatory Visit: Payer: Self-pay | Admitting: Family Medicine

## 2022-06-16 DIAGNOSIS — M25561 Pain in right knee: Secondary | ICD-10-CM

## 2022-07-01 ENCOUNTER — Ambulatory Visit (HOSPITAL_BASED_OUTPATIENT_CLINIC_OR_DEPARTMENT_OTHER): Admit: 2022-07-01 | Payer: Medicare PPO | Admitting: Urology

## 2022-07-01 ENCOUNTER — Encounter (HOSPITAL_BASED_OUTPATIENT_CLINIC_OR_DEPARTMENT_OTHER): Payer: Self-pay

## 2022-07-01 ENCOUNTER — Ambulatory Visit
Admission: RE | Admit: 2022-07-01 | Discharge: 2022-07-01 | Disposition: A | Discharge status: Home / Self Care | Payer: Medicare HMO | Source: Ambulatory Visit | Attending: Family Medicine | Admitting: Family Medicine

## 2022-07-01 DIAGNOSIS — M23221 Derangement of posterior horn of medial meniscus due to old tear or injury, right knee: Secondary | ICD-10-CM | POA: Diagnosis not present

## 2022-07-01 DIAGNOSIS — M25561 Pain in right knee: Secondary | ICD-10-CM | POA: Diagnosis not present

## 2022-07-01 SURGERY — BOTOX INJECTION
Anesthesia: Choice

## 2022-07-15 DIAGNOSIS — M21611 Bunion of right foot: Secondary | ICD-10-CM | POA: Diagnosis not present

## 2022-07-15 DIAGNOSIS — M2021 Hallux rigidus, right foot: Secondary | ICD-10-CM | POA: Diagnosis not present

## 2022-07-15 DIAGNOSIS — M21371 Foot drop, right foot: Secondary | ICD-10-CM | POA: Diagnosis not present

## 2022-07-21 DIAGNOSIS — N3941 Urge incontinence: Secondary | ICD-10-CM | POA: Diagnosis not present

## 2022-07-26 ENCOUNTER — Ambulatory Visit: Payer: Medicare HMO | Admitting: Physician Assistant

## 2022-07-26 ENCOUNTER — Encounter: Payer: Self-pay | Admitting: Physician Assistant

## 2022-07-26 VITALS — BP 145/80 | HR 64 | Resp 20 | Ht 66.0 in | Wt 170.0 lb

## 2022-07-26 DIAGNOSIS — F0390 Unspecified dementia without behavioral disturbance: Secondary | ICD-10-CM | POA: Diagnosis not present

## 2022-07-26 DIAGNOSIS — G3101 Pick's disease: Secondary | ICD-10-CM | POA: Diagnosis not present

## 2022-07-26 DIAGNOSIS — F0280 Dementia in other diseases classified elsewhere without behavioral disturbance: Secondary | ICD-10-CM

## 2022-07-26 MED ORDER — RIVASTIGMINE TARTRATE 1.5 MG PO CAPS: ORAL_CAPSULE | ORAL | 3 refills | Status: DC

## 2022-07-26 MED ORDER — QUETIAPINE FUMARATE 25 MG PO TABS: 25.0000 mg | ORAL_TABLET | Freq: Every day | ORAL | 3 refills | Status: DC

## 2022-07-26 MED ORDER — MEMANTINE HCL 10 MG PO TABS: 10.0000 mg | ORAL_TABLET | Freq: Two times a day (BID) | ORAL | 11 refills | Status: DC

## 2022-07-26 NOTE — Patient Instructions (Addendum)
It was a pleasure to see you today at our office.   Recommendations:  Follow up in 6 months   rivastigmine to 1.5  mg twice daily  Memantine 10 mg 2 times a day  Add Seroquel 25 mg at night for mood    Whom to call:  Memory  decline, memory medications: Call our office [phone]   For psychiatric meds, mood meds: Please have your primary care physician manage these medications.   Counseling regarding caregiver distress, including caregiver depression, anxiety and issues regarding community resources, adult day care programs, adult living facilities, or memory care questions:   Feel free to contact Misty Lisabeth Register, Social Worker at [phone]   For assessment of decision of mental capacity and competency:  Call Dr. Erick Blinks, geriatric psychiatrist at [phone]  For guidance in geriatric dementia issues please call [facility] [phone]  For guidance regarding [facility] and if placement were needed at the facility, contact Sidney Ace, Social Worker tel: [phone]  If you have any severe symptoms of a stroke, or other severe issues such as confusion,severe chills or fever, etc call 911 or go to the ER as you may need to be evaluated further   Feel free to visit Facebook page " [email or web address]" for tips of how to care for people with memory problems.      RECOMMENDATIONS FOR ALL PATIENTS WITH MEMORY PROBLEMS: 1. Continue to exercise (Recommend 30 minutes of walking everyday, or 3 hours every week) 2. Increase social interactions - continue going to [place] and enjoy social gatherings with friends and family 3. Eat healthy, avoid fried foods and eat more fruits and vegetables 4. Maintain adequate blood pressure, blood sugar, and blood cholesterol level. Reducing the risk of stroke and cardiovascular disease also helps promoting better memory. 5. Avoid stressful situations. Live a simple life and avoid aggravations. Organize your  time and prepare for the next day in anticipation. 6. Sleep well, avoid any interruptions of sleep and avoid any distractions in the bedroom that may interfere with adequate sleep quality 7. Avoid sugar, avoid sweets as there is a strong link between excessive sugar intake, diabetes, and cognitive impairment We discussed the Mediterranean diet, which has been shown to help patients reduce the risk of progressive memory disorders and reduces cardiovascular risk. This includes eating fish, eat fruits and green leafy vegetables, nuts like almonds and hazelnuts, walnuts, and also use olive oil. Avoid fast foods and fried foods as much as possible. Avoid sweets and sugar as sugar use has been linked to worsening of memory function.  There is always a concern of gradual progression of memory problems. If this is the case, then we may need to adjust level of care according to patient needs. Support, both to the patient and caregiver, should then be put into place.    The [employer] Association is here all day, every day for people facing Alzheimer's disease through our free 24/7 Helpline: [phone]. The Helpline provides reliable information and support to all those who need assistance, such as individuals living with memory loss, Alzheimer's or other dementia, caregivers, health care professionals and the public.  Our highly trained and knowledgeable staff can help you with: Understanding memory loss, dementia and Alzheimer's  Medications and other treatment options  General information about aging and brain health  Skills to provide quality care and to find the best care from professionals  Legal, financial and living-arrangement decisions Our Helpline also features: Confidential care consultation provided  by master's level clinicians who can help with decision-making support, crisis assistance and education on issues families face every day  Help in a caller's preferred language using our  translation service that features more than 200 languages and dialects  Referrals to local community programs, services and ongoing support     FALL PRECAUTIONS: Be cautious when walking. Scan the area for obstacles that may increase the risk of trips and falls. When getting up in the mornings, sit up at the edge of the bed for a few minutes before getting out of bed. Consider elevating the bed at the head end to avoid drop of blood pressure when getting up. Walk always in a well-lit room (use night lights in the walls). Avoid area rugs or power cords from appliances in the middle of the walkways. Use a walker or a cane if necessary and consider physical therapy for balance exercise. Get your eyesight checked regularly.  FINANCIAL OVERSIGHT: Supervision, especially oversight when making financial decisions or transactions is also recommended.  HOME SAFETY: Consider the safety of the kitchen when operating appliances like stoves, microwave oven, and blender. Consider having supervision and share cooking responsibilities until no longer able to participate in those. Accidents with firearms and other hazards in the house should be identified and addressed as well.   ABILITY TO BE LEFT ALONE: If patient is unable to contact 911 operator, consider using LifeLine, or when the need is there, arrange for someone to stay with patients. Smoking is a fire hazard, consider supervision or cessation. Risk of wandering should be assessed by caregiver and if detected at any point, supervision and safe proof recommendations should be instituted.  MEDICATION SUPERVISION: Inability to self-administer medication needs to be constantly addressed. Implement a mechanism to ensure safe administration of the medications.   DRIVING: Regarding driving, in patients with progressive memory problems, driving will be impaired. We advise to have someone else do the driving if trouble finding directions or if minor accidents are  reported. Independent driving assessment is available to determine safety of driving.   If you are interested in the driving assessment, you can contact the following:  [employer] in [place] [phone]  [facility] [phone]  [facility] [phone] or [phone]      Mediterranean Diet A Mediterranean diet refers to food and lifestyle choices that are based on the traditions of countries located on the [employer]. This way of eating has been shown to help prevent certain conditions and improve outcomes for people who have chronic diseases, like kidney disease and heart disease. What are tips for following this plan? Lifestyle  Cook and eat meals together with your family, when possible. Drink enough fluid to keep your urine clear or pale yellow. Be physically active every day. This includes: Aerobic exercise like running or swimming. Leisure activities like gardening, walking, or housework. Get 7-8 hours of sleep each night. If recommended by your health care provider, drink red wine in moderation. This means 1 glass a day for nonpregnant women and 2 glasses a day for men. A glass of wine equals 5 oz (150 mL). Reading food labels  Check the serving size of packaged foods. For foods such as rice and pasta, the serving size refers to the amount of cooked product, not dry. Check the total fat in packaged foods. Avoid foods that have saturated fat or trans fats. Check the ingredients list for added sugars, such as corn syrup. Shopping  At the grocery store, buy most of your food from the areas near the walls of the store. This includes: Fresh fruits and vegetables (produce). Grains, beans, nuts, and seeds. Some of these may be available in unpackaged forms or large amounts (in bulk). Fresh seafood. Poultry and eggs. Low-fat dairy products. Buy whole ingredients instead of prepackaged  foods. Buy fresh fruits and vegetables in-season from local farmers markets. Buy frozen fruits and vegetables in resealable bags. If you do not have access to quality fresh seafood, buy precooked frozen shrimp or canned fish, such as tuna, salmon, or sardines. Buy small amounts of raw or cooked vegetables, salads, or olives from the deli or salad bar at your store. Stock your pantry so you always have certain foods on hand, such as olive oil, canned tuna, canned tomatoes, rice, pasta, and beans. Cooking  Cook foods with extra-virgin olive oil instead of using butter or other vegetable oils. Have meat as a side dish, and have vegetables or grains as your main dish. This means having meat in small portions or adding small amounts of meat to foods like pasta or stew. Use beans or vegetables instead of meat in common dishes like chili or lasagna. Experiment with different cooking methods. Try roasting or broiling vegetables instead of steaming or sauteing them. Add frozen vegetables to soups, stews, pasta, or rice. Add nuts or seeds for added healthy fat at each meal. You can add these to yogurt, salads, or vegetable dishes. Marinate fish or vegetables using olive oil, lemon juice, garlic, and fresh herbs. Meal planning  Plan to eat 1 vegetarian meal one day each week. Try to work up to 2 vegetarian meals, if possible. Eat seafood 2 or more times a week. Have healthy snacks readily available, such as: Vegetable sticks with hummus. Greek yogurt. Fruit and nut trail mix. Eat balanced meals throughout the week. This includes: Fruit: 2-3 servings a day Vegetables: 4-5 servings a day Low-fat dairy: 2 servings a day Fish, poultry, or lean meat: 1 serving a day Beans and legumes: 2 or more servings a week Nuts and seeds: 1-2 servings a day Whole grains: 6-8 servings a day Extra-virgin olive oil: 3-4 servings a day Limit red meat and sweets to only a few servings a month What are my food  choices? Mediterranean diet Recommended Grains: Whole-grain pasta. Brown rice. Bulgar wheat. Polenta. Couscous. Whole-wheat bread. [place] Cobb. Vegetables: Artichokes. Beets. Broccoli. Cabbage. Carrots. Eggplant. Green beans. Chard. Kale. Spinach. Onions. Leeks. Peas. Squash. Tomatoes. Peppers. Radishes. Fruits: Apples. Apricots. Avocado. Berries. Bananas. Cherries. Dates. Figs. Grapes. Lemons. Melon. Oranges. Peaches. Plums. Pomegranate. Meats and other protein foods: Beans. Almonds. Sunflower seeds. Pine nuts. Peanuts. Cod. Salmon. Scallops. Shrimp. Tuna. Tilapia. Clams. Oysters. Eggs. Dairy: Low-fat milk. Cheese. Greek yogurt. Beverages: Water. Red wine. Herbal tea. Fats and oils: Extra virgin olive oil. Avocado oil. Grape seed oil. Sweets and desserts: [place] yogurt with honey. Baked apples. Poached pears. Trail mix. Seasoning and other foods: Basil. Cilantro. Coriander. Cumin. Mint. Parsley. Sage. Rosemary. Tarragon. Garlic. Oregano. Thyme. Pepper. Balsalmic vinegar. Tahini. Hummus. Tomato sauce. Olives. Mushrooms. Limit these Grains: Prepackaged pasta or rice dishes. Prepackaged cereal with added sugar. Vegetables: Deep fried potatoes (french fries). Fruits: Fruit canned in syrup. Meats and other protein foods: Beef. Pork. Lamb. Poultry with skin. Hot dogs. Tomasa Blase. Dairy: Ice cream. Sour cream. Whole milk. Beverages: Juice. Sugar-sweetened soft drinks. Beer. Liquor and spirits. Fats and oils: Butter. Canola oil. Vegetable oil. Beef fat (tallow). Lard. Sweets and desserts: Cookies.  Cakes. Pies. Candy. Seasoning and other foods: Mayonnaise. Premade sauces and marinades. The items listed may not be a complete list. Talk with your dietitian about what dietary choices are right for you. Summary The Mediterranean diet includes both food and lifestyle choices. Eat a variety of fresh fruits and vegetables, beans, nuts, seeds, and whole grains. Limit the amount of red meat and sweets that  you eat. Talk with your health care provider about whether it is safe for you to drink red wine in moderation. This means 1 glass a day for nonpregnant women and 2 glasses a day for men. A glass of wine equals 5 oz (150 mL). This information is not intended to replace advice given to you by your health care provider. Make sure you discuss any questions you have with your health care provider. Document Released: 10/01/2015 Document Revised: 11/03/2015 Document Reviewed: 10/01/2015 Elsevier Interactive Patient Education  2017 [employer].
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
## 2022-07-28 ENCOUNTER — Ambulatory Visit: Payer: Medicare PPO | Admitting: Podiatry

## 2022-07-29 DIAGNOSIS — M1711 Unilateral primary osteoarthritis, right knee: Secondary | ICD-10-CM | POA: Diagnosis not present

## 2022-07-29 DIAGNOSIS — M1611 Unilateral primary osteoarthritis, right hip: Secondary | ICD-10-CM | POA: Diagnosis not present

## 2022-08-05 DIAGNOSIS — R351 Nocturia: Secondary | ICD-10-CM | POA: Diagnosis not present

## 2022-08-05 DIAGNOSIS — N311 Reflex neuropathic bladder, not elsewhere classified: Secondary | ICD-10-CM | POA: Diagnosis not present

## 2022-08-05 DIAGNOSIS — M25551 Pain in right hip: Secondary | ICD-10-CM | POA: Diagnosis not present

## 2022-08-05 DIAGNOSIS — N401 Enlarged prostate with lower urinary tract symptoms: Secondary | ICD-10-CM | POA: Diagnosis not present

## 2022-08-16 DIAGNOSIS — M1611 Unilateral primary osteoarthritis, right hip: Secondary | ICD-10-CM | POA: Diagnosis not present

## 2022-08-16 DIAGNOSIS — S83241D Other tear of medial meniscus, current injury, right knee, subsequent encounter: Secondary | ICD-10-CM | POA: Diagnosis not present

## 2022-08-17 ENCOUNTER — Other Ambulatory Visit: Payer: Self-pay | Admitting: Physician Assistant

## 2022-08-18 DIAGNOSIS — R69 Illness, unspecified: Secondary | ICD-10-CM | POA: Diagnosis not present

## 2022-08-24 DIAGNOSIS — F0150 Vascular dementia without behavioral disturbance: Secondary | ICD-10-CM | POA: Diagnosis not present

## 2022-08-24 DIAGNOSIS — E7800 Pure hypercholesterolemia, unspecified: Secondary | ICD-10-CM | POA: Diagnosis not present

## 2022-08-24 DIAGNOSIS — G3101 Pick's disease: Secondary | ICD-10-CM | POA: Diagnosis not present

## 2022-08-24 DIAGNOSIS — M25551 Pain in right hip: Secondary | ICD-10-CM | POA: Diagnosis not present

## 2022-08-24 DIAGNOSIS — N400 Enlarged prostate without lower urinary tract symptoms: Secondary | ICD-10-CM | POA: Diagnosis not present

## 2022-08-24 DIAGNOSIS — K760 Fatty (change of) liver, not elsewhere classified: Secondary | ICD-10-CM | POA: Diagnosis not present

## 2022-08-24 DIAGNOSIS — Z0000 Encounter for general adult medical examination without abnormal findings: Secondary | ICD-10-CM | POA: Diagnosis not present

## 2022-08-24 DIAGNOSIS — I700 Atherosclerosis of aorta: Secondary | ICD-10-CM | POA: Diagnosis not present

## 2022-08-26 ENCOUNTER — Telehealth: Payer: Self-pay | Admitting: Physician Assistant

## 2022-08-26 NOTE — Telephone Encounter (Signed)
Patients wife is calling to see if patietn could be set up for a parkinson test, and to see what stage of dementia patient is in./KB

## 2022-08-30 NOTE — Telephone Encounter (Signed)
Appt scheduled for 08/31/2022 to discuss

## 2022-08-30 NOTE — Telephone Encounter (Signed)
Pts spouse is stopping requesting that the pt have a parkinson test done due to the some new symptoms that she has noticed he is shuffling his feet.  Spouse would like to have a call back to discuss this in detail.  Spouse is also needing a letter stating that pts condition by be effecting him making him think that he is hungry and have just finish eating and he eats every two hours and sometimes less a full course meal and/or a sandwich with other snacks. She is wanting them know that the pt is not starving b/c he has picked up 40+ pounds within a few months.  This letter is for her attorney.

## 2022-08-31 ENCOUNTER — Encounter: Payer: Self-pay | Admitting: Physician Assistant

## 2022-08-31 ENCOUNTER — Ambulatory Visit: Payer: Medicare HMO | Admitting: Physician Assistant

## 2022-08-31 VITALS — BP 110/60 | HR 98 | Resp 20 | Ht 66.0 in | Wt 177.0 lb

## 2022-08-31 DIAGNOSIS — F0390 Unspecified dementia without behavioral disturbance: Secondary | ICD-10-CM

## 2022-08-31 DIAGNOSIS — F0280 Dementia in other diseases classified elsewhere without behavioral disturbance: Secondary | ICD-10-CM

## 2022-08-31 DIAGNOSIS — G3101 Pick's disease: Secondary | ICD-10-CM

## 2022-08-31 NOTE — Patient Instructions (Signed)
It was a pleasure to see you today at our office.   Recommendations:  Follow up in 6 months   rivastigmine to 1.5  mg twice daily  Memantine 10 mg 2 times a day  Seroquel 25 mg at night for mood    Whom to call:  Memory  decline, memory medications: Call our office [phone]   For psychiatric meds, mood meds: Please have your primary care physician manage these medications.   Counseling regarding caregiver distress, including caregiver depression, anxiety and issues regarding community resources, adult day care programs, adult living facilities, or memory care questions:   Feel free to contact Misty Lisabeth Register, Social Worker at [phone]   For assessment of decision of mental capacity and competency:  Call Dr. Erick Blinks, geriatric psychiatrist at [phone]  For guidance in geriatric dementia issues please call [facility] [phone]  For guidance regarding [facility] and if placement were needed at the facility, contact Sidney Ace, Social Worker tel: [phone]  If you have any severe symptoms of a stroke, or other severe issues such as confusion,severe chills or fever, etc call 911 or go to the ER     RECOMMENDATIONS FOR ALL PATIENTS WITH MEMORY PROBLEMS: 1. Continue to exercise (Recommend 30 minutes of walking everyday, or 3 hours every week) 2. Increase social interactions - continue going to [place] and enjoy social gatherings with friends and family 3. Eat healthy, avoid fried foods and eat more fruits and vegetables 4. Maintain adequate blood pressure, blood sugar, and blood cholesterol level. Reducing the risk of stroke and cardiovascular disease also helps promoting better memory. 5. Avoid stressful situations. Live a simple life and avoid aggravations. Organize your time and prepare for the next day in anticipation. 6. Sleep well, avoid any interruptions of sleep and avoid any distractions in the bedroom that may  interfere with adequate sleep quality 7. Avoid sugar, avoid sweets as there is a strong link between excessive sugar intake, diabetes, and cognitive impairment We discussed the Mediterranean diet, which has been shown to help patients reduce the risk of progressive memory disorders and reduces cardiovascular risk. This includes eating fish, eat fruits and green leafy vegetables, nuts like almonds and hazelnuts, walnuts, and also use olive oil. Avoid fast foods and fried foods as much as possible. Avoid sweets and sugar as sugar use has been linked to worsening of memory function.  There is always a concern of gradual progression of memory problems. If this is the case, then we may need to adjust level of care according to patient needs. Support, both to the patient and caregiver, should then be put into place.    The [employer] is here all day, every day for people facing Alzheimer's disease through our free 24/7 Helpline: [phone]. The Helpline provides reliable information and support to all those who need assistance, such as individuals living with memory loss, Alzheimer's or other dementia, caregivers, health care professionals and the public.  Our highly trained and knowledgeable staff can help you with: Understanding memory loss, dementia and Alzheimer's  Medications and other treatment options  General information about aging and brain health  Skills to provide quality care and to find the best care from professionals  Legal, financial and living-arrangement decisions Our Helpline also features: Confidential care consultation provided by master's level clinicians who can help with decision-making support, crisis assistance and education on issues families face every day  Help in a caller's preferred language using our translation service  that features more than 200 languages and dialects  Referrals to local community programs, services and ongoing support     FALL  PRECAUTIONS: Be cautious when walking. Scan the area for obstacles that may increase the risk of trips and falls. When getting up in the mornings, sit up at the edge of the bed for a few minutes before getting out of bed. Consider elevating the bed at the head end to avoid drop of blood pressure when getting up. Walk always in a well-lit room (use night lights in the walls). Avoid area rugs or power cords from appliances in the middle of the walkways. Use a walker or a cane if necessary and consider physical therapy for balance exercise. Get your eyesight checked regularly.  FINANCIAL OVERSIGHT: Supervision, especially oversight when making financial decisions or transactions is also recommended.  HOME SAFETY: Consider the safety of the kitchen when operating appliances like stoves, microwave oven, and blender. Consider having supervision and share cooking responsibilities until no longer able to participate in those. Accidents with firearms and other hazards in the house should be identified and addressed as well.   ABILITY TO BE LEFT ALONE: If patient is unable to contact 911 operator, consider using LifeLine, or when the need is there, arrange for someone to stay with patients. Smoking is a fire hazard, consider supervision or cessation. Risk of wandering should be assessed by caregiver and if detected at any point, supervision and safe proof recommendations should be instituted.  MEDICATION SUPERVISION: Inability to self-administer medication needs to be constantly addressed. Implement a mechanism to ensure safe administration of the medications.   DRIVING: Regarding driving, in patients with progressive memory problems, driving will be impaired. We advise to have someone else do the driving if trouble finding directions or if minor accidents are reported. Independent driving assessment is available to determine safety of driving.   If you are interested in the driving assessment, you can contact  the following:  [employer] in [place] [phone]  [facility] [phone]  [facility] [phone] or [phone]      Mediterranean Diet A Mediterranean diet refers to food and lifestyle choices that are based on the traditions of countries located on the [employer]. This way of eating has been shown to help prevent certain conditions and improve outcomes for people who have chronic diseases, like kidney disease and heart disease. What are tips for following this plan? Lifestyle  Cook and eat meals together with your family, when possible. Drink enough fluid to keep your urine clear or pale yellow. Be physically active every day. This includes: Aerobic exercise like running or swimming. Leisure activities like gardening, walking, or housework. Get 7-8 hours of sleep each night. If recommended by your health care provider, drink red wine in moderation. This means 1 glass a day for nonpregnant women and 2 glasses a day for men. A glass of wine equals 5 oz (150 mL). Reading food labels  Check the serving size of packaged foods. For foods such as rice and pasta, the serving size refers to the amount of cooked product, not dry. Check the total fat in packaged foods. Avoid foods that have saturated fat or trans fats. Check the ingredients list for added sugars, such as corn syrup. Shopping  At the grocery store, buy most of your food from the areas near the walls of the store. This includes: Fresh fruits and vegetables (produce). Grains, beans, nuts, and seeds.  Some of these may be available in unpackaged forms or large amounts (in bulk). Fresh seafood. Poultry and eggs. Low-fat dairy products. Buy whole ingredients instead of prepackaged foods. Buy fresh fruits and vegetables in-season from local farmers markets. Buy frozen fruits and vegetables in resealable bags. If you do not have access to  quality fresh seafood, buy precooked frozen shrimp or canned fish, such as tuna, salmon, or sardines. Buy small amounts of raw or cooked vegetables, salads, or olives from the deli or salad bar at your store. Stock your pantry so you always have certain foods on hand, such as olive oil, canned tuna, canned tomatoes, rice, pasta, and beans. Cooking  Cook foods with extra-virgin olive oil instead of using butter or other vegetable oils. Have meat as a side dish, and have vegetables or grains as your main dish. This means having meat in small portions or adding small amounts of meat to foods like pasta or stew. Use beans or vegetables instead of meat in common dishes like chili or lasagna. Experiment with different cooking methods. Try roasting or broiling vegetables instead of steaming or sauteing them. Add frozen vegetables to soups, stews, pasta, or rice. Add nuts or seeds for added healthy fat at each meal. You can add these to yogurt, salads, or vegetable dishes. Marinate fish or vegetables using olive oil, lemon juice, garlic, and fresh herbs. Meal planning  Plan to eat 1 vegetarian meal one day each week. Try to work up to 2 vegetarian meals, if possible. Eat seafood 2 or more times a week. Have healthy snacks readily available, such as: Vegetable sticks with hummus. Greek yogurt. Fruit and nut trail mix. Eat balanced meals throughout the week. This includes: Fruit: 2-3 servings a day Vegetables: 4-5 servings a day Low-fat dairy: 2 servings a day Fish, poultry, or lean meat: 1 serving a day Beans and legumes: 2 or more servings a week Nuts and seeds: 1-2 servings a day Whole grains: 6-8 servings a day Extra-virgin olive oil: 3-4 servings a day Limit red meat and sweets to only a few servings a month What are my food choices? Mediterranean diet Recommended Grains: Whole-grain pasta. Brown rice. Bulgar wheat. Polenta. Couscous. Whole-wheat bread. Orpah Cobb. Vegetables:  Artichokes. Beets. Broccoli. Cabbage. Carrots. Eggplant. Green beans. Chard. Kale. Spinach. Onions. Leeks. Peas. Squash. Tomatoes. Peppers. Radishes. Fruits: Apples. Apricots. Avocado. Berries. Bananas. Cherries. Dates. Figs. Grapes. Lemons. Melon. Oranges. Peaches. Plums. Pomegranate. Meats and other protein foods: Beans. Almonds. Sunflower seeds. Pine nuts. Peanuts. Cod. Salmon. Scallops. Shrimp. Tuna. Tilapia. Clams. Oysters. Eggs. Dairy: Low-fat milk. Cheese. Greek yogurt. Beverages: Water. Red wine. Herbal tea. Fats and oils: Extra virgin olive oil. Avocado oil. Grape seed oil. Sweets and desserts: [place] yogurt with honey. Baked apples. Poached pears. Trail mix. Seasoning and other foods: Basil. Cilantro. Coriander. Cumin. Mint. Parsley. Sage. Rosemary. Tarragon. Garlic. Oregano. Thyme. Pepper. Balsalmic vinegar. Tahini. Hummus. Tomato sauce. Olives. Mushrooms. Limit these Grains: Prepackaged pasta or rice dishes. Prepackaged cereal with added sugar. Vegetables: Deep fried potatoes (french fries). Fruits: Fruit canned in syrup. Meats and other protein foods: Beef. Pork. Lamb. Poultry with skin. Hot dogs. Tomasa Blase. Dairy: Ice cream. Sour cream. Whole milk. Beverages: Juice. Sugar-sweetened soft drinks. Beer. Liquor and spirits. Fats and oils: Butter. Canola oil. Vegetable oil. Beef fat (tallow). Lard. Sweets and desserts: Cookies. Cakes. Pies. Candy. Seasoning and other foods: Mayonnaise. Premade sauces and marinades. The items listed may not be a complete list. Talk with your dietitian about what dietary choices are  right for you. Summary The Mediterranean diet includes both food and lifestyle choices. Eat a variety of fresh fruits and vegetables, beans, nuts, seeds, and whole grains. Limit the amount of red meat and sweets that you eat. Talk with your health care provider about whether it is safe for you to drink red wine in moderation. This means 1 glass a day for nonpregnant women and 2  glasses a day for men. A glass of wine equals 5 oz (150 mL). This information is not intended to replace advice given to you by your health care provider. Make sure you discuss any questions you have with your health care provider. Document Released: 10/01/2015 Document Revised: 11/03/2015 Document Reviewed: 10/01/2015 Elsevier Interactive Patient Education  2017 [employer].
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
## 2022-09-02 DIAGNOSIS — S83249A Other tear of medial meniscus, current injury, unspecified knee, initial encounter: Secondary | ICD-10-CM | POA: Insufficient documentation

## 2022-09-13 DIAGNOSIS — S83241A Other tear of medial meniscus, current injury, right knee, initial encounter: Secondary | ICD-10-CM | POA: Diagnosis not present

## 2022-10-07 DIAGNOSIS — M21611 Bunion of right foot: Secondary | ICD-10-CM | POA: Diagnosis not present

## 2022-10-07 DIAGNOSIS — M21371 Foot drop, right foot: Secondary | ICD-10-CM | POA: Diagnosis not present

## 2022-10-12 ENCOUNTER — Other Ambulatory Visit: Payer: Self-pay

## 2022-10-12 ENCOUNTER — Emergency Department (HOSPITAL_BASED_OUTPATIENT_CLINIC_OR_DEPARTMENT_OTHER): Payer: Medicare HMO

## 2022-10-12 ENCOUNTER — Emergency Department (HOSPITAL_BASED_OUTPATIENT_CLINIC_OR_DEPARTMENT_OTHER)
Admission: EM | Admit: 2022-10-12 | Discharge: 2022-10-13 | Disposition: A | Discharge status: Home / Self Care | Payer: Medicare HMO | Attending: Emergency Medicine | Admitting: Emergency Medicine

## 2022-10-12 DIAGNOSIS — R109 Unspecified abdominal pain: Secondary | ICD-10-CM

## 2022-10-12 DIAGNOSIS — S3991XA Unspecified injury of abdomen, initial encounter: Secondary | ICD-10-CM | POA: Diagnosis not present

## 2022-10-12 DIAGNOSIS — S299XXA Unspecified injury of thorax, initial encounter: Secondary | ICD-10-CM | POA: Diagnosis not present

## 2022-10-12 DIAGNOSIS — R1031 Right lower quadrant pain: Secondary | ICD-10-CM | POA: Insufficient documentation

## 2022-10-12 DIAGNOSIS — S3993XA Unspecified injury of pelvis, initial encounter: Secondary | ICD-10-CM | POA: Diagnosis not present

## 2022-10-12 LAB — COMPREHENSIVE METABOLIC PANEL
ALT: 34 U/L (ref 0–44)
AST: 25 U/L (ref 15–41)
Albumin: 4.4 g/dL (ref 3.5–5.0)
Alkaline Phosphatase: 78 U/L (ref 38–126)
Anion gap: 10 (ref 5–15)
BUN: 15 mg/dL (ref 8–23)
CO2: 26 mmol/L (ref 22–32)
Calcium: 9.1 mg/dL (ref 8.9–10.3)
Chloride: 103 mmol/L (ref 98–111)
Creatinine, Ser: 0.86 mg/dL (ref 0.61–1.24)
GFR, Estimated: >60 mL/min (ref >60)
Glucose, Bld: 145 mg/dL — ABNORMAL HIGH (ref 70–99)
Potassium: 3.2 mmol/L — ABNORMAL LOW (ref 3.5–5.1)
Sodium: 139 mmol/L (ref 135–145)
Total Bilirubin: 0.4 mg/dL (ref 0.3–1.2)
Total Protein: 7.2 g/dL (ref 6.5–8.1)

## 2022-10-12 LAB — URINALYSIS, ROUTINE W REFLEX MICROSCOPIC
Bilirubin Urine: NEGATIVE
Glucose, UA: NEGATIVE mg/dL
Ketones, ur: NEGATIVE mg/dL
Leukocytes,Ua: NEGATIVE
Nitrite: NEGATIVE
Specific Gravity, Urine: 1.023 (ref 1.005–1.030)
pH: 6 (ref 5.0–8.0)

## 2022-10-12 LAB — CBC
HCT: 39.2 % (ref 39.0–52.0)
Hemoglobin: 13.3 g/dL (ref 13.0–17.0)
MCH: 29.6 pg (ref 26.0–34.0)
MCHC: 33.9 g/dL (ref 30.0–36.0)
MCV: 87.1 fL (ref 80.0–100.0)
Platelets: 272 10*3/uL (ref 150–400)
RBC: 4.5 MIL/uL (ref 4.22–5.81)
RDW: 14.1 % (ref 11.5–15.5)
WBC: 10.9 10*3/uL — ABNORMAL HIGH (ref 4.0–10.5)
nRBC: 0 % (ref 0.0–0.2)

## 2022-10-12 LAB — LIPASE, BLOOD: Lipase: 70 U/L — ABNORMAL HIGH (ref 11–51)

## 2022-10-12 MED ORDER — SODIUM CHLORIDE 0.9 % IV BOLUS
1000.0000 mL | Freq: Once | INTRAVENOUS | Status: AC
  Administered 2022-10-12: 1000 mL via INTRAVENOUS

## 2022-10-12 MED ORDER — IOHEXOL 300 MG/ML  SOLN
100.0000 mL | Freq: Once | INTRAMUSCULAR | Status: AC | PRN
  Administered 2022-10-12: 85 mL via INTRAVENOUS

## 2022-10-12 NOTE — ED Triage Notes (Signed)
Early dementia pt. Some difficulty on complaint in triage. Wife at bedside reports tenderness to right lower abd- will not allow palpation-guarding. Had a fall at adult day care on left side- last Thursday- hematoma to left rib cage. No changes to ambulation-uses walker at baseline. No trauma noted to head or neck.

## 2022-10-12 NOTE — ED Notes (Signed)
Pt taken to CT.

## 2022-10-12 NOTE — Progress Notes (Signed)
Wife questioning the chest xray that was ordered. States it's not his chest that is hurting. It is his stomach. Wants to see doctor before any imaging.

## 2022-10-13 NOTE — ED Provider Notes (Signed)
[place] EMERGENCY DEPARTMENT AT [facility] Provider Note  CSN: [id number] Arrival date & time: 10/12/22 2058  Chief Complaint(s) Abdominal Pain  HPI [name] is a 67 y.o. male with h/o progressive unspecified neurocognitive disorder here for presumed right abdominal pain. He is accompanied by wife who states that the patient became upset while she was tucking in his shirt and palpated his RLQ on repeated attempts. She reports recent fall 1 week ago where he landed on his left torso resulting in left chest wall hematoma. No other acute changes.  Patient has developed aphasia 2/2 his NCD affecting communication. Despite this he denies current discomfort.   Abdominal Pain   Past Medical History 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   Semantic vs logopenic   Pure hypercholesterolemia    Renal cyst    Shoulder joint pain, right    Wears glasses    Patient Active Problem List   Diagnosis Date Noted   PAD (peripheral artery disease) (HCC) 02/08/2022   Increased frequency of urination 03/24/2021   Major neurocognitive disorder 03/24/2021   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   Fatty liver    Hardening of the aorta (main artery of the heart)    Insomnia    Pain in right hip    Personality disorder    Pure hypercholesterolemia    Renal cyst    Shoulder joint pain, right    Male erectile dysfunction 12/21/2019   Benign prostatic hyperplasia without lower urinary tract symptoms 02/05/2018   Home Medication(s) Prior to Admission medications   Medication Sig Start Date End Date Taking?  Authorizing Provider  acetaminophen (TYLENOL) 325 MG tablet 1 tablet as needed    [provider]  diclofenac Sodium (VOLTAREN) 1 % GEL Apply 2 g topically 4 (four) times daily. Rub into affected area of foot 2 to 4 times daily 01/21/22   Vivi Barrack, DPM  ketoconazole (NIZORAL) 2 % cream APPLY TO AFFECTED AREA TWICE A DAY 06/13/22   Vivi Barrack, DPM  memantine (NAMENDA) 10 MG tablet TAKE 1 TABLET BY MOUTH TWICE A DAY 08/17/22   Gwynneth Munson, Sung Amabile, PA-C  mirabegron ER (MYRBETRIQ) 25 MG TB24 tablet Take 1 tablet (25 mg total) by mouth 2 (two) times daily. 02/06/18   Crista Elliot, MD  QUEtiapine (SEROQUEL) 25 MG tablet Take 1 tablet (25 mg total) by mouth at bedtime. 07/26/22   Marcos Eke, PA-C  rivastigmine (EXELON) 1.5 MG capsule 1 capsules bid 07/26/22   Marcos Eke, PA-C  Allergies Patient has no known allergies.  Review of Systems Review of Systems  Gastrointestinal:  Positive for abdominal pain.   As noted in HPI  Physical Exam Vital Signs  I have reviewed the triage vital signs BP (!) 166/80   Pulse 69   Temp 98.4 F (36.9 C) (Oral)   Resp 18   Ht 5\' 6"  (1.676 m)   Wt 80.7 kg   SpO2 98%   BMI 28.73 kg/m   Physical Exam Constitutional:      General: He is not in acute distress.    Appearance: He is well-developed. He is not diaphoretic.  HENT:     Head: Normocephalic.     Right Ear: External ear normal.     Left Ear: External ear normal.  Eyes:     General: No scleral icterus.       Right eye: No discharge.        Left eye: No discharge.     Conjunctiva/sclera: Conjunctivae normal.     Pupils: Pupils are equal, round, and reactive to light.  Cardiovascular:     Rate and Rhythm: Regular rhythm.     Pulses:          Radial pulses are 2+ on the right side and 2+ on the left side.       Dorsalis pedis pulses are  2+ on the right side and 2+ on the left side.     Heart sounds: Normal heart sounds. No murmur heard.    No friction rub. No gallop.  Pulmonary:     Effort: Pulmonary effort is normal. No respiratory distress.     Breath sounds: Normal breath sounds. No stridor.  Chest:    Abdominal:     General: There is no distension.     Palpations: Abdomen is soft.     Tenderness: There is no abdominal tenderness.  Musculoskeletal:     Cervical back: Normal range of motion and neck supple. No bony tenderness.     Thoracic back: No bony tenderness.     Lumbar back: No bony tenderness.     Comments: Clavicle stable. Chest stable to AP/Lat compression. Pelvis stable to Lat compression. No obvious extremity deformity. No chest or abdominal wall contusion.  Skin:    General: Skin is warm.  Neurological:     Mental Status: He is alert.     Comments: Awake and alert. Patient is aphasic but answering yes or no. Moves all extremities though at baseline he has right lower extremity weakness. Sensation intact however patient states he does not feel pain with pinprick throughout entire body.     ED Results and Treatments Labs (all labs ordered are listed, but only abnormal results are displayed) Labs Reviewed  LIPASE, BLOOD - Abnormal; Notable for the following components:      Result Value   Lipase 70 (*)    All other components within normal limits  COMPREHENSIVE METABOLIC PANEL - Abnormal; Notable for the following components:   Potassium 3.2 (*)    Glucose, Bld 145 (*)    All other components within normal limits  CBC - Abnormal; Notable for the following components:   WBC 10.9 (*)    All other components within normal limits  URINALYSIS, ROUTINE W REFLEX MICROSCOPIC - Abnormal; Notable for the following components:   Hgb urine dipstick SMALL (*)    Protein, ur TRACE (*)    Bacteria, UA RARE (*)    All other components within normal limits  EKG  EKG Interpretation Date/Time:    Ventricular Rate:    PR Interval:    QRS Duration:    QT Interval:    QTC Calculation:   R Axis:      Text Interpretation:         Radiology CT CHEST ABDOMEN PELVIS W CONTRAST  Result Date: 10/13/2022 CLINICAL DATA:  Polytrauma, blunt RLQ pain and left chest wall hematoma. Fall. EXAM: CT CHEST, ABDOMEN, AND PELVIS WITH CONTRAST TECHNIQUE: Multidetector CT imaging of the chest, abdomen and pelvis was performed following the standard protocol during bolus administration of intravenous contrast. RADIATION DOSE REDUCTION: This exam was performed according to the departmental dose-optimization program which includes automated exposure control, adjustment of the mA and/or kV according to patient size and/or use of iterative reconstruction technique. CONTRAST:  85mL OMNIPAQUE IOHEXOL 300 MG/ML  SOLN COMPARISON:  04/06/2022 FINDINGS: CT CHEST FINDINGS Cardiovascular: Heart is normal size. Aorta is normal caliber. Mediastinum/Nodes: No mediastinal, hilar, or axillary adenopathy. Trachea and esophagus are unremarkable. Thyroid unremarkable. Lungs/Pleura: Lungs are clear. No focal airspace opacities or suspicious nodules. No effusions. No pneumothorax. Musculoskeletal: Chest wall soft tissues are unremarkable. No acute bony abnormality. CT ABDOMEN PELVIS FINDINGS Hepatobiliary: No hepatic injury or perihepatic hematoma. Gallbladder is unremarkable. Pancreas: No focal abnormality or ductal dilatation. Spleen: No splenic injury or perisplenic hematoma. Adrenals/Urinary Tract: No adrenal hemorrhage or renal injury identified. Bladder is unremarkable. Stomach/Bowel: Normal appendix. Stomach, large and small bowel grossly unremarkable. Vascular/Lymphatic: No evidence of aneurysm or adenopathy. Aortic atherosclerosis. Reproductive: No visible focal abnormality. Other: No free fluid or free air.  Musculoskeletal: No acute bony abnormality. IMPRESSION: No acute findings or significant traumatic injury in the chest, abdomen or pelvis. Aortic atherosclerosis. Electronically Signed   By: Charlett Nose M.D.   On: 10/13/2022 00:50    Medications Ordered in ED Medications  sodium chloride 0.9 % bolus 1,000 mL (0 mLs Intravenous Stopped 10/13/22 0134)  iohexol (OMNIPAQUE) 300 MG/ML solution 100 mL (85 mLs Intravenous Contrast Given 10/12/22 2340)   Procedures Procedures  (including critical care time) Medical Decision Making / ED Course   Medical Decision Making Amount and/or Complexity of Data Reviewed External Data Reviewed: notes.    Details: Neuro clinic notes Labs: ordered. Decision-making details documented in ED Course. Radiology: ordered and independent interpretation performed. Decision-making details documented in ED Course.  Risk Prescription drug management. Decision regarding hospitalization.    Reported right abd tenderness Recent fall.  Differentials considered  Given communication difficulty and exam findings above, will obtain labs and imaging to rule out any serious internal injuries from recent fall or acute intra-abdominal findings.  CBC with mild leukocytosis.  No anemia. Metabolic panel without significant electrolyte derangements or renal sufficiency.  No evidence of bili obstruction.  Mildly elevated lipase is not consistent with acute pancreatitis UA without evidence of infection CT chest abdomen and pelvis negative for any acute injuries or intra-abdominal inflammatory/infectious processes.     Final Clinical Impression(s) / ED Diagnoses Final diagnoses:  Intermittent abdominal pain   The patient appears reasonably screened and/or stabilized for discharge and I doubt any other medical condition or other [facility] requiring further screening, evaluation, treatment in the ED, or admission to the hospital at this time. I have discussed the findings, Dx and Tx  plan with the patient/family who expressed understanding and agree(s) with the plan. Discharge instructions discussed at length. The patient/family was given strict return precautions who verbalized understanding of the instructions. No further questions at time of  discharge.  Disposition: Discharge  Condition: Good  ED Discharge Orders     None        Follow Up: Irven Coe, MD [address] [place] [address] [phone]  Call  to schedule an appointment for close follow up    This chart was dictated using voice recognition software.  Despite best efforts to proofread,  errors can occur which can change the documentation meaning.    Nira Conn, MD 10/13/22 2106

## 2022-10-14 ENCOUNTER — Other Ambulatory Visit: Payer: Self-pay | Admitting: Physician Assistant

## 2022-10-19 DIAGNOSIS — Z1211 Encounter for screening for malignant neoplasm of colon: Secondary | ICD-10-CM | POA: Diagnosis not present

## 2022-10-19 DIAGNOSIS — K5730 Diverticulosis of large intestine without perforation or abscess without bleeding: Secondary | ICD-10-CM | POA: Diagnosis not present

## 2022-10-19 DIAGNOSIS — K648 Other hemorrhoids: Secondary | ICD-10-CM | POA: Diagnosis not present

## 2022-10-19 DIAGNOSIS — K635 Polyp of colon: Secondary | ICD-10-CM | POA: Diagnosis not present

## 2022-10-19 DIAGNOSIS — D125 Benign neoplasm of sigmoid colon: Secondary | ICD-10-CM | POA: Diagnosis not present

## 2022-10-19 LAB — HM COLONOSCOPY

## 2022-10-21 DIAGNOSIS — K635 Polyp of colon: Secondary | ICD-10-CM | POA: Diagnosis not present

## 2022-10-21 DIAGNOSIS — D125 Benign neoplasm of sigmoid colon: Secondary | ICD-10-CM | POA: Diagnosis not present

## 2022-10-25 DIAGNOSIS — S83241D Other tear of medial meniscus, current injury, right knee, subsequent encounter: Secondary | ICD-10-CM | POA: Diagnosis not present

## 2022-10-31 DIAGNOSIS — M21371 Foot drop, right foot: Secondary | ICD-10-CM | POA: Diagnosis not present

## 2022-10-31 DIAGNOSIS — M2021 Hallux rigidus, right foot: Secondary | ICD-10-CM | POA: Diagnosis not present

## 2022-11-01 DIAGNOSIS — M7989 Other specified soft tissue disorders: Secondary | ICD-10-CM | POA: Diagnosis not present

## 2022-11-01 DIAGNOSIS — I10 Essential (primary) hypertension: Secondary | ICD-10-CM | POA: Diagnosis not present

## 2022-11-01 DIAGNOSIS — Z993 Dependence on wheelchair: Secondary | ICD-10-CM | POA: Diagnosis not present

## 2022-11-01 DIAGNOSIS — Z9989 Dependence on other enabling machines and devices: Secondary | ICD-10-CM | POA: Diagnosis not present

## 2022-11-22 DIAGNOSIS — M2021 Hallux rigidus, right foot: Secondary | ICD-10-CM | POA: Diagnosis not present

## 2022-11-22 DIAGNOSIS — M21371 Foot drop, right foot: Secondary | ICD-10-CM | POA: Diagnosis not present

## 2022-11-22 DIAGNOSIS — M2011 Hallux valgus (acquired), right foot: Secondary | ICD-10-CM | POA: Diagnosis not present

## 2022-12-20 DIAGNOSIS — Z23 Encounter for immunization: Secondary | ICD-10-CM | POA: Diagnosis not present

## 2022-12-20 DIAGNOSIS — I10 Essential (primary) hypertension: Secondary | ICD-10-CM | POA: Diagnosis not present

## 2022-12-20 DIAGNOSIS — F329 Major depressive disorder, single episode, unspecified: Secondary | ICD-10-CM | POA: Diagnosis not present

## 2023-01-23 ENCOUNTER — Telehealth: Payer: Self-pay | Admitting: Physician Assistant

## 2023-01-23 NOTE — Telephone Encounter (Signed)
Pt wife called no answer left a voice mail to call the office back

## 2023-01-23 NOTE — Telephone Encounter (Signed)
Pt wife called informed We do not have a Child psychotherapist, needs to contact his PCP to have SW help with placement

## 2023-01-23 NOTE — Telephone Encounter (Signed)
Pt wife came into office today and she wanted to speak with [facility]. Stated patient is wanting to urinate everywhere including in public, doesn't want to walk and has to be pushed in a wheelchair, and that he's also talking to himself and waving a billboard signs. Stated he needs some monitoring and wants to know if insurance will pay for rehab a facility.

## 2023-01-26 ENCOUNTER — Ambulatory Visit: Payer: Medicare HMO | Admitting: Physician Assistant

## 2023-01-27 ENCOUNTER — Other Ambulatory Visit: Payer: Self-pay

## 2023-01-27 ENCOUNTER — Emergency Department (HOSPITAL_COMMUNITY): Payer: Medicare HMO

## 2023-01-27 ENCOUNTER — Emergency Department (HOSPITAL_COMMUNITY)
Admission: EM | Admit: 2023-01-27 | Discharge: 2023-01-30 | Disposition: A | Discharge status: Home / Self Care | Payer: Medicare HMO | Attending: Emergency Medicine | Admitting: Emergency Medicine

## 2023-01-27 ENCOUNTER — Encounter (HOSPITAL_COMMUNITY): Payer: Self-pay

## 2023-01-27 DIAGNOSIS — F0390 Unspecified dementia without behavioral disturbance: Secondary | ICD-10-CM | POA: Insufficient documentation

## 2023-01-27 DIAGNOSIS — K449 Diaphragmatic hernia without obstruction or gangrene: Secondary | ICD-10-CM | POA: Diagnosis not present

## 2023-01-27 DIAGNOSIS — R4182 Altered mental status, unspecified: Secondary | ICD-10-CM | POA: Diagnosis not present

## 2023-01-27 DIAGNOSIS — F0392 Unspecified dementia, unspecified severity, with psychotic disturbance: Secondary | ICD-10-CM

## 2023-01-27 DIAGNOSIS — N281 Cyst of kidney, acquired: Secondary | ICD-10-CM | POA: Diagnosis not present

## 2023-01-27 DIAGNOSIS — G9389 Other specified disorders of brain: Secondary | ICD-10-CM | POA: Diagnosis not present

## 2023-01-27 DIAGNOSIS — I700 Atherosclerosis of aorta: Secondary | ICD-10-CM | POA: Diagnosis not present

## 2023-01-27 DIAGNOSIS — R410 Disorientation, unspecified: Secondary | ICD-10-CM | POA: Diagnosis present

## 2023-01-27 DIAGNOSIS — I10 Essential (primary) hypertension: Secondary | ICD-10-CM | POA: Diagnosis not present

## 2023-01-27 DIAGNOSIS — R4781 Slurred speech: Secondary | ICD-10-CM | POA: Diagnosis not present

## 2023-01-27 DIAGNOSIS — M6281 Muscle weakness (generalized): Secondary | ICD-10-CM | POA: Insufficient documentation

## 2023-01-27 DIAGNOSIS — R2689 Other abnormalities of gait and mobility: Secondary | ICD-10-CM | POA: Diagnosis not present

## 2023-01-27 DIAGNOSIS — M2011 Hallux valgus (acquired), right foot: Secondary | ICD-10-CM | POA: Diagnosis not present

## 2023-01-27 DIAGNOSIS — M21371 Foot drop, right foot: Secondary | ICD-10-CM | POA: Diagnosis not present

## 2023-01-27 DIAGNOSIS — R2681 Unsteadiness on feet: Secondary | ICD-10-CM | POA: Insufficient documentation

## 2023-01-27 DIAGNOSIS — R109 Unspecified abdominal pain: Secondary | ICD-10-CM | POA: Insufficient documentation

## 2023-01-27 LAB — COMPREHENSIVE METABOLIC PANEL
ALT: 26 U/L (ref 0–44)
AST: 25 U/L (ref 15–41)
Albumin: 4.2 g/dL (ref 3.5–5.0)
Alkaline Phosphatase: 109 U/L (ref 38–126)
Anion gap: 12 (ref 5–15)
BUN: 14 mg/dL (ref 8–23)
CO2: 20 mmol/L — ABNORMAL LOW (ref 22–32)
Calcium: 9.5 mg/dL (ref 8.9–10.3)
Chloride: 107 mmol/L (ref 98–111)
Creatinine, Ser: 0.96 mg/dL (ref 0.61–1.24)
GFR, Estimated: >60 mL/min (ref >60)
Glucose, Bld: 160 mg/dL — ABNORMAL HIGH (ref 70–99)
Potassium: 3.7 mmol/L (ref 3.5–5.1)
Sodium: 139 mmol/L (ref 135–145)
Total Bilirubin: 0.7 mg/dL (ref <1.2)
Total Protein: 7.2 g/dL (ref 6.5–8.1)

## 2023-01-27 LAB — CBC
HCT: 42 % (ref 39.0–52.0)
Hemoglobin: 14 g/dL (ref 13.0–17.0)
MCH: 29.9 pg (ref 26.0–34.0)
MCHC: 33.3 g/dL (ref 30.0–36.0)
MCV: 89.7 fL (ref 80.0–100.0)
Platelets: 253 10*3/uL (ref 150–400)
RBC: 4.68 MIL/uL (ref 4.22–5.81)
RDW: 13.5 % (ref 11.5–15.5)
WBC: 9.9 10*3/uL (ref 4.0–10.5)
nRBC: 0 % (ref 0.0–0.2)

## 2023-01-27 LAB — URINALYSIS, W/ REFLEX TO CULTURE (INFECTION SUSPECTED)
Bacteria, UA: NONE SEEN
Bilirubin Urine: NEGATIVE
Glucose, UA: NEGATIVE mg/dL
Ketones, ur: NEGATIVE mg/dL
Leukocytes,Ua: NEGATIVE
Nitrite: NEGATIVE
Protein, ur: NEGATIVE mg/dL
Specific Gravity, Urine: 1.021 (ref 1.005–1.030)
pH: 5 (ref 5.0–8.0)

## 2023-01-27 LAB — CBG MONITORING, ED: Glucose-Capillary: 157 mg/dL — ABNORMAL HIGH (ref 70–99)

## 2023-01-27 MED ORDER — RIVASTIGMINE TARTRATE 1.5 MG PO CAPS
1.5000 mg | ORAL_CAPSULE | Freq: Two times a day (BID) | ORAL | Status: DC
  Administered 2023-01-27 – 2023-01-30 (×5): 1.5 mg via ORAL
  Filled 2023-01-27 (×7): qty 1

## 2023-01-27 MED ORDER — MEMANTINE HCL 10 MG PO TABS
10.0000 mg | ORAL_TABLET | Freq: Two times a day (BID) | ORAL | Status: DC
  Administered 2023-01-27 – 2023-01-30 (×6): 10 mg via ORAL
  Filled 2023-01-27 (×6): qty 1

## 2023-01-27 MED ORDER — QUETIAPINE FUMARATE 25 MG PO TABS
25.0000 mg | ORAL_TABLET | Freq: Every day | ORAL | Status: DC
  Administered 2023-01-27 – 2023-01-29 (×3): 25 mg via ORAL
  Filled 2023-01-27 (×3): qty 1

## 2023-01-27 NOTE — ED Provider Notes (Signed)
[place] EMERGENCY DEPARTMENT AT [facility] Provider Note   CSN: [id number] Arrival date & time: 01/27/23  1351     History  Chief Complaint  Patient presents with   Altered Mental Status    [name] is a 67 y.o. male with past medical history of major neurocognitive disorder, primary progressive aphasia, peripheral artery disease, hyperlipidemia presenting to emergency room with 2 years of worsening confusion and and dementia, family reports recently they have noticed increasing confusion, difficulty walking with increased falls and difficulty following commands.  Needing more assistance with activities of daily living.  Patient's caregiver is in the room reporting that she has to assist him with all of his activities of daily living.  Patient currently ambulates with a walker however has had increasing knee for wheelchair. Reports urinary frequency and cough with possible congestion this past week. Family member and patient denies any recent illness, abdominal pain, NVD, fevers, cough, chest pain.   Altered Mental Status      Home Medications Prior to Admission medications   Medication Sig Start Date End Date Taking? Authorizing Provider  acetaminophen (TYLENOL) 325 MG tablet 1 tablet as needed    [provider]  diclofenac Sodium (VOLTAREN) 1 % GEL Apply 2 g topically 4 (four) times daily. Rub into affected area of foot 2 to 4 times daily 01/21/22   Vivi Barrack, DPM  ketoconazole (NIZORAL) 2 % cream APPLY TO AFFECTED AREA TWICE A DAY 06/13/22   Vivi Barrack, DPM  memantine (NAMENDA) 10 MG tablet TAKE 1 TABLET BY MOUTH TWICE A DAY 08/17/22   Gwynneth Munson, Sung Amabile, PA-C  mirabegron ER (MYRBETRIQ) 25 MG TB24 tablet Take 1 tablet (25 mg total) by mouth 2 (two) times daily. 02/06/18   Crista Elliot, MD  QUEtiapine (SEROQUEL) 25 MG tablet TAKE 1 TABLET BY MOUTH EVERYDAY AT BEDTIME 10/14/22   Marcos Eke, PA-C  rivastigmine (EXELON) 1.5 MG  capsule 1 capsules bid 07/26/22   Marcos Eke, PA-C      Allergies    Patient has no known allergies.    Review of Systems   Review of Systems  Constitutional:  Positive for activity change.    Physical Exam Updated Vital Signs BP (!) 166/95 (BP Location: Right Arm)   Pulse 70   Temp 98.7 F (37.1 C) (Oral)   Resp 16   Ht 5\' 6"  (1.676 m)   Wt 82.6 kg   SpO2 100%   BMI 29.38 kg/m  Physical Exam Vitals and nursing note reviewed.  Constitutional:      General: He is not in acute distress.    Appearance: He is not toxic-appearing.  HENT:     Head: Normocephalic and atraumatic.  Eyes:     General: No scleral icterus.    Extraocular Movements: Extraocular movements intact.     Conjunctiva/sclera: Conjunctivae normal.     Pupils: Pupils are equal, round, and reactive to light.  Cardiovascular:     Rate and Rhythm: Normal rate and regular rhythm.     Pulses: Normal pulses.     Heart sounds: Normal heart sounds.  Pulmonary:     Effort: Pulmonary effort is normal. No respiratory distress.     Breath sounds: Normal breath sounds. No stridor. No wheezing, rhonchi or rales.  Chest:     Chest wall: No tenderness.  Abdominal:     General: Abdomen is flat. Bowel sounds are normal.     Palpations: Abdomen is  soft.     Tenderness: There is no abdominal tenderness.  Musculoskeletal:     Right lower leg: No edema.     Left lower leg: No edema.  Skin:    General: Skin is warm and dry.     Capillary Refill: Capillary refill takes less than 2 seconds.     Findings: No lesion.  Neurological:     General: No focal deficit present.     Mental Status: He is alert and oriented to person, place, and time. Mental status is at baseline.     Comments: Able to move extremities with equal strength of lower and upper extremity bilaterally.  Patient unable to comply with sensory deficits secondary to dementia.  No cranial nerve deficit.  Patient is able to follow basic commands.  Patient has  slightly slurred speech which family member confirms this is baseline with no acute change.     ED Results / Procedures / Treatments   Labs (all labs ordered are listed, but only abnormal results are displayed) Labs Reviewed  COMPREHENSIVE METABOLIC PANEL - Abnormal; Notable for the following components:      Result Value   CO2 20 (*)    Glucose, Bld 160 (*)    All other components within normal limits  URINALYSIS, W/ REFLEX TO CULTURE (INFECTION SUSPECTED) - Abnormal; Notable for the following components:   Hgb urine dipstick SMALL (*)    All other components within normal limits  CBG MONITORING, ED - Abnormal; Notable for the following components:   Glucose-Capillary 157 (*)    All other components within normal limits  CBC    EKG None  Radiology CT Head Wo Contrast  Result Date: 01/27/2023 CLINICAL DATA:  Altered mental status EXAM: CT HEAD WITHOUT CONTRAST TECHNIQUE: Contiguous axial images were obtained from the base of the skull through the vertex without intravenous contrast. RADIATION DOSE REDUCTION: This exam was performed according to the departmental dose-optimization program which includes automated exposure control, adjustment of the mA and/or kV according to patient size and/or use of iterative reconstruction technique. COMPARISON:  No prior CT available, correlation is made with MRI head 09/17/2021 FINDINGS: Brain: No evidence of acute infarction, hemorrhage, mass, mass effect, or midline shift. No extra-axial collection. No hydrocephalus. Ventriculomegaly,with particular dilatation of the left greater than right temporal horn, likely sequela of cerebral atrophy, which is most prominent in the temporal lobes. Vascular: No hyperdense vessel. Skull: Negative for fracture or focal lesion. Sinuses/Orbits: Mucosal thickening in the ethmoid air cells. No acute finding in the orbits. Other: The mastoid air cells are well aerated. IMPRESSION: No acute intracranial process.  Electronically Signed   By: Wiliam Ke M.D.   On: 01/27/2023 17:12   DG Chest 2 View  Result Date: 01/27/2023 CLINICAL DATA:  Altered mental status EXAM: CHEST - 2 VIEW COMPARISON:  04/10/2010 FINDINGS: The heart size and mediastinal contours are within normal limits. Mild diffuse bilateral interstitial opacity. Disc degenerative disease of the thoracic spine. IMPRESSION: Mild diffuse bilateral interstitial opacity, consistent with edema or atypical/viral infection. No focal airspace opacity. Electronically Signed   By: Jearld Lesch M.D.   On: 01/27/2023 15:58    Procedures Procedures    Medications Ordered in ED Medications - No data to display  ED Course/ Medical Decision Making/ A&P                                 Medical Decision  Making Risk Prescription drug management.   Raequon Savarese 66 y.o. presented today for AMS. Working DDx that I considered at this time includes, but not limited to, CVA/TIA, arrhythmia, vertigo, medication s/e, orthostatic hypotension, electrolyte abnormalities, dehydration, URI, ACS, UTI, anemia   R/o DDx: These are considered less likely than current impression due to history of present illness, physical exam, lab/imaging findings   Review of prior external notes: None   Pmhx: major neurocognitive disorder, primary progressive aphasia, peripheral artery disease, hyperlipidemia  Unique Tests and My Interpretation:  CBC: No leukocytosis, hemoglobin 14 Blood glucose of 157 CMP: No electrolyte abnormality.  Patient does not have AKI no elevated anion gap UA: UA without bacteria negative nitrates and negative leukocytes     Imaging:  CXR mild bilateral interstitial changes consistent with edema versus atypical viral infection.  Given that patient does not have cough no elevated white blood cell count no fevers no chest pains I do not feel that he needs to be started on an antibiotic for this finding. CT of head no acute intracranial  abnormality   Problem List / ED Course / Critical interventions / Medication management  Patient reported to emergency room with complaint of increased confusion, difficulty walking and struggling with activities of daily living.  Family member reports it has been ongoing for 2 years and gradually worsening.  Patient's family member has not noticed any acute increase in confusion over the past few days she feels like this has been occurring gradually and chronic in nature.  During stay ruled out causes of delirium with no sign of infection and reassuring neurological exam.  Patient does not have urinary tract infection.  Family member is requesting that patient be evaluated by PT and OT and would like patient placed in temporary rehab facility.  Discussed in detail findings from today's exam as well as next steps.  Family patient agreed. I ordered medication including home medications Patients vitals assessed. Upon arrival patient is hemodynamically stable.  I have reviewed the patients home medicines and have made adjustments as needed  Consult: None   Plan:  Patient is medically cleared.  Ordered home medications.  Consult to PT OT with anticipation of temporary placement.  Discussed case with oncoming ED PA.        Final Clinical Impression(s) / ED Diagnoses Final diagnoses:  Dementia, unspecified dementia severity, unspecified dementia type, unspecified whether behavioral, psychotic, or mood disturbance or anxiety [facility])    Rx / DC Orders ED Discharge Orders     None         Tynasia Mccaul, Horald Chestnut, PA-C 01/27/23 2335    Edwin Dada P, DO 02/06/23 1430

## 2023-01-27 NOTE — ED Triage Notes (Signed)
Pt to ED via pov from home. Pt has had increasing confusion. Pt has been speaking with people who are not present. Pt has been urinating in public. Pt has increasingly had trouble walking and has been falling. Pt was seen today for muscle tone in legs. Pt c/o pain in right foot and knee.

## 2023-01-27 NOTE — BH Assessment (Addendum)
[facility] to see.  -The consult for patient @2148  has been deferred to [facility]. [place], the [facility] Care Coordinator, will now oversee and coordinate the necessary services. For any inquiries or follow-up, please contact the [facility] Telecare Coordinator (TCC) at [phone].  -The [facility] provider will notify the care team via chat once the consult time is scheduled. The [facility] care team has been updated accordingly.

## 2023-01-27 NOTE — ED Provider Triage Note (Signed)
Emergency Medicine Provider Triage Evaluation Note  [name] , a 67 y.o. male  was evaluated in triage.  Patient brought in by guardian at bedside who is concern for change in mental status that has been ongoing for past year.  States that patient has been diagnosed with dementia, and his symptoms have been getting worse.  Reports he is talking to people who are not there.  Has been urinating in public. Denies any acute change in the last couple of days.  Patient denies any complaints. Review of Systems  Positive: As above  Negative: As above  Physical Exam  BP (!) 160/89 (BP Location: Right Arm)   Pulse 85   Temp 98.2 F (36.8 C)   Resp 18   Ht 5\' 6"  (1.676 m)   Wt 82.6 kg   SpO2 99%   BMI 29.38 kg/m  Gen:   Awake, no distress   Resp:  Normal effort  MSK:   Moves extremities without difficulty  Other:  No cranial nerve deficits  Medical Decision Making  Medically screening exam initiated at 2:23 PM.  Appropriate orders placed.  Brenyn Patchett was informed that the remainder of the evaluation will be completed by another provider, this initial triage assessment does not replace that evaluation, and the importance of remaining in the ED until their evaluation is complete.     Arabella Merles, PA-C 01/27/23 1425

## 2023-01-28 ENCOUNTER — Encounter (HOSPITAL_COMMUNITY): Payer: Self-pay | Admitting: Psychiatric/Mental Health

## 2023-01-28 DIAGNOSIS — F0392 Unspecified dementia, unspecified severity, with psychotic disturbance: Secondary | ICD-10-CM

## 2023-01-28 DIAGNOSIS — F0390 Unspecified dementia without behavioral disturbance: Secondary | ICD-10-CM | POA: Diagnosis not present

## 2023-01-28 DIAGNOSIS — R4182 Altered mental status, unspecified: Secondary | ICD-10-CM | POA: Diagnosis not present

## 2023-01-28 MED ORDER — ACETAMINOPHEN 325 MG PO TABS
650.0000 mg | ORAL_TABLET | Freq: Once | ORAL | Status: AC
  Administered 2023-01-28: 650 mg via ORAL
  Filled 2023-01-28: qty 2

## 2023-01-28 NOTE — ED Provider Notes (Signed)
Emergency Medicine Observation Re-evaluation Note  [name] is a 67 y.o. male, seen on rounds today.  Pt initially presented to the ED for complaints of Altered Mental Status Currently, the patient is sleeping.  Physical Exam  BP (!) 162/83 (BP Location: Right Arm)   Pulse 64   Temp (!) 97.2 F (36.2 C)   Resp 16   Ht 5\' 6"  (1.676 m)   Wt 82.6 kg   SpO2 100%   BMI 29.38 kg/m  Physical Exam General: Sleeping Cardiac: Extremities well-perfused Lungs: Breathing is unlabored Psych: Deferred  ED Course / MDM  EKG:EKG Interpretation Date/Time:  Friday January 27 2023 13:34:08 EST Ventricular Rate:  86 PR Interval:  144 QRS Duration:  94 QT Interval:  358 QTC Calculation: 428 R Axis:   -23  Text Interpretation: Normal sinus rhythm Moderate voltage criteria for LVH, may be normal variant ( R in aVL , Cornell product ) Borderline ECG When compared with ECG of 10-Apr-2010 17:38, PREVIOUS ECG IS PRESENT Confirmed by Gilda Crease [phone]) on 01/28/2023 10:10:45 AM  I have reviewed the labs performed to date as well as medications administered while in observation.  Recent changes in the last 24 hours include presentation to the emergency department yesterday for progressive confusion and frequency of falls over the past several years.  He has been medically cleared.  Family has requested facility placement.  TTS evaluated and recommends continuing nighttime Seroquel.  He has been psychiatrically cleared.  PT evaluated with recommendations for inpatient therapy.  Plan  Current plan is for facility placement.    Gloris Manchester, MD 01/28/23 1106    Gloris Manchester, MD 01/29/23 1106

## 2023-01-28 NOTE — Evaluation (Signed)
Clinical/Bedside Swallow Evaluation Patient Details  Name: Tyland Reagle MRN: [id number] Date of Birth: 21-Nov-1955  Today's Date: 01/28/2023 Time: SLP Start Time (ACUTE ONLY): 1125 SLP Stop Time (ACUTE ONLY): 1143 SLP Time Calculation (min) (ACUTE ONLY): 18 min  Past Medical History:  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   Semantic vs logopenic   Pure hypercholesterolemia    Renal cyst    Shoulder joint pain, right    Wears glasses    Past Surgical History:  Past Surgical History:  Procedure Laterality Date   TONSILLECTOMY  age 47   TRANSURETHRAL RESECTION OF PROSTATE N/A 02/05/2018   Procedure: TRANSURETHRAL RESECTION OF THE PROSTATE (TURP);  Surgeon: Malen Gauze, MD;  Location: [facility];  Service: Urology;  Laterality: N/A;   HPI:  The patient is a 67yo male who presented to the emergency department with concern for worsening confusion, difficulty ambulating with increased falls and difficulty following commands. Patient with major neurocognitive disorder, primary progressive aphasia, PAD, hyperlipidemia.    Assessment / Plan / Recommendation  Clinical Impression  Patient presents with a normal appearing oropharyngeal swallow with timely mastication of bolus, seemingly timely swallow initiation and no overt s/s of aspiration across consistencies. Noted CXR with possible atypical infection, does not appear to be aspiration related based on current presentation. If this becomes recurrent, and in light of neuro deficits, may wish to consider further swallow testing. At this time, no SLP f/u  indicated. SLP Visit Diagnosis: Dysphagia, unspecified (R13.10)       Diet Recommendation Regular;Thin liquid    Liquid Administration via: Cup;Straw Medication Administration: Whole meds with liquid Supervision: Patient able to self feed Compensations: Slow rate;Small sips/bites Postural Changes: Seated upright at 90 degrees    Other  Recommendations Oral Care Recommendations: Oral care BID    Recommendations for follow up therapy are one component of a multi-disciplinary discharge planning process, led by the attending physician.  Recommendations may be updated based on patient status, additional functional criteria and insurance authorization.  Follow up Recommendations No SLP follow up         Functional Status Assessment Patient has not had a recent decline in their functional status    Swallow Study   General HPI: The patient is a 67yo male who presented to the emergency department with concern for worsening confusion, difficulty ambulating with increased falls and difficulty following commands. Patient with major neurocognitive disorder, primary progressive aphasia, PAD, hyperlipidemia. Type of Study: Bedside Swallow Evaluation Previous Swallow Assessment: none Diet Prior to this Study: NPO Temperature Spikes Noted: No Respiratory Status: Room air History of Recent Intubation: No Behavior/Cognition: Alert;Cooperative;Pleasant mood;Requires cueing Oral Cavity Assessment: Within Functional Limits Oral Care Completed by SLP: No Oral Cavity - Dentition: Adequate natural dentition Vision: Functional for self-feeding Self-Feeding Abilities: Able to feed self Patient Positioning: Upright in bed Baseline Vocal Quality: Normal Volitional Cough: Strong Volitional Swallow: Able to elicit    Oral/Motor/Sensory Function Overall Oral Motor/Sensory Function: Other (comment) (mild discoordination)   Ice Chips Ice chips: Not tested   Thin Liquid Thin Liquid: Within functional  limits Presentation: Straw    Nectar Thick Nectar Thick Liquid: Not tested   Honey Thick Honey Thick Liquid: Not tested   Puree Puree: Within functional limits Presentation: Spoon   Solid     Solid: Within functional limits Presentation: Self Fed     Antoin Dargis MA, CCC-SLP  Tomekia Helton Meryl 01/28/2023,11:47 AM

## 2023-01-28 NOTE — ED Notes (Signed)
SLP came down and assessed PT and cleared him to take medications and gave him a diet

## 2023-01-28 NOTE — ED Notes (Signed)
PT took medications with no choking or coughing, PT did dribble water down the side of his mouth which his family stated was normal.

## 2023-01-28 NOTE — ED Notes (Signed)
Per [facility] coordinator Faith, pt is scheduled for TTS at 01:30 with Tyler Aas.

## 2023-01-28 NOTE — ED Notes (Signed)
EDP at bedside per family request

## 2023-01-28 NOTE — Progress Notes (Signed)
CSW spoke with patient's daughter Rene Kocher to inform her of PT recommendations for short term rehab at [facility]. Rene Kocher states understanding and agreement for CSW to begin SNF search. Rene Kocher states patient has never been to a SNF before but family would prefer placement in [facility].  CSW completed FL2 and faxed patient's clinicals out to obtain bed offers.  Edwin Dada, MSW, LCSW Transitions of Care  Clinical Social Worker II [phone]

## 2023-01-28 NOTE — ED Notes (Signed)
Holding PO meds until speech has seen pt

## 2023-01-28 NOTE — Consult Note (Cosign Needed)
  Pt seen and assessed by [facility] telepsychiatry today and psych cleared. TOC to take over for possible SNF placement. Will remove TTS order.

## 2023-01-28 NOTE — Consult Note (Signed)
[facility] Consult Note  Patient Name: [name] MRN: [id number] DOB: 1955-06-30 DATE OF Consult: 01/28/2023  PRIMARY PSYCHIATRIC DIAGNOSES  1.  Dementia    RECOMMENDATIONS  Recommendations: Medication recommendations:  -- Continue Seroquel 25mg  po at bedtime for hallucinations in the setting of dementia.  Discussed with wife about possibility of increasing medication to 25mg  BID for continued psychosis however resistant for change at this time.   Non-Medication/therapeutic recommendations:  -- Outpatient psychiatric resources upon discharge  Is inpatient psychiatric hospitalization recommended for this patient?  -- No, patient is not an imminent danger to self or others  Communication: Treatment team members (and family members if applicable) who were involved in treatment/care discussions and planning, and with whom we spoke or engaged with via secure text/chat, include the following: Roxy Horseman, PA-C and ED team   Thank you for involving [place] in the care of this patient. If you have any additional questions or concerns, please call [phone] and ask for me or the provider on-call.  TELEPSYCHIATRY ATTESTATION & CONSENT  As the provider for this telehealth consult, I attest that I verified the patient's identity using two separate identifiers, introduced myself to the patient, provided my credentials, disclosed my location, and performed this encounter via a HIPAA-compliant, real-time, face-to-face, two-way, interactive audio and video platform and with the full consent and agreement of the patient (or guardian as applicable.)  Patient physical location: ED in [facility]. Telehealth provider physical location: home office in state of [place].  Video start time: 0018 (Central Time) Video end time: 0105 (Central Time)  IDENTIFYING DATA  [name] is a 67 y.o. year-old male for whom a psychiatric consultation has been ordered by the  primary provider. The patient was identified using two separate identifiers.  CHIEF COMPLAINT/REASON FOR CONSULT  Assisted Living Placement pending PT/ OT   HISTORY OF PRESENT ILLNESS (HPI)  The patient is a 67yo male who presented to the emergency department with concern for worsening confusion, difficulty ambulating with increased falls and difficulty following commands. Patient with major neurocognitive disorder, primary progressive aphasia, PAD, hyperlipidemia. Family reported patient requiring more assistance with ADL's. Patient fully dependent on caregivers for assistance; ambulates with walker however family reported need for wheelchair.   Patient evaluated with spouse present at bedside. Patient is casually dressed, laying on hospital bed, sleeping throughout conversation. He does speak intermittently throughout our conversation. Is able to tell me his name, who his wife is. Patient's spouse shares that patient has been waving at billboards, talking to people that are not there and waving to them. Has started back urinating in public places after a period of cessation. Worsening confusion, difficulty ambulating. Patient now completely dependent on others to help with ADL's. Wife lays out patient's medications at night and in the morning and he takes them without issues.   Spouse states patient sleeps well at night. Intermittent urinary incontinence. Appetite intact. Eats finger foods. Slowing down using utensils to eat. History of aggressive outbursts, intermittently. Last episode several months ago. No recent violence/ aggression. Recently stopped going to geriatric daycare program due to issues in the morning: slow to get ready, decrease motivation to go, causing wife to be late for work. Has 2 sitters that come to the home to assist with care. No suicidal or homicidal ideations. No past psychiatric history.   Wife is looking to place patient in rehabilitation facility to assist with physical  therapy and other needs.    PAST PSYCHIATRIC HISTORY  No  past psychiatric history, hospitalizations  Seroquel 25mg  po at bedtime for sundowning behaviors Neurologist- [name], PA-C; last seen 07/26/2022 upcoming appointment 02/27/2023   Otherwise as per HPI above.  PAST MEDICAL HISTORY  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   Semantic vs logopenic   Pure hypercholesterolemia    Renal cyst    Shoulder joint pain, right    Wears glasses      HOME MEDICATIONS  Facility Ordered Medications  Medication   rivastigmine (EXELON) capsule 1.5 mg   QUEtiapine (SEROQUEL) tablet 25 mg   memantine (NAMENDA) tablet 10 mg   PTA Medications  Medication Sig   mirabegron ER (MYRBETRIQ) 25 MG TB24 tablet Take 1 tablet (25 mg total) by mouth 2 (two) times daily.   acetaminophen (TYLENOL) 325 MG tablet 1 tablet as needed   diclofenac Sodium (VOLTAREN) 1 % GEL Apply 2 g topically 4 (four) times daily. Rub into affected area of foot 2 to 4 times daily   ketoconazole (NIZORAL) 2 % cream APPLY TO AFFECTED AREA TWICE A DAY   rivastigmine (EXELON) 1.5 MG capsule 1 capsules bid   memantine (NAMENDA) 10 MG tablet TAKE 1 TABLET BY MOUTH TWICE A DAY   QUEtiapine (SEROQUEL) 25 MG tablet TAKE 1 TABLET BY MOUTH EVERYDAY AT BEDTIME     ALLERGIES  No Known Allergies  SOCIAL & SUBSTANCE USE HISTORY  Social History   Socioeconomic History   Marital status: Married    Spouse name: Not on file   Number of children: 7   Years of education: 10   Highest education level: 10th grade  Occupational History   Occupation: [employer] examiner  Tobacco Use    Smoking status: Never   Smokeless tobacco: Never  Vaping Use   Vaping status: Never Used  Substance and Sexual Activity   Alcohol use: Not Currently    Comment: occasional   Drug use: Never   Sexual activity: Not on file  Other Topics Concern   Not on file  Social History Narrative   Lives with wife   Right handed   One story home   Daughter is IT sales professional at [employer] unit.   Social Determinants of Health   Financial Resource Strain: Not on file  Food Insecurity: Not on file  Transportation Needs: Not on file  Physical Activity: Not on file  Stress: Not on file  Social Connections: Not on file   Social History   Tobacco Use  Smoking Status Never  Smokeless Tobacco Never   Social History   Substance and Sexual Activity  Alcohol Use Not Currently   Comment: occasional   Social History   Substance and Sexual Activity  Drug Use Never    Additional pertinent information: lives at home with wife.  FAMILY HISTORY  Family History  Problem Relation Age of Onset   Dementia Father      MENTAL STATUS EXAM (MSE)  Presentation  General Appearance: Casual; Well Groomed Eye Contact:Poor Speech:Clear and Coherent; Other (comment) (brief spontaneous speech) Speech Volume:Decreased Handedness:No data recorded  Mood and Affect  Mood:Euthymic Affect:Appropriate  Thought Process  Thought Processes:-- (confusion) Descriptions of Associations:Intact  Orientation:-- (person)  Thought Content:-- (simple)  History of Schizophrenia/Schizoaffective disorder:No data recorded Duration of Psychotic Symptoms:No data recorded Hallucinations:Hallucinations: -- (Wife states patient is having conversations with people who are not there. Waving at billboards.)  Ideas of Reference:None  Suicidal Thoughts:Suicidal Thoughts: No  Homicidal Thoughts:Homicidal Thoughts: No   Sensorium  Memory:Remote Poor; Recent  Poor Judgment:Poor Insight:Poor  Executive Functions  Concentration:Poor Attention Span:Poor Recall:Poor Fund of Knowledge:Poor Language:Fair  Psychomotor Activity  Psychomotor Activity:Psychomotor Activity: Normal  Assets  Assets:Housing; Social Support; Advertising copywriter; Transportation; Financial Resources/Insurance; Physical Health  Sleep  Sleep:Sleep: Good   VITALS  Blood pressure (!) 163/91, pulse 86, temperature 98.4 F (36.9 C), temperature source Oral, resp. rate 20, height 5\' 6"  (1.676 m), weight 82.6 kg, SpO2 100%.  LABS  Admission on 01/27/2023  Component Date Value Ref Range Status   Sodium 01/27/2023 139  135 - 145 mmol/L Final   Potassium 01/27/2023 3.7  3.5 - 5.1 mmol/L Final   Chloride 01/27/2023 107  98 - 111 mmol/L Final   CO2 01/27/2023 20 (L)  22 - 32 mmol/L Final   Glucose, Bld 01/27/2023 160 (H)  70 - 99 mg/dL Final   Glucose reference range applies only to samples taken after fasting for at least 8 hours.   BUN 01/27/2023 14  8 - 23 mg/dL Final   Creatinine, Ser 01/27/2023 0.96  0.61 - 1.24 mg/dL Final   Calcium 30/16/0109 9.5  8.9 - 10.3 mg/dL Final   Total Protein 32/35/5732 7.2  6.5 - 8.1 g/dL Final   Albumin 20/25/4270 4.2  3.5 - 5.0 g/dL Final   AST 62/37/6283 25  15 - 41 U/L Final   ALT 01/27/2023 26  0 - 44 U/L Final   Alkaline Phosphatase 01/27/2023 109  38 - 126 U/L Final   Total Bilirubin 01/27/2023 0.7  <1.2 mg/dL Final   GFR, Estimated 01/27/2023 >60  >60 mL/min Final   Comment: (NOTE) Calculated using the CKD-EPI Creatinine Equation (2021)    Anion gap 01/27/2023 12  5 - 15 Final   Performed at [facility], [address], [place], [place] [address]   WBC 01/27/2023 9.9  4.0 - 10.5 K/uL Final   RBC 01/27/2023 4.68  4.22 - 5.81 MIL/uL Final   Hemoglobin 01/27/2023 14.0  13.0 - 17.0 g/dL Final   HCT 16/08/3708 42.0  39.0 - 52.0 % Final   MCV 01/27/2023 89.7  80.0 - 100.0 fL Final   MCH 01/27/2023 29.9  26.0 - 34.0 pg  Final   MCHC 01/27/2023 33.3  30.0 - 36.0 g/dL Final   RDW 62/69/4854 13.5  11.5 - 15.5 % Final   Platelets 01/27/2023 253  150 - 400 K/uL Final   nRBC 01/27/2023 0.0  0.0 - 0.2 % Final   Performed at [facility], [address] [place], [place] [address]   Glucose-Capillary 01/27/2023 157 (H)  70 - 99 mg/dL Final   Glucose reference range applies only to samples taken after fasting for at least 8 hours.   Specimen Source 01/27/2023 URINE, CLEAN CATCH   Final   Color, Urine 01/27/2023 YELLOW  YELLOW Final   APPearance 01/27/2023 CLEAR  CLEAR Final   Specific Gravity, Urine 01/27/2023 1.021  1.005 - 1.030 Final   pH 01/27/2023 5.0  5.0 - 8.0 Final   Glucose, UA 01/27/2023 NEGATIVE  NEGATIVE mg/dL Final   Hgb urine dipstick 01/27/2023  SMALL (A)  NEGATIVE Final   Bilirubin Urine 01/27/2023 NEGATIVE  NEGATIVE Final   Ketones, ur 01/27/2023 NEGATIVE  NEGATIVE mg/dL Final   Protein, ur 40/98/1191 NEGATIVE  NEGATIVE mg/dL Final   Nitrite 47/82/9562 NEGATIVE  NEGATIVE Final   Leukocytes,Ua 01/27/2023 NEGATIVE  NEGATIVE Final   RBC / HPF 01/27/2023 0-5  0 - 5 RBC/hpf Final   WBC, UA 01/27/2023 0-5  0 - 5 WBC/hpf Final   Comment:        Reflex urine culture not performed if WBC <=10, OR if Squamous epithelial cells >5. If Squamous epithelial cells >5 suggest recollection.    Bacteria, UA 01/27/2023 NONE SEEN  NONE SEEN Final   Squamous Epithelial / HPF 01/27/2023 0-5  0 - 5 /HPF Final   Mucus 01/27/2023 PRESENT   Final   Performed at [facility], [address], [place], [place] [address]    PSYCHIATRIC REVIEW OF SYSTEMS (ROS)  ROS: Notable for the following relevant positive findings: Review of Systems  Psychiatric/Behavioral:  Positive for hallucinations and memory loss. Negative for suicidal ideas.     Additional findings:      Musculoskeletal: No abnormal movements observed      Gait & Station: Wheelchair/Walker      Pain Screening: Denies      Nutrition & Dental  Concerns: No concerns at this time   RISK FORMULATION/ASSESSMENT  Is the patient experiencing any suicidal or homicidal ideations: No       Explain if yes:  Protective factors considered for safety management: access to care, family/ social support, housing, no past psych history.   Risk factors/concerns considered for safety management: cognitive impairment Physical illness/chronic pain Age over 43 Male gender  Is there a safety management plan with the patient and treatment team to minimize risk factors and promote protective factors: Yes           Explain: Currently in the ED, family requesting ALF placement/ rehabilitation placement.  Is crisis care placement or psychiatric hospitalization recommended: No     Based on my current evaluation and risk assessment, patient is determined at this time to be at:  Low risk  *RISK ASSESSMENT Risk assessment is a dynamic process; it is possible that this patient's condition, and risk level, may change. This should be re-evaluated and managed over time as appropriate. Please re-consult psychiatric consult services if additional assistance is needed in terms of risk assessment and management. If your team decides to discharge this patient, please advise the patient how to best access emergency psychiatric services, or to call 911, if their condition worsens or they feel unsafe in any way.   Assunta Gambles, NP Telepsychiatry Consult Services

## 2023-01-28 NOTE — Evaluation (Signed)
Physical Therapy Evaluation Patient Details Name: [name] MRN: [id number] DOB: 03-Aug-1955 Today's Date: 01/28/2023  History of Present Illness  Pt is 67 year old presented to [facility] on  01/27/23 for AMS. PMH - major neurocognitive disorder, primary progressive aphasia, PAD  Clinical Impression  Pt admitted with above diagnosis and presents to PT with functional limitations due to deficits listed below (See PT problem list). Pt needs skilled PT to maximize independence and safety. Pt with decline in functional mobility  over past several months and requiring incr assistance. Patient will benefit from continued inpatient follow up therapy, <3 hours/day to improve his mobility and decr burden of care.           If plan is discharge home, recommend the following: A lot of help with walking and/or transfers;A lot of help with bathing/dressing/bathroom;Assistance with cooking/housework;Direct supervision/assist for medications management;Direct supervision/assist for financial management;Assist for transportation;Help with stairs or ramp for entrance;Supervision due to cognitive status   Can travel by private vehicle   Yes    Equipment Recommendations None recommended by PT  Recommendations for Other Services       Functional Status Assessment Patient has had a recent decline in their functional status and demonstrates the ability to make significant improvements in function in a reasonable and predictable amount of time.     Precautions / Restrictions Precautions Precautions: Fall Restrictions Weight Bearing Restrictions: No      Mobility  Bed Mobility Overal bed mobility: Needs Assistance Bed Mobility: Supine to Sit, Sit to Supine     Supine to sit: Max assist Sit to supine: Max assist   General bed mobility comments: Assist with all aspects primarily due to poor processing    Transfers Overall transfer level: Needs assistance Equipment used: Rollator (4  wheels) Transfers: Sit to/from Stand Sit to Stand: Mod assist, From elevated surface           General transfer comment: Assist to bring hips up and stabilize    Ambulation/Gait Ambulation/Gait assistance: Min assist Gait Distance (Feet): 15 Feet Assistive device: Rollator (4 wheels) (with brakes locked) Gait Pattern/deviations: Step-to pattern, Decreased step length - right, Decreased step length - left, Knee flexed in stance - right, Knee flexed in stance - left, Shuffle Gait velocity: decr Gait velocity interpretation: <1.31 ft/sec, indicative of household ambulator   General Gait Details: Assist for balance and support. Verbal/tactile cues to stay closer to walker. Pt with rigid gait.  Stairs            Wheelchair Mobility     Tilt Bed    Modified Rankin (Stroke Patients Only)       Balance Overall balance assessment: Needs assistance Sitting-balance support: Bilateral upper extremity supported, Feet supported Sitting balance-Leahy Scale: Poor Sitting balance - Comments: Pt able to sit upright using UE support but frequently leaning posteriorly requiring assist to return to upright. Appears primarily cognitive in nature. Postural control: Posterior lean Standing balance support: Bilateral upper extremity supported, During functional activity Standing balance-Leahy Scale: Poor Standing balance comment: walker and min assist for static standing                             Pertinent Vitals/Pain Pain Assessment Pain Assessment:  (Per wife RLE pain at baseline)    Home Living Family/patient expects to be discharged to:: Private residence Living Arrangements: Spouse/significant other Available Help at Discharge: Family;Personal care attendant Type of Home: House Home Access: Stairs  to enter Entrance Stairs-Rails: Right;Left Entrance Stairs-Number of Steps: 4-5   Home Layout: One level Home Equipment: Agricultural consultant (2 wheels);Wheelchair -  manual      Prior Function Prior Level of Function : Needs assist  Cognitive Assist : Mobility (cognitive);ADLs (cognitive) Mobility (Cognitive): Intermittent cues ADLs (Cognitive): Step by step cues Physical Assist : Mobility (physical);ADLs (physical) Mobility (physical): Bed mobility;Transfers;Gait;Stairs ADLs (physical): Bathing;Dressing;Toileting Mobility Comments: Pt amb short distances with walker. Pt needs assist at times       Extremity/Trunk Assessment   Upper Extremity Assessment Upper Extremity Assessment: Defer to OT evaluation    Lower Extremity Assessment Lower Extremity Assessment: Generalized weakness       Communication   Communication Communication: Difficulty following commands/understanding;Difficulty communicating thoughts/reduced clarity of speech Following commands: Follows one step commands with increased time;Follows one step commands inconsistently Cueing Techniques: Gestural cues;Tactile cues  Cognition Arousal: Alert Behavior During Therapy: Flat affect Overall Cognitive Status: History of cognitive impairments - at baseline                                 General Comments: Has had worsening cognition. Follows 1 step commands requiring tactile/visual cues at times        General Comments      Exercises     Assessment/Plan    PT Assessment Patient needs continued PT services  PT Problem List Decreased strength;Decreased balance;Decreased mobility;Decreased cognition;Decreased knowledge of use of DME;Decreased safety awareness       PT Treatment Interventions DME instruction;Gait training;Functional mobility training;Therapeutic activities;Therapeutic exercise;Balance training;Cognitive remediation;Patient/family education;Neuromuscular re-education    PT Goals (Current goals can be found in the Care Plan section)  Acute Rehab PT Goals Patient Stated Goal: wife would like rehab PT Goal Formulation: With  patient/family Time For Goal Achievement: 02/11/23 Potential to Achieve Goals: Good    Frequency Min 1X/week     Co-evaluation               AM-PAC PT "6 Clicks" Mobility  Outcome Measure Help needed turning from your back to your side while in a flat bed without using bedrails?: A Lot Help needed moving from lying on your back to sitting on the side of a flat bed without using bedrails?: A Lot Help needed moving to and from a bed to a chair (including a wheelchair)?: A Lot Help needed standing up from a chair using your arms (e.g., wheelchair or bedside chair)?: A Lot Help needed to walk in hospital room?: Total Help needed climbing 3-5 steps with a railing? : Total 6 Click Score: 10    End of Session Equipment Utilized During Treatment: Gait belt Activity Tolerance: Patient tolerated treatment well Patient left: in bed;with bed alarm set;with family/visitor present   PT Visit Diagnosis: Unsteadiness on feet (R26.81);Other abnormalities of gait and mobility (R26.89);Muscle weakness (generalized) (M62.81);Other symptoms and signs involving the nervous system (R29.898)    Time: 8469-6295 PT Time Calculation (min) (ACUTE ONLY): 20 min   Charges:   PT Evaluation $PT Eval Moderate Complexity: 1 Mod   PT General Charges $$ ACUTE PT VISIT: 1 Visit         [facility] PT Acute Rehabilitation Services Office [phone]   Angelina Ok [facility] 01/28/2023, 10:34 AM

## 2023-01-28 NOTE — ED Notes (Signed)
[name] the social worker called and spoke with PT's daughter about placing him in a SNF.

## 2023-01-28 NOTE — NC FL2 (Signed)
[place] MEDICAID FL2 LEVEL OF CARE FORM     IDENTIFICATION  Patient Name: [name] Birthdate: 02/07/1956 Sex: male Admission Date (Current Location): 01/27/2023  [facility] and [place] Number:  Producer, television/film/video and Address:  [facility], [address] [place], [place] [address]      Provider Number: [id number]  Attending Physician Name and Address:  System, Provider Not In  Relative Name and Phone Number:       Current Level of Care: Hospital Recommended Level of Care: Skilled Nursing Facility Prior Approval Number:    Date Approved/Denied:   PASRR Number: [id number]  Discharge Plan: SNF    Current Diagnoses: Patient Active Problem List   Diagnosis Date Noted   Dementia with psychotic disturbance (HCC) 01/28/2023   PAD (peripheral artery disease) (HCC) 02/08/2022   Increased frequency of urination 03/24/2021   Major neurocognitive disorder 03/24/2021   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   Fatty liver    Hardening of the aorta (main artery of the heart)    Insomnia    Pain in right hip    Personality disorder    Pure hypercholesterolemia    Renal cyst    Shoulder joint pain, right    Male erectile dysfunction 12/21/2019   Benign prostatic hyperplasia without lower urinary tract symptoms 02/05/2018    Orientation RESPIRATION BLADDER Height & Weight     Self  Normal Continent Weight: 182 lb (82.6 kg) Height:  5\' 6"  (167.6 cm)  BEHAVIORAL SYMPTOMS/MOOD NEUROLOGICAL BOWEL NUTRITION STATUS      Continent Diet (See discharge summary)  AMBULATORY STATUS COMMUNICATION OF NEEDS Skin   Extensive Assist Verbally Normal                       Personal Care Assistance Level of Assistance  Bathing, Feeding, Dressing Bathing Assistance: Limited assistance Feeding assistance: Limited assistance Dressing Assistance: Limited assistance     Functional Limitations Info  Sight, Hearing, Speech Sight Info:  Adequate Hearing Info: Adequate Speech Info: Adequate    SPECIAL CARE FACTORS FREQUENCY  PT (By licensed PT), OT (By licensed OT)     PT Frequency: 5x weekly OT Frequency: 5x weekly            Contractures Contractures Info: Not present    Additional Factors Info  Code Status, Allergies Code Status Info: Full Code Allergies Info: No known allergies           Current Medications (01/28/2023):  This is the current hospital active medication list Current Facility-Administered Medications  Medication Dose Route Frequency Provider Last Rate Last Admin   memantine (NAMENDA) tablet 10 mg  10 mg Oral BID Barrett, Jamie N, PA-C   10 mg at 01/28/23 1156   QUEtiapine (SEROQUEL) tablet 25 mg  25 mg Oral QHS Barrett, Jamie N, PA-C   25 mg at 01/27/23 2352   rivastigmine (EXELON) capsule 1.5 mg  1.5 mg Oral BID Barrett, Jamie N, PA-C   1.5 mg at 01/28/23 1156   Current Outpatient Medications  Medication Sig Dispense Refill   acetaminophen (TYLENOL) 325 MG tablet 1 tablet as needed     diclofenac Sodium (VOLTAREN) 1 % GEL Apply 2 g topically 4 (four) times daily. Rub into affected area of foot 2 to 4 times daily 100 g 2   ketoconazole (NIZORAL) 2 % cream APPLY TO AFFECTED AREA TWICE A DAY 15 g 2   memantine (NAMENDA) 10 MG tablet TAKE 1 TABLET  BY MOUTH TWICE A DAY 180 tablet 1   mirabegron ER (MYRBETRIQ) 25 MG TB24 tablet Take 1 tablet (25 mg total) by mouth 2 (two) times daily. 30 tablet 3   QUEtiapine (SEROQUEL) 25 MG tablet TAKE 1 TABLET BY MOUTH EVERYDAY AT BEDTIME 90 tablet 1   rivastigmine (EXELON) 1.5 MG capsule 1 capsules bid 180 capsule 3     Discharge Medications: Please see discharge summary for a list of discharge medications.  Relevant Imaging Results:  Relevant Lab Results:   Additional Information SSN: [id number]  Inis Sizer, LCSW

## 2023-01-28 NOTE — ED Notes (Addendum)
Pt resting comfortably at this time w/ wife at bedside

## 2023-01-29 ENCOUNTER — Emergency Department (HOSPITAL_COMMUNITY): Payer: Medicare HMO

## 2023-01-29 DIAGNOSIS — R4182 Altered mental status, unspecified: Secondary | ICD-10-CM | POA: Diagnosis not present

## 2023-01-29 DIAGNOSIS — R109 Unspecified abdominal pain: Secondary | ICD-10-CM | POA: Diagnosis not present

## 2023-01-29 DIAGNOSIS — F0390 Unspecified dementia without behavioral disturbance: Secondary | ICD-10-CM | POA: Diagnosis not present

## 2023-01-29 DIAGNOSIS — N281 Cyst of kidney, acquired: Secondary | ICD-10-CM | POA: Diagnosis not present

## 2023-01-29 DIAGNOSIS — K449 Diaphragmatic hernia without obstruction or gangrene: Secondary | ICD-10-CM | POA: Diagnosis not present

## 2023-01-29 MED ORDER — IOHEXOL 350 MG/ML SOLN
75.0000 mL | Freq: Once | INTRAVENOUS | Status: AC | PRN
  Administered 2023-01-29: 75 mL via INTRAVENOUS

## 2023-01-29 MED ORDER — TAMSULOSIN HCL 0.4 MG PO CAPS
0.4000 mg | ORAL_CAPSULE | Freq: Every day | ORAL | Status: DC
  Administered 2023-01-29: 0.4 mg via ORAL
  Filled 2023-01-29: qty 1

## 2023-01-29 MED ORDER — POLYETHYLENE GLYCOL 3350 17 G PO PACK
17.0000 g | PACK | Freq: Every day | ORAL | Status: DC
  Administered 2023-01-29 – 2023-01-30 (×2): 17 g via ORAL
  Filled 2023-01-29: qty 1

## 2023-01-29 MED ORDER — ROSUVASTATIN CALCIUM 5 MG PO TABS
10.0000 mg | ORAL_TABLET | Freq: Every day | ORAL | Status: DC
  Administered 2023-01-29 – 2023-01-30 (×2): 10 mg via ORAL
  Filled 2023-01-29 (×2): qty 2

## 2023-01-29 MED ORDER — IRBESARTAN 300 MG PO TABS
150.0000 mg | ORAL_TABLET | Freq: Every day | ORAL | Status: DC
  Administered 2023-01-29 – 2023-01-30 (×2): 150 mg via ORAL
  Filled 2023-01-29 (×2): qty 1

## 2023-01-29 NOTE — Evaluation (Signed)
Occupational Therapy Evaluation Patient Details Name: [name] MRN: [id number] DOB: 06/10/1955 Today's Date: 01/29/2023   History of Present Illness Pt is 67 year old presented to [facility] on  01/27/23 for AMS. PMH - major neurocognitive disorder, primary progressive aphasia, PAD   Clinical Impression   Patient admitted for above and presents with problem list below.  Pt using RW for mobility at home, needing some assist for ADLs. Today, he requires min assist for transfers and mobility using RW, mod assist for bed mobility, and min to total assist for ADLs.  Spouse and daughter at side.  Pt requires redirection throughout session, easily distracted and requires cueing for safety.  Based cognitive deficits and decline over the past several months needing increased assistance.  Based on performance today, believe patient will best benefit from continued OT services acutely and after dc at inpatient setting with <3hrs/day to decreased burden of care with ADLs and mobility.      If plan is discharge home, recommend the following: A lot of help with walking and/or transfers;A lot of help with bathing/dressing/bathroom;Supervision due to cognitive status;Direct supervision/assist for medications management;Direct supervision/assist for financial management;Assistance with cooking/housework    Functional Status Assessment  Patient has had a recent decline in their functional status and demonstrates the ability to make significant improvements in function in a reasonable and predictable amount of time.  Equipment Recommendations  Other (comment) (defer)    Recommendations for Other Services       Precautions / Restrictions Precautions Precautions: Fall Restrictions Weight Bearing Restrictions: No      Mobility Bed Mobility Overal bed mobility: Needs Assistance Bed Mobility: Supine to Sit, Sit to Supine     Supine to sit: Mod assist Sit to supine: Min assist   General bed  mobility comments: pt able to initate and move towards EOB but requires assist to steady when scooting foward, posterior lean; returned back to bed with increased time and trunk support to straigthen in bed    Transfers Overall transfer level: Needs assistance Equipment used: Rolling walker (2 wheels) Transfers: Sit to/from Stand Sit to Stand: Min assist           General transfer comment: from EOB with min assist to power up and stead y      Balance Overall balance assessment: Needs assistance Sitting-balance support: Bilateral upper extremity supported, Feet supported Sitting balance-Leahy Scale: Poor Sitting balance - Comments: posterior lean dynamically or when distracted Postural control: Posterior lean Standing balance support: Bilateral upper extremity supported, During functional activity Standing balance-Leahy Scale: Poor Standing balance comment: walker and min assist for static standing                           ADL either performed or assessed with clinical judgement   ADL Overall ADL's : Needs assistance/impaired     Grooming: Minimal assistance;Sitting           Upper Body Dressing : Moderate assistance;Sitting   Lower Body Dressing: Maximal assistance;Sit to/from stand   Toilet Transfer: Minimal assistance;Ambulation;Rolling walker (2 wheels)           Functional mobility during ADLs: Minimal assistance;Moderate assistance;Rolling walker (2 wheels)       Vision   Vision Assessment?: No apparent visual deficits     Perception         Praxis         Pertinent Vitals/Pain Pain Assessment Pain Assessment: No/denies pain  Extremity/Trunk Assessment Upper Extremity Assessment Upper Extremity Assessment: Generalized weakness   Lower Extremity Assessment Lower Extremity Assessment: Defer to PT evaluation       Communication Communication Communication: Difficulty following commands/understanding;Difficulty  communicating thoughts/reduced clarity of speech Following commands: Follows one step commands inconsistently;Follows one step commands with increased time Cueing Techniques: Gestural cues;Tactile cues;Verbal cues;Visual cues   Cognition Arousal: Alert Behavior During Therapy: Flat affect Overall Cognitive Status: History of cognitive impairments - at baseline                                 General Comments: easily distracted, folllows 1 step commands with increased time.     General Comments  family at side and supportive    Exercises     Shoulder Instructions      Home Living Family/patient expects to be discharged to:: Private residence Living Arrangements: Spouse/significant other Available Help at Discharge: Family;Personal care attendant Type of Home: House Home Access: Stairs to enter [employer] of Steps: 4-5 Entrance Stairs-Rails: Right;Left Home Layout: One level     Bathroom Shower/Tub: Sponge bathes at baseline   [employer]: Standard     Home Equipment: Agricultural consultant (2 wheels);Wheelchair - manual          Prior Functioning/Environment Prior Level of Function : Needs assist  Cognitive Assist : Mobility (cognitive);ADLs (cognitive) Mobility (Cognitive): Intermittent cues ADLs (Cognitive): Step by step cues Physical Assist : Mobility (physical);ADLs (physical) Mobility (physical): Bed mobility;Transfers;Gait;Stairs ADLs (physical): Bathing;Dressing;Toileting Mobility Comments: Pt amb short distances with walker. Pt needs assist at times ADLs Comments: spouse reports he can do some self care, but needs assist for most; pt self feeding and grooming        OT Problem List: Decreased strength;Decreased activity tolerance;Impaired balance (sitting and/or standing);Decreased cognition;Decreased knowledge of use of DME or AE;Decreased knowledge of precautions      OT Treatment/Interventions: Self-care/ADL training;Therapeutic  exercise;DME and/or AE instruction;Therapeutic activities;Cognitive remediation/compensation;Balance training;Patient/family education    OT Goals(Current goals can be found in the care plan section) Acute Rehab OT Goals Patient Stated Goal: none stated OT Goal Formulation: Patient unable to participate in goal setting Time For Goal Achievement: 02/12/23 Potential to Achieve Goals: Fair  OT Frequency: Min 1X/week    Co-evaluation              AM-PAC OT "6 Clicks" Daily Activity     Outcome Measure Help from another person eating meals?: A Little Help from another person taking care of personal grooming?: A Little Help from another person toileting, which includes using toliet, bedpan, or urinal?: A Lot Help from another person bathing (including washing, rinsing, drying)?: A Lot Help from another person to put on and taking off regular upper body clothing?: A Lot Help from another person to put on and taking off regular lower body clothing?: A Lot 6 Click Score: 14   End of Session Equipment Utilized During Treatment: Gait belt;Rolling walker (2 wheels) Nurse Communication: Mobility status  Activity Tolerance: Patient tolerated treatment well Patient left: in bed;with call bell/phone within reach;with bed alarm set;with family/visitor present  OT Visit Diagnosis: Other abnormalities of gait and mobility (R26.89);Muscle weakness (generalized) (M62.81);Other symptoms and signs involving cognitive function                Time: 2952-8413 OT Time Calculation (min): 18 min Charges:  OT General Charges $OT Visit: 1 Visit OT Evaluation $OT Eval Moderate Complexity:  1 Mod  Barry Brunner, OT Acute Rehabilitation Services Office [phone]   Chancy Milroy 01/29/2023, 11:50 AM

## 2023-01-29 NOTE — ED Provider Notes (Signed)
Emergency Medicine Observation Re-evaluation Note  [name] is a 67 y.o. male, seen on rounds today.  Pt initially presented to the ED for complaints of Altered Mental Status Currently, the patient is resting in bed with wife and daughter at bedside.  Physical Exam  BP (!) 131/93 (BP Location: Right Arm)   Pulse 77   Temp 97.8 F (36.6 C) (Oral)   Resp 18   Ht 5\' 6"  (1.676 m)   Wt 82.6 kg   SpO2 100%   BMI 29.38 kg/m  Physical Exam General: Awake, alert, nondistressed Cardiac: Extremities well-perfused Lungs: Breathing is unlabored Psych: No agitation  ED Course / MDM  EKG:EKG Interpretation Date/Time:  Friday January 27 2023 13:34:08 EST Ventricular Rate:  86 PR Interval:  144 QRS Duration:  94 QT Interval:  358 QTC Calculation: 428 R Axis:   -23  Text Interpretation: Normal sinus rhythm Moderate voltage criteria for LVH, may be normal variant ( R in aVL , Cornell product ) Borderline ECG When compared with ECG of 10-Apr-2010 17:38, PREVIOUS ECG IS PRESENT Confirmed by Gilda Crease [phone]) on 01/28/2023 10:10:45 AM  I have reviewed the labs performed to date as well as medications administered while in observation.  Recent changes in the last 24 hours include patient's wife states the patient was having severe abdominal pain yesterday.  This did seem to improve after a bowel movement.  He underwent CT imaging which did not show any acute findings.  Will provide daily bowel regimen.  Plan  Current plan is for placement.    Gloris Manchester, MD 01/29/23 [phone]

## 2023-01-29 NOTE — Progress Notes (Signed)
Patient has no bed offers at this time.  Edwin Dada, MSW, LCSW Transitions of Care  Clinical Social Worker II [phone]

## 2023-01-30 DIAGNOSIS — R4182 Altered mental status, unspecified: Secondary | ICD-10-CM | POA: Diagnosis not present

## 2023-01-30 DIAGNOSIS — F0390 Unspecified dementia without behavioral disturbance: Secondary | ICD-10-CM | POA: Diagnosis not present

## 2023-01-30 NOTE — Progress Notes (Addendum)
11:45am: CSW spoke with patient's wife Nelma Rothman who is agreeable for home health services for PT, OT, and aide. [name] states she will transport the patient home via private vehicle. All questions were answered.  CSW spoke with [place] at [facility] who states the agency can provide these services to patient. Frances Furbish will contact patient's wife directly to initiate services.  11:20am: CSW received message from RN stating patient's wife is at bedside and states she does not want him to go to [facility] instead wants to take him home with home health.  CSW requested [facility] PT, OT, and aide orders for home health.  8:30am: Patient still does not have any bed offers - only one facility has placed patient in "considering" status which is [facility].  CSW sent message to [place] in admissions at [facility] to determine if a firm bed offer can be made.  Edwin Dada, MSW, LCSW Transitions of Care  Clinical Social Worker II [phone]

## 2023-01-30 NOTE — Discharge Planning (Signed)
Licensed Clinical Social Worker is seeking post-discharge placement for this patient at the following level of care: skilled nursing facility    

## 2023-01-30 NOTE — ED Provider Notes (Signed)
Emergency Medicine Observation Re-evaluation Note  [name] is a 67 y.o. male, seen on rounds today.  Pt initially presented to the ED for complaints of Altered Mental Status Currently, the patient is resting in bed with wife and daughter at bedside.  Physical Exam  BP (!) 131/93 (BP Location: Right Arm)   Pulse 77   Temp 97.8 F (36.6 C) (Oral)   Resp 18   Ht 5\' 6"  (1.676 m)   Wt 82.6 kg   SpO2 100%   BMI 29.38 kg/m  Physical Exam General: Awake, alert, nondistressed Cardiac: Extremities well-perfused Lungs: Breathing is unlabored Psych: No agitation  ED Course / MDM  EKG:EKG Interpretation Date/Time:  Friday January 27 2023 13:34:08 EST Ventricular Rate:  86 PR Interval:  144 QRS Duration:  94 QT Interval:  358 QTC Calculation: 428 R Axis:   -23  Text Interpretation: Normal sinus rhythm Moderate voltage criteria for LVH, may be normal variant ( R in aVL , Cornell product ) Borderline ECG When compared with ECG of 10-Apr-2010 17:38, PREVIOUS ECG IS PRESENT Confirmed by Gilda Crease [phone]) on 01/28/2023 10:10:45 AM  I have reviewed the labs performed to date as well as medications administered while in observation.  Recent changes in the last 24 hours include patient's wife states the patient was having severe abdominal pain yesterday.  This did seem to improve after a bowel movement.  He underwent CT imaging which did not show any acute findings.  Will provide daily bowel regimen.  Plan  Current plan is for placement.      Melene Plan, DO 01/30/23 [phone]

## 2023-01-30 NOTE — Discharge Instructions (Signed)
Please let your family doctor know that you had to come to the emergency department for this.  They may want to see you in person or change her medications.

## 2023-01-30 NOTE — ED Notes (Signed)
Reviewed discharge instructions with wife. Follow-up care reviewed. Wife verbalized understanding. Patient A&Ox1, VSS upon discharge.

## 2023-02-07 ENCOUNTER — Other Ambulatory Visit: Payer: Self-pay

## 2023-02-07 ENCOUNTER — Emergency Department (HOSPITAL_COMMUNITY): Payer: Medicare HMO

## 2023-02-07 ENCOUNTER — Emergency Department (HOSPITAL_COMMUNITY)
Admission: EM | Admit: 2023-02-07 | Discharge: 2023-02-07 | Disposition: A | Discharge status: Home / Self Care | Payer: Medicare HMO | Attending: Emergency Medicine | Admitting: Emergency Medicine

## 2023-02-07 ENCOUNTER — Encounter (HOSPITAL_COMMUNITY): Payer: Self-pay

## 2023-02-07 DIAGNOSIS — R519 Headache, unspecified: Secondary | ICD-10-CM | POA: Diagnosis not present

## 2023-02-07 DIAGNOSIS — W19XXXA Unspecified fall, initial encounter: Secondary | ICD-10-CM | POA: Diagnosis not present

## 2023-02-07 DIAGNOSIS — I10 Essential (primary) hypertension: Secondary | ICD-10-CM | POA: Diagnosis not present

## 2023-02-07 DIAGNOSIS — G9389 Other specified disorders of brain: Secondary | ICD-10-CM | POA: Diagnosis not present

## 2023-02-07 DIAGNOSIS — I739 Peripheral vascular disease, unspecified: Secondary | ICD-10-CM | POA: Diagnosis not present

## 2023-02-07 DIAGNOSIS — W1789XA Other fall from one level to another, initial encounter: Secondary | ICD-10-CM | POA: Insufficient documentation

## 2023-02-07 DIAGNOSIS — F05 Delirium due to known physiological condition: Secondary | ICD-10-CM | POA: Diagnosis not present

## 2023-02-07 DIAGNOSIS — Y92480 Sidewalk as the place of occurrence of the external cause: Secondary | ICD-10-CM | POA: Diagnosis not present

## 2023-02-07 DIAGNOSIS — F0390 Unspecified dementia without behavioral disturbance: Secondary | ICD-10-CM | POA: Insufficient documentation

## 2023-02-07 DIAGNOSIS — S0990XA Unspecified injury of head, initial encounter: Secondary | ICD-10-CM | POA: Insufficient documentation

## 2023-02-07 DIAGNOSIS — G3101 Pick's disease: Secondary | ICD-10-CM | POA: Diagnosis not present

## 2023-02-07 DIAGNOSIS — Z9181 History of falling: Secondary | ICD-10-CM | POA: Diagnosis not present

## 2023-02-07 DIAGNOSIS — E785 Hyperlipidemia, unspecified: Secondary | ICD-10-CM | POA: Diagnosis not present

## 2023-02-07 DIAGNOSIS — F02818 Dementia in other diseases classified elsewhere, unspecified severity, with other behavioral disturbance: Secondary | ICD-10-CM | POA: Diagnosis not present

## 2023-02-07 NOTE — Discharge Instructions (Signed)
Please call your doctor's office to discuss further options for potential placement in memory care facility or rehab facility.  A consult order was placed for the ED social worker, who may contact you within the next 24 hours.  However, assistance with placement is very limited through an emergency room.

## 2023-02-07 NOTE — Progress Notes (Signed)
Transition of Care [facility]) - Emergency Department Mini Assessment   Patient Details  Name: [name] MRN: [id number] Date of Birth: [birth year]  Transition of Care [facility]) CM/SW Contact:    Princella Ion, LCSW Phone Number: 02/07/2023, 2:02 PM   Clinical Narrative: [facility] consulted for SNF placement. CSW spoke with pt's spouse who reported she is wanting a little more assistance for the pt due to [facility] only visiting a couple times a week. CSW explained SNF workup process and spouse noted pt was just in the ED "the other week and his only offer was [facility] but I didn't want him to go to [facility] because I've heard too much about them." CSW did inform pt that our team could initiate the SNF workup again; however, if pt did not receive bed offers or received offers that she did not prefer, he would return home with [facility]. Pt verbalized understanding and opted to not have pt remain in the ED for placement; instead wishes to take him home to continue [facility]. CSW offered to add SW services to [place] and spouse agreed. Spouse stated she may call preferred facilities on her own to see about bed availability and states she will also call the pt's PCP.   RNCM to assist with new orders.    ED Mini Assessment: What brought you to the Emergency Department? : fall  Barriers to Discharge: No Barriers Identified     Means of departure: Car       Patient Contact and Communications Key Contact 1: Lablanc,daryl (Spouse)  [phone]   Spoke with: Decesare,daryl (Spouse)  [phone] Contact Date: 02/07/23,   Contact time: 0150 Contact Phone Number: Walthall,daryl (Spouse)  [phone] Call outcome: Patient has opted to take pt home at this time with continued [facility]         Admission diagnosis:  Fall Patient Active Problem List   Diagnosis Date Noted   Dementia with psychotic disturbance (HCC) 01/28/2023   Tear of medial meniscus of knee 09/02/2022   PAD (peripheral artery  disease) (HCC) 02/08/2022   Increased frequency of urination 03/24/2021   Major neurocognitive disorder 03/24/2021   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   Fatty liver    Hardening of the aorta (main artery of the heart)    Insomnia    Pain in right hip    Personality disorder    Pure hypercholesterolemia    Renal cyst    Shoulder joint pain, right    Male erectile dysfunction 12/21/2019   Benign prostatic hyperplasia without lower urinary tract symptoms 02/05/2018   PCP:  Irven Coe, MD Pharmacy:   [phone] [place] Otto, [place] - [address] AT [facility] [address] [place] [address] Phone: [phone] Fax: [phone]

## 2023-02-07 NOTE — ED Provider Notes (Signed)
[place] EMERGENCY DEPARTMENT AT [facility] Provider Note   CSN: [id number] Arrival date & time: 02/07/23  1127     History  Chief Complaint  Patient presents with   [name] is a 67 y.o. male with a history of dementia presenting to the ED with a fall.  His wife accompanies him.  She reports he lost his balance getting out of car and fell striking his left side and his left side on the pavement.  He is not on blood thinners.  He typically walks with a walker but sometimes refuses to.  Patient was seen in the ED approximately 1 week ago, from December 6 through December 9, evaluated for behavioral disturbances as well as fall risk.  He had a UA unremarkable and basic labs are unremarkable at the time.  He was evaluated by behavioral health who felt that his behavioral changes were likely related to dementia, and were not recommending inpatient psychiatric evaluation.  He also had a TOC team evaluation for SNF placement, ultimately the patient was unable to be accepted anywhere, and his wife had opted for home health.  She reports she does have a CNA who comes in the daytime, but is concerned about the home not being "handicap accessible" to the patient.  HPI     Home Medications Prior to Admission medications   Medication Sig Start Date End Date Taking? Authorizing Provider  acetaminophen (TYLENOL) 325 MG tablet Take 650 mg by mouth daily as needed for mild pain (pain score 1-3).    [provider]  escitalopram (LEXAPRO) 10 MG tablet Take 10 mg by mouth daily. 01/16/23   [provider]  memantine (NAMENDA) 10 MG tablet TAKE 1 TABLET BY MOUTH TWICE A DAY 08/17/22   Gwynneth Munson, Sung Amabile, PA-C  olmesartan (BENICAR) 20 MG tablet Take 20 mg by mouth daily. 11/28/22   [provider]  QUEtiapine (SEROQUEL) 25 MG tablet TAKE 1 TABLET BY MOUTH EVERYDAY AT BEDTIME 10/14/22   Marcos Eke, PA-C  rivastigmine (EXELON) 1.5 MG capsule 1  capsules bid Patient taking differently: Take 1.5 mg by mouth 2 (two) times daily. 07/26/22   Marcos Eke, PA-C  rosuvastatin (CRESTOR) 10 MG tablet Take 10 mg by mouth daily. 01/12/23   [provider]  tamsulosin (FLOMAX) 0.4 MG CAPS capsule Take 0.4 mg by mouth at bedtime.    [provider]      Allergies    Patient has no known allergies.    Review of Systems   Review of Systems  Physical Exam Updated Vital Signs BP (!) 148/85   Pulse 71   Temp 98.3 F (36.8 C) (Oral)   Resp 16   Ht 5\' 6"  (1.676 m)   Wt 83 kg   SpO2 96%   BMI 29.53 kg/m  Physical Exam Constitutional:      General: He is not in acute distress. HENT:     Head: Normocephalic and atraumatic.  Eyes:     Conjunctiva/sclera: Conjunctivae normal.     Pupils: Pupils are equal, round, and reactive to light.  Cardiovascular:     Rate and Rhythm: Normal rate and regular rhythm.  Pulmonary:     Effort: Pulmonary effort is normal. No respiratory distress.  Abdominal:     General: There is no distension.     Tenderness: There is no abdominal tenderness.  Musculoskeletal:     Comments: Full range of motion of all joints without  evidence of pain or contusion or bruising  Skin:    General: Skin is warm and dry.  Neurological:     General: No focal deficit present.     Mental Status: He is alert and oriented to person, place, and time. Mental status is at baseline.     ED Results / Procedures / Treatments   Labs (all labs ordered are listed, but only abnormal results are displayed) Labs Reviewed - No data to display  EKG EKG Interpretation Date/Time:  Tuesday February 07 2023 11:59:24 EST Ventricular Rate:  77 PR Interval:  144 QRS Duration:  93 QT Interval:  377 QTC Calculation: 427 R Axis:   -26  Text Interpretation: Sinus rhythm Abnormal R-wave progression, late transition Left ventricular hypertrophy Confirmed by Alvester Chou [phone]) on 02/07/2023 12:31:47  PM  Radiology CT Head Wo Contrast Result Date: 02/07/2023 CLINICAL DATA:  Left-sided head pain after falling onto concrete EXAM: CT HEAD WITHOUT CONTRAST TECHNIQUE: Contiguous axial images were obtained from the base of the skull through the vertex without intravenous contrast. RADIATION DOSE REDUCTION: This exam was performed according to the departmental dose-optimization program which includes automated exposure control, adjustment of the mA and/or kV according to patient size and/or use of iterative reconstruction technique. COMPARISON:  01/27/2023 FINDINGS: Brain: No evidence of acute infarction, hemorrhage, mass, mass effect, or midline shift. No hydrocephalus or extra-axial fluid collection. Unchanged size and configuration of the ventricles, which remain enlarged, with particular dilatation the left greater than right temporal horn; this is most likely the sequela temporal lobe predominant cerebral atrophy. Vascular: No hyperdense vessel. Skull: Negative for fracture or focal lesion. Sinuses/Orbits: No acute finding. Other: The mastoid air cells are well aerated. IMPRESSION: No acute intracranial process. Electronically Signed   By: Wiliam Ke M.D.   On: 02/07/2023 13:20    Procedures Procedures    Medications Ordered in ED Medications - No data to display  ED Course/ Medical Decision Making/ A&P                                 Medical Decision Making Amount and/or Complexity of Data Reviewed Radiology: ordered.   The patient presenting after reported mechanical fall earlier today witnessed by his wife.  Symptom on his provided by his wife.  The patient has no evidence of trauma on exam.  Low suspicion for fracture or injury of the pelvis or extremities.  I think a CT scan of the head is reasonable given his dementia and reported head injury, and I personally reviewed interpret this, notable for no emergent findings  Separately his wife has some concerns about their living  situation.  She is actively looking for a memory care facility but currently is not able to afford having them placed in 1 full-time.  I understand this is a difficult issue for them.  He does have home health at this time but the house may not be fully handicap accessible, and they cannot afford to have it made handicap accessible.  Unfortunately, we were not able to place the patient into rehab on his prior visit to the ED, and so I am encouraging her to reach out to the primary care physician's office to see if there are additional resources or programs that might be available as an outpatient.  He does not meet inpatient hospitalization criteria at this time.  The patient's wife did request that a Child psychotherapist contact her  again from the ED or the hospital so that she could discuss outpatient options.  She understands that we have very limited resources to actually assist with placement, but the social worker might be more knowledgeable about facilities in this area for her to apply to.        Final Clinical Impression(s) / ED Diagnoses Final diagnoses:  Fall, initial encounter  Injury of head, initial encounter    Rx / DC Orders ED Discharge Orders     None         Savita Runner, Kermit Balo, MD 02/07/23 1347

## 2023-02-07 NOTE — Care Management (Signed)
[facility] ED SW advised patient needs additional HH services. This RNCM verified with Cindie w/[facility] that patient has the following services prior to admission: HHPT/OT, HHA. This RNCM notified EDP, updated HH orders.    No additional TOC needs

## 2023-02-07 NOTE — ED Triage Notes (Signed)
Patient BIB [facility] from home. Patient tried to get out of car by himself, lost his balance. Has dementia. Complaining of left sided head pain after falling onto concrete. No blood thinners.

## 2023-02-12 ENCOUNTER — Other Ambulatory Visit: Payer: Self-pay | Admitting: Physician Assistant

## 2023-02-16 DIAGNOSIS — I739 Peripheral vascular disease, unspecified: Secondary | ICD-10-CM | POA: Diagnosis not present

## 2023-02-16 DIAGNOSIS — E785 Hyperlipidemia, unspecified: Secondary | ICD-10-CM | POA: Diagnosis not present

## 2023-02-16 DIAGNOSIS — Z9181 History of falling: Secondary | ICD-10-CM | POA: Diagnosis not present

## 2023-02-16 DIAGNOSIS — F05 Delirium due to known physiological condition: Secondary | ICD-10-CM | POA: Diagnosis not present

## 2023-02-16 DIAGNOSIS — F02818 Dementia in other diseases classified elsewhere, unspecified severity, with other behavioral disturbance: Secondary | ICD-10-CM | POA: Diagnosis not present

## 2023-02-16 DIAGNOSIS — G3101 Pick's disease: Secondary | ICD-10-CM | POA: Diagnosis not present

## 2023-02-21 DIAGNOSIS — F05 Delirium due to known physiological condition: Secondary | ICD-10-CM | POA: Diagnosis not present

## 2023-02-21 DIAGNOSIS — E785 Hyperlipidemia, unspecified: Secondary | ICD-10-CM | POA: Diagnosis not present

## 2023-02-21 DIAGNOSIS — F02818 Dementia in other diseases classified elsewhere, unspecified severity, with other behavioral disturbance: Secondary | ICD-10-CM | POA: Diagnosis not present

## 2023-02-21 DIAGNOSIS — G3101 Pick's disease: Secondary | ICD-10-CM | POA: Diagnosis not present

## 2023-02-21 DIAGNOSIS — I739 Peripheral vascular disease, unspecified: Secondary | ICD-10-CM | POA: Diagnosis not present

## 2023-02-21 DIAGNOSIS — Z9181 History of falling: Secondary | ICD-10-CM | POA: Diagnosis not present

## 2023-02-23 ENCOUNTER — Emergency Department (HOSPITAL_COMMUNITY): Payer: Medicare HMO

## 2023-02-23 ENCOUNTER — Other Ambulatory Visit: Payer: Self-pay

## 2023-02-23 ENCOUNTER — Emergency Department (HOSPITAL_COMMUNITY)
Admission: EM | Admit: 2023-02-23 | Discharge: 2023-02-23 | Disposition: A | Discharge status: Home / Self Care | Payer: Medicare HMO | Attending: Emergency Medicine | Admitting: Emergency Medicine

## 2023-02-23 DIAGNOSIS — S0003XA Contusion of scalp, initial encounter: Secondary | ICD-10-CM | POA: Diagnosis not present

## 2023-02-23 DIAGNOSIS — R519 Headache, unspecified: Secondary | ICD-10-CM | POA: Diagnosis not present

## 2023-02-23 DIAGNOSIS — W19XXXA Unspecified fall, initial encounter: Secondary | ICD-10-CM

## 2023-02-23 DIAGNOSIS — Y92002 Bathroom of unspecified non-institutional (private) residence single-family (private) house as the place of occurrence of the external cause: Secondary | ICD-10-CM | POA: Diagnosis not present

## 2023-02-23 DIAGNOSIS — M4802 Spinal stenosis, cervical region: Secondary | ICD-10-CM | POA: Diagnosis not present

## 2023-02-23 DIAGNOSIS — S199XXA Unspecified injury of neck, initial encounter: Secondary | ICD-10-CM | POA: Diagnosis not present

## 2023-02-23 DIAGNOSIS — Z79899 Other long term (current) drug therapy: Secondary | ICD-10-CM | POA: Insufficient documentation

## 2023-02-23 DIAGNOSIS — G9389 Other specified disorders of brain: Secondary | ICD-10-CM | POA: Diagnosis not present

## 2023-02-23 DIAGNOSIS — F0390 Unspecified dementia without behavioral disturbance: Secondary | ICD-10-CM | POA: Insufficient documentation

## 2023-02-23 DIAGNOSIS — I6782 Cerebral ischemia: Secondary | ICD-10-CM | POA: Diagnosis not present

## 2023-02-23 DIAGNOSIS — I10 Essential (primary) hypertension: Secondary | ICD-10-CM | POA: Diagnosis not present

## 2023-02-23 DIAGNOSIS — W0110XA Fall on same level from slipping, tripping and stumbling with subsequent striking against unspecified object, initial encounter: Secondary | ICD-10-CM | POA: Insufficient documentation

## 2023-02-23 DIAGNOSIS — S8001XA Contusion of right knee, initial encounter: Secondary | ICD-10-CM | POA: Insufficient documentation

## 2023-02-23 DIAGNOSIS — M47812 Spondylosis without myelopathy or radiculopathy, cervical region: Secondary | ICD-10-CM | POA: Diagnosis not present

## 2023-02-23 DIAGNOSIS — M5021 Other cervical disc displacement,  high cervical region: Secondary | ICD-10-CM | POA: Diagnosis not present

## 2023-02-23 DIAGNOSIS — Z043 Encounter for examination and observation following other accident: Secondary | ICD-10-CM | POA: Diagnosis not present

## 2023-02-23 DIAGNOSIS — S0990XA Unspecified injury of head, initial encounter: Secondary | ICD-10-CM | POA: Diagnosis present

## 2023-02-23 DIAGNOSIS — Y9301 Activity, walking, marching and hiking: Secondary | ICD-10-CM | POA: Diagnosis not present

## 2023-02-23 MED ORDER — ACETAMINOPHEN 500 MG PO TABS
1000.0000 mg | ORAL_TABLET | Freq: Once | ORAL | Status: AC
  Administered 2023-02-23: 1000 mg via ORAL
  Filled 2023-02-23: qty 2

## 2023-02-23 NOTE — ED Notes (Signed)
 Patient was able to ambulate with a walker. Minimal assistance was needed. He tolerated ambulation well.

## 2023-02-23 NOTE — ED Triage Notes (Signed)
 Pt BIBA from home. Pt had mech fall, no LOC, not on blood thinners. Original complaint of head pain, but pt states this has gone away.  Mild dementia, at baseline

## 2023-02-23 NOTE — Discharge Instructions (Addendum)
 Evaluation today was overall reassuring.  Please follow-up with your PCP.  If your symptoms worsen anyway please return to the emergency department further evaluation.

## 2023-02-23 NOTE — ED Provider Triage Note (Signed)
 Emergency Medicine Provider Triage Evaluation Note  Damion Kant , a 68 y.o. male  was evaluated in triage.  Pt complains of Fall.  Review of Systems  Positive:  Negative:   Physical Exam  There were no vitals taken for this visit. Gen:   Awake, no distress   Resp:  Normal effort  MSK:   Moves extremities without difficulty  Other:    Medical Decision Making  Medically screening exam initiated at 2:35 PM.  Appropriate orders placed.  Brockton Mckesson was informed that the remainder of the evaluation will be completed by another provider, this initial triage assessment does not replace that evaluation, and the importance of remaining in the ED until their evaluation is complete.  Daughter at bedside providing hx. Patient with dementia. Patient has sitter at home. Patient fell and hit his head in the bathroom today. Currently endorsing headache. Denies any other pain anywhere else.   Hoy Nidia FALCON, [place] 02/23/23 1445

## 2023-02-23 NOTE — ED Provider Notes (Signed)
 [place] EMERGENCY DEPARTMENT AT [facility] Provider Note   CSN: [id number] Arrival date & time: 02/23/23  1426     History  Chief Complaint  Patient presents with   Fall   HPI [name] is a 68 y.o. male with history of dementia, PAD presenting for fall.  Occurred about 2-1/2 hours ago.  Per his daughter he was walking with his walker tripped and fell fell to the side hitting the right side of his head and right knee.  Denies LOC Patient not on a blood thinner.  Patient states that the right posterior aspect of his scalp hurt but otherwise has no medical complaints at this time.  His daughter also reports expressive aphasia at baseline due to advancement of his dementia.   Fall       Home Medications Prior to Admission medications   Medication Sig Start Date End Date Taking? Authorizing Provider  acetaminophen  (TYLENOL ) 325 MG tablet Take 650 mg by mouth daily as needed for mild pain (pain score 1-3).    [provider]  escitalopram (LEXAPRO) 10 MG tablet Take 10 mg by mouth daily. 01/16/23   [provider]  memantine  (NAMENDA ) 10 MG tablet TAKE 1 TABLET BY MOUTH TWICE A DAY 02/13/23   Dina, Sara E, PA-C  olmesartan (BENICAR) 20 MG tablet Take 20 mg by mouth daily. 11/28/22   [provider]  QUEtiapine  (SEROQUEL ) 25 MG tablet TAKE 1 TABLET BY MOUTH EVERYDAY AT BEDTIME 10/14/22   Wertman, Sara E, PA-C  rivastigmine  (EXELON ) 1.5 MG capsule 1 capsules bid Patient taking differently: Take 1.5 mg by mouth 2 (two) times daily. 07/26/22   Wertman, Sara E, PA-C  rosuvastatin  (CRESTOR ) 10 MG tablet Take 10 mg by mouth daily. 01/12/23   [provider]  tamsulosin  (FLOMAX ) 0.4 MG CAPS capsule Take 0.4 mg by mouth at bedtime.    [provider]      Allergies    Patient has no known allergies.    Review of Systems   See HPI   Physical Exam Updated Vital Signs BP (!) 148/89 (BP Location: Right Arm)   Pulse 78    Temp 98.4 F (36.9 C) (Oral)   Resp 18   Ht 5' 6 (1.676 m)   Wt 83 kg   SpO2 98%   BMI 29.53 kg/m  Physical Exam Vitals and nursing note reviewed.  HENT:     Head: Normocephalic and atraumatic. No raccoon eyes or Battle's sign.     Nose: No rhinorrhea.     Mouth/Throat:     Mouth: Mucous membranes are moist.  Eyes:     General:        Right eye: No discharge.        Left eye: No discharge.     Conjunctiva/sclera: Conjunctivae normal.  Cardiovascular:     Rate and Rhythm: Normal rate and regular rhythm.     Pulses: Normal pulses.     Heart sounds: Normal heart sounds.  Pulmonary:     Effort: Pulmonary effort is normal.     Breath sounds: Normal breath sounds.  Abdominal:     General: Abdomen is flat.     Palpations: Abdomen is soft.  Skin:    General: Skin is warm and dry.  Neurological:     General: No focal deficit present.     Comments: GCS 15. Speech is goal oriented. No deficits appreciated to CN III-XII; symmetric eyebrow raise, no facial drooping, tongue midline.  Patient has equal grip strength bilaterally with 5/5 strength against resistance in all major muscle groups bilaterally. Sensation to light touch intact. Patient moves extremities without ataxia. Normal finger-nose-finger.    Psychiatric:        Mood and Affect: Mood normal. Affect is flat.     ED Results / Procedures / Treatments   Labs (all labs ordered are listed, but only abnormal results are displayed) Labs Reviewed - No data to display  EKG None  Radiology DG Knee Complete 4 Views Right Result Date: 02/23/2023 CLINICAL DATA:  Fall EXAM: RIGHT KNEE - COMPLETE 4+ VIEW COMPARISON:  10/18/2021 FINDINGS: No fracture or malalignment. No significant knee effusion. Mild medial and patellofemoral degenerative change IMPRESSION: No acute osseous abnormality. Electronically Signed   By: Luke Bun M.D.   On: 02/23/2023 18:16   CT Cervical Spine Wo Contrast Result Date: 02/23/2023 CLINICAL DATA:   Trauma head pain EXAM: CT CERVICAL SPINE WITHOUT CONTRAST TECHNIQUE: Multidetector CT imaging of the cervical spine was performed without intravenous contrast. Multiplanar CT image reconstructions were also generated. RADIATION DOSE REDUCTION: This exam was performed according to the departmental dose-optimization program which includes automated exposure control, adjustment of the mA and/or kV according to patient size and/or use of iterative reconstruction technique. COMPARISON:  None Available. FINDINGS: Alignment: Trace retrolisthesis C3 on C4 and C4 on C5. Facet alignment is within normal limits Skull base and vertebrae: No acute fracture. No primary bone lesion or focal pathologic process. Soft tissues and spinal canal: No prevertebral fluid or swelling. No visible canal hematoma. Disc levels: Multilevel degenerative changes. Advanced disc space narrowing C3-C4, C4-C5, C5-C6 and C6-C7. Facet degenerative changes at multiple levels. Upper chest: Apical scarring Other: None IMPRESSION: Degenerative changes without acute osseous abnormality. Electronically Signed   By: Luke Bun M.D.   On: 02/23/2023 18:15   CT Head Wo Contrast Result Date: 02/23/2023 CLINICAL DATA:  Mechanical fall head pain EXAM: CT HEAD WITHOUT CONTRAST TECHNIQUE: Contiguous axial images were obtained from the base of the skull through the vertex without intravenous contrast. RADIATION DOSE REDUCTION: This exam was performed according to the departmental dose-optimization program which includes automated exposure control, adjustment of the mA and/or kV according to patient size and/or use of iterative reconstruction technique. COMPARISON:  CT brain 02/07/2023, MRI 09/17/2021 FINDINGS: Brain: No acute territorial infarction, hemorrhage or intracranial mass. Stable ventricular enlargement. Mild white matter hypodensity likely chronic small vessel ischemic change. Prominent temporal lobe atrophy as before. Vascular: No hyperdense vessels.   No unexpected calcification Skull: Normal. Negative for fracture or focal lesion. Sinuses/Orbits: No acute finding. Other: None IMPRESSION: 1. No CT evidence for acute intracranial abnormality. 2. Mild chronic small vessel ischemic changes of the white matter. Similar temporal lobe predominant atrophy and ventricular enlargement. Electronically Signed   By: Luke Bun M.D.   On: 02/23/2023 18:11    Procedures Procedures    Medications Ordered in ED Medications  acetaminophen  (TYLENOL ) tablet 1,000 mg (1,000 mg Oral Given 02/23/23 1630)    ED Course/ Medical Decision Making/ A&P                                 Medical Decision Making Amount and/or Complexity of Data Reviewed Radiology: ordered.  Risk OTC drugs.   68 year old well-appearing male presenting for mechanical fall.  Exam was unremarkable.  DDx includes traumatic injury to the head and neck and right knee.  X-rays and CT  scans were negative for acute abnormalities.  On reassessment patient stated that symptoms had improved overall with treatment with Tylenol .  Also able to ambulate with a walker to and from the bathroom and patient did very well without any complications.  Advised him to follow-up with his PCP.  Discussed return precautions.  Vital stable.  Discharged in good condition.        Final Clinical Impression(s) / ED Diagnoses Final diagnoses:  Fall, initial encounter    Rx / DC Orders ED Discharge Orders     None         Lang Norleen POUR, PA-C 02/23/23 1927    Bernard Drivers, MD 02/27/23 1422

## 2023-02-24 DIAGNOSIS — E7800 Pure hypercholesterolemia, unspecified: Secondary | ICD-10-CM | POA: Diagnosis not present

## 2023-02-24 DIAGNOSIS — I700 Atherosclerosis of aorta: Secondary | ICD-10-CM | POA: Diagnosis not present

## 2023-02-24 DIAGNOSIS — G3101 Pick's disease: Secondary | ICD-10-CM | POA: Diagnosis not present

## 2023-02-24 DIAGNOSIS — F0150 Vascular dementia without behavioral disturbance: Secondary | ICD-10-CM | POA: Diagnosis not present

## 2023-02-24 DIAGNOSIS — I10 Essential (primary) hypertension: Secondary | ICD-10-CM | POA: Diagnosis not present

## 2023-02-24 DIAGNOSIS — N400 Enlarged prostate without lower urinary tract symptoms: Secondary | ICD-10-CM | POA: Diagnosis not present

## 2023-02-24 DIAGNOSIS — R1031 Right lower quadrant pain: Secondary | ICD-10-CM | POA: Diagnosis not present

## 2023-02-24 DIAGNOSIS — Z111 Encounter for screening for respiratory tuberculosis: Secondary | ICD-10-CM | POA: Diagnosis not present

## 2023-02-24 DIAGNOSIS — K760 Fatty (change of) liver, not elsewhere classified: Secondary | ICD-10-CM | POA: Diagnosis not present

## 2023-02-24 DIAGNOSIS — Z022 Encounter for examination for admission to residential institution: Secondary | ICD-10-CM | POA: Diagnosis not present

## 2023-02-24 DIAGNOSIS — G8190 Hemiplegia, unspecified affecting unspecified side: Secondary | ICD-10-CM | POA: Diagnosis not present

## 2023-02-27 ENCOUNTER — Encounter: Payer: Self-pay | Admitting: Physician Assistant

## 2023-02-27 ENCOUNTER — Ambulatory Visit: Payer: Medicare HMO | Admitting: Physician Assistant

## 2023-02-27 DIAGNOSIS — Z029 Encounter for administrative examinations, unspecified: Secondary | ICD-10-CM
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
## 2023-03-01 ENCOUNTER — Encounter: Payer: Self-pay | Admitting: Physician Assistant

## 2023-03-01 ENCOUNTER — Ambulatory Visit: Payer: Medicare HMO | Admitting: Physician Assistant

## 2023-03-01 VITALS — BP 120/80 | HR 92 | Resp 20 | Ht 66.0 in

## 2023-03-01 DIAGNOSIS — F0280 Dementia in other diseases classified elsewhere without behavioral disturbance: Secondary | ICD-10-CM | POA: Diagnosis not present

## 2023-03-01 DIAGNOSIS — G3101 Pick's disease: Secondary | ICD-10-CM

## 2023-03-01 DIAGNOSIS — F0390 Unspecified dementia without behavioral disturbance: Secondary | ICD-10-CM

## 2023-03-01 NOTE — Patient Instructions (Signed)
 It was a pleasure to see you today at our office.   Recommendations:  Follow up in 6 months  Discontinue  rivastigmine  to 1.5  mg twice daily  Discontinue Memantine  10 mg 2 times a day   Continue Seroquel  25 mg at night for mood    Whom to call:  Memory  decline, memory medications: Call our office [phone]   For psychiatric meds, mood meds: Please have your primary care physician manage these medications.   Counseling regarding caregiver distress, including caregiver depression, anxiety and issues regarding community resources, adult day care programs, adult living facilities, or memory care questions:   Feel free to contact Misty Waddell Simmer, Social Worker at [phone]   For assessment of decision of mental capacity and competency:  Call Dr. Rosaline Nine, geriatric psychiatrist at [phone]  For guidance in geriatric dementia issues please call [facility] [phone]  For guidance regarding [facility] and if placement were needed at the facility, contact Nat Hock, Social Worker tel: [phone]  If you have any severe symptoms of a stroke, or other severe issues such as confusion,severe chills or fever, etc call 911 or go to the ER     RECOMMENDATIONS FOR ALL PATIENTS WITH MEMORY PROBLEMS: 1. Continue to exercise (Recommend 30 minutes of walking everyday, or 3 hours every week) 2. Increase social interactions - continue going to [place] and enjoy social gatherings with friends and family 3. Eat healthy, avoid fried foods and eat more fruits and vegetables 4. Maintain adequate blood pressure, blood sugar, and blood cholesterol level. Reducing the risk of stroke and cardiovascular disease also helps promoting better memory. 5. Avoid stressful situations. Live a simple life and avoid aggravations. Organize your time and prepare for the next day in anticipation. 6. Sleep well, avoid any interruptions of sleep and avoid any  distractions in the bedroom that may interfere with adequate sleep quality 7. Avoid sugar, avoid sweets as there is a strong link between excessive sugar intake, diabetes, and cognitive impairment We discussed the Mediterranean diet, which has been shown to help patients reduce the risk of progressive memory disorders and reduces cardiovascular risk. This includes eating fish, eat fruits and green leafy vegetables, nuts like almonds and hazelnuts, walnuts, and also use olive oil. Avoid fast foods and fried foods as much as possible. Avoid sweets and sugar as sugar use has been linked to worsening of memory function.  There is always a concern of gradual progression of memory problems. If this is the case, then we may need to adjust level of care according to patient needs. Support, both to the patient and caregiver, should then be put into place.    The Alzheimer's Association is here all day, every day for people facing Alzheimer's disease through our free 24/7 Helpline: 9897697680. The Helpline provides reliable information and support to all those who need assistance, such as individuals living with memory loss, Alzheimer's or other dementia, caregivers, health care professionals and the public.  Our highly trained and knowledgeable staff can help you with: Understanding memory loss, dementia and Alzheimer's  Medications and other treatment options  General information about aging and brain health  Skills to provide quality care and to find the best care from professionals  Legal, financial and living-arrangement decisions Our Helpline also features: Confidential care consultation provided by master's level clinicians who can help with decision-making support, crisis assistance and education on issues families face every day  Help in a caller's preferred language  using our translation service that features more than 200 languages and dialects  Referrals to local community programs, services and  ongoing support     FALL PRECAUTIONS: Be cautious when walking. Scan the area for obstacles that may increase the risk of trips and falls. When getting up in the mornings, sit up at the edge of the bed for a few minutes before getting out of bed. Consider elevating the bed at the head end to avoid drop of blood pressure when getting up. Walk always in a well-lit room (use night lights in the walls). Avoid area rugs or power cords from appliances in the middle of the walkways. Use a walker or a cane if necessary and consider physical therapy for balance exercise. Get your eyesight checked regularly.  FINANCIAL OVERSIGHT: Supervision, especially oversight when making financial decisions or transactions is also recommended.  HOME SAFETY: Consider the safety of the kitchen when operating appliances like stoves, microwave oven, and blender. Consider having supervision and share cooking responsibilities until no longer able to participate in those. Accidents with firearms and other hazards in the house should be identified and addressed as well.   ABILITY TO BE LEFT ALONE: If patient is unable to contact 911 operator, consider using LifeLine, or when the need is there, arrange for someone to stay with patients. Smoking is a fire hazard, consider supervision or cessation. Risk of wandering should be assessed by caregiver and if detected at any point, supervision and safe proof recommendations should be instituted.  MEDICATION SUPERVISION: Inability to self-administer medication needs to be constantly addressed. Implement a mechanism to ensure safe administration of the medications.   DRIVING: Regarding driving, in patients with progressive memory problems, driving will be impaired. We advise to have someone else do the driving if trouble finding directions or if minor accidents are reported. Independent driving assessment is available to determine safety of driving.   If you are interested in the  driving assessment, you can contact the following:  [employer] in [place] [phone]  [facility] [phone]  [facility] [phone] or [phone]      Mediterranean Diet A Mediterranean diet refers to food and lifestyle choices that are based on the traditions of countries located on the [employer]. This way of eating has been shown to help prevent certain conditions and improve outcomes for people who have chronic diseases, like kidney disease and heart disease. What are tips for following this plan? Lifestyle  Cook and eat meals together with your family, when possible. Drink enough fluid to keep your urine clear or pale yellow. Be physically active every day. This includes: Aerobic exercise like running or swimming. Leisure activities like gardening, walking, or housework. Get 7-8 hours of sleep each night. If recommended by your health care provider, drink red wine in moderation. This means 1 glass a day for nonpregnant women and 2 glasses a day for men. A glass of wine equals 5 oz (150 mL). Reading food labels  Check the serving size of packaged foods. For foods such as rice and pasta, the serving size refers to the amount of cooked product, not dry. Check the total fat in packaged foods. Avoid foods that have saturated fat or trans fats. Check the ingredients list for added sugars, such as corn syrup. Shopping  At the grocery store, buy most of your food from the areas near the walls of the store. This includes: Fresh fruits and vegetables (produce). Grains,  beans, nuts, and seeds. Some of these may be available in unpackaged forms or large amounts (in bulk). Fresh seafood. Poultry and eggs. Low-fat dairy products. Buy whole ingredients instead of prepackaged foods. Buy fresh fruits and vegetables in-season from local farmers markets. Buy frozen fruits and vegetables in resealable  bags. If you do not have access to quality fresh seafood, buy precooked frozen shrimp or canned fish, such as tuna, salmon, or sardines. Buy small amounts of raw or cooked vegetables, salads, or olives from the deli or salad bar at your store. Stock your pantry so you always have certain foods on hand, such as olive oil, canned tuna, canned tomatoes, rice, pasta, and beans. Cooking  Cook foods with extra-virgin olive oil instead of using butter or other vegetable oils. Have meat as a side dish, and have vegetables or grains as your main dish. This means having meat in small portions or adding small amounts of meat to foods like pasta or stew. Use beans or vegetables instead of meat in common dishes like chili or lasagna. Experiment with different cooking methods. Try roasting or broiling vegetables instead of steaming or sauteing them. Add frozen vegetables to soups, stews, pasta, or rice. Add nuts or seeds for added healthy fat at each meal. You can add these to yogurt, salads, or vegetable dishes. Marinate fish or vegetables using olive oil, lemon juice, garlic, and fresh herbs. Meal planning  Plan to eat 1 vegetarian meal one day each week. Try to work up to 2 vegetarian meals, if possible. Eat seafood 2 or more times a week. Have healthy snacks readily available, such as: Vegetable sticks with hummus. Greek yogurt. Fruit and nut trail mix. Eat balanced meals throughout the week. This includes: Fruit: 2-3 servings a day Vegetables: 4-5 servings a day Low-fat dairy: 2 servings a day Fish, poultry, or lean meat: 1 serving a day Beans and legumes: 2 or more servings a week Nuts and seeds: 1-2 servings a day Whole grains: 6-8 servings a day Extra-virgin olive oil: 3-4 servings a day Limit red meat and sweets to only a few servings a month What are my food choices? Mediterranean diet Recommended Grains: Whole-grain pasta. Brown rice. Bulgar wheat. Polenta. Couscous. Whole-wheat bread.  [place]. Vegetables: Artichokes. Beets. Broccoli. Cabbage. Carrots. Eggplant. Green beans. Chard. Kale. Spinach. Onions. Leeks. Peas. Squash. Tomatoes. Peppers. Radishes. Fruits: Apples. Apricots. Avocado. Berries. Bananas. Cherries. Dates. Figs. Grapes. Lemons. Melon. Oranges. Peaches. Plums. Pomegranate. Meats and other protein foods: Beans. Almonds. Sunflower seeds. Pine nuts. Peanuts. Cod. Salmon. Scallops. Shrimp. Tuna. Tilapia. Clams. Oysters. Eggs. Dairy: Low-fat milk. Cheese. Greek yogurt. Beverages: Water. Red wine. Herbal tea. Fats and oils: Extra virgin olive oil. Avocado oil. Grape seed oil. Sweets and desserts: Greek yogurt with honey. Baked apples. Poached pears. Trail mix. Seasoning and other foods: Basil. Cilantro. Coriander. Cumin. Mint. Parsley. Sage. Rosemary. Tarragon. Garlic. Oregano. Thyme. Pepper. Balsalmic vinegar. Tahini. Hummus. Tomato sauce. Olives. Mushrooms. Limit these Grains: Prepackaged pasta or rice dishes. Prepackaged cereal with added sugar. Vegetables: Deep fried potatoes (french fries). Fruits: Fruit canned in syrup. Meats and other protein foods: Beef. Pork. Lamb. Poultry with skin. Hot dogs. [place]. Dairy: Ice cream. Sour cream. Whole milk. Beverages: Juice. Sugar-sweetened soft drinks. Beer. Liquor and spirits. Fats and oils: Butter. Canola oil. Vegetable oil. Beef fat (tallow). Lard. Sweets and desserts: Cookies. Cakes. Pies. Candy. Seasoning and other foods: Mayonnaise. Premade sauces and marinades. The items listed may not be a complete list. Talk with your dietitian about  what dietary choices are right for you. Summary The Mediterranean diet includes both food and lifestyle choices. Eat a variety of fresh fruits and vegetables, beans, nuts, seeds, and whole grains. Limit the amount of red meat and sweets that you eat. Talk with your health care provider about whether it is safe for you to drink red wine in moderation. This means 1 glass a day  for nonpregnant women and 2 glasses a day for men. A glass of wine equals 5 oz (150 mL). This information is not intended to replace advice given to you by your health care provider. Make sure you discuss any questions you have with your health care provider. Document Released: 10/01/2015 Document Revised: 11/03/2015 Document Reviewed: 10/01/2015 Elsevier Interactive Patient Education  2017 [employer].

## 2023-04-10 DIAGNOSIS — R1030 Lower abdominal pain, unspecified: Secondary | ICD-10-CM | POA: Diagnosis not present

## 2023-04-10 DIAGNOSIS — F0150 Vascular dementia without behavioral disturbance: Secondary | ICD-10-CM | POA: Diagnosis not present

## 2023-04-19 ENCOUNTER — Other Ambulatory Visit: Payer: Self-pay | Admitting: Physician Assistant

## 2023-04-20 DIAGNOSIS — R278 Other lack of coordination: Secondary | ICD-10-CM | POA: Diagnosis not present

## 2023-04-20 DIAGNOSIS — F0283 Dementia in other diseases classified elsewhere, unspecified severity, with mood disturbance: Secondary | ICD-10-CM | POA: Diagnosis not present

## 2023-04-20 DIAGNOSIS — M629 Disorder of muscle, unspecified: Secondary | ICD-10-CM | POA: Diagnosis not present

## 2023-04-22 DIAGNOSIS — M629 Disorder of muscle, unspecified: Secondary | ICD-10-CM | POA: Diagnosis not present

## 2023-04-22 DIAGNOSIS — F0283 Dementia in other diseases classified elsewhere, unspecified severity, with mood disturbance: Secondary | ICD-10-CM | POA: Diagnosis not present

## 2023-04-22 DIAGNOSIS — R278 Other lack of coordination: Secondary | ICD-10-CM | POA: Diagnosis not present

## 2023-04-23 DIAGNOSIS — F0283 Dementia in other diseases classified elsewhere, unspecified severity, with mood disturbance: Secondary | ICD-10-CM | POA: Diagnosis not present

## 2023-04-23 DIAGNOSIS — R278 Other lack of coordination: Secondary | ICD-10-CM | POA: Diagnosis not present

## 2023-04-23 DIAGNOSIS — M629 Disorder of muscle, unspecified: Secondary | ICD-10-CM | POA: Diagnosis not present

## 2023-04-24 DIAGNOSIS — M629 Disorder of muscle, unspecified: Secondary | ICD-10-CM | POA: Diagnosis not present

## 2023-04-24 DIAGNOSIS — R278 Other lack of coordination: Secondary | ICD-10-CM | POA: Diagnosis not present

## 2023-04-24 DIAGNOSIS — F0283 Dementia in other diseases classified elsewhere, unspecified severity, with mood disturbance: Secondary | ICD-10-CM | POA: Diagnosis not present

## 2023-04-25 DIAGNOSIS — G8191 Hemiplegia, unspecified affecting right dominant side: Secondary | ICD-10-CM | POA: Diagnosis not present

## 2023-04-25 DIAGNOSIS — M2011 Hallux valgus (acquired), right foot: Secondary | ICD-10-CM | POA: Diagnosis not present

## 2023-04-25 DIAGNOSIS — M2021 Hallux rigidus, right foot: Secondary | ICD-10-CM | POA: Diagnosis not present

## 2023-04-26 DIAGNOSIS — M629 Disorder of muscle, unspecified: Secondary | ICD-10-CM | POA: Diagnosis not present

## 2023-04-26 DIAGNOSIS — F0283 Dementia in other diseases classified elsewhere, unspecified severity, with mood disturbance: Secondary | ICD-10-CM | POA: Diagnosis not present

## 2023-04-26 DIAGNOSIS — R278 Other lack of coordination: Secondary | ICD-10-CM | POA: Diagnosis not present

## 2023-04-27 DIAGNOSIS — F0283 Dementia in other diseases classified elsewhere, unspecified severity, with mood disturbance: Secondary | ICD-10-CM | POA: Diagnosis not present

## 2023-04-27 DIAGNOSIS — M629 Disorder of muscle, unspecified: Secondary | ICD-10-CM | POA: Diagnosis not present

## 2023-04-27 DIAGNOSIS — R278 Other lack of coordination: Secondary | ICD-10-CM | POA: Diagnosis not present

## 2023-05-02 DIAGNOSIS — M629 Disorder of muscle, unspecified: Secondary | ICD-10-CM | POA: Diagnosis not present

## 2023-05-02 DIAGNOSIS — R278 Other lack of coordination: Secondary | ICD-10-CM | POA: Diagnosis not present

## 2023-05-02 DIAGNOSIS — F0283 Dementia in other diseases classified elsewhere, unspecified severity, with mood disturbance: Secondary | ICD-10-CM | POA: Diagnosis not present

## 2023-05-06 DIAGNOSIS — R278 Other lack of coordination: Secondary | ICD-10-CM | POA: Diagnosis not present

## 2023-05-06 DIAGNOSIS — F0283 Dementia in other diseases classified elsewhere, unspecified severity, with mood disturbance: Secondary | ICD-10-CM | POA: Diagnosis not present

## 2023-05-06 DIAGNOSIS — M629 Disorder of muscle, unspecified: Secondary | ICD-10-CM | POA: Diagnosis not present

## 2023-05-07 DIAGNOSIS — M629 Disorder of muscle, unspecified: Secondary | ICD-10-CM | POA: Diagnosis not present

## 2023-05-07 DIAGNOSIS — F0283 Dementia in other diseases classified elsewhere, unspecified severity, with mood disturbance: Secondary | ICD-10-CM | POA: Diagnosis not present

## 2023-05-07 DIAGNOSIS — R278 Other lack of coordination: Secondary | ICD-10-CM | POA: Diagnosis not present

## 2023-05-09 ENCOUNTER — Encounter (HOSPITAL_COMMUNITY): Payer: Self-pay

## 2023-05-09 ENCOUNTER — Other Ambulatory Visit: Payer: Self-pay

## 2023-05-09 ENCOUNTER — Emergency Department (HOSPITAL_COMMUNITY)
Admission: EM | Admit: 2023-05-09 | Discharge: 2023-05-10 | Disposition: A | Discharge status: Home / Self Care | Attending: Emergency Medicine | Admitting: Emergency Medicine

## 2023-05-09 DIAGNOSIS — G4489 Other headache syndrome: Secondary | ICD-10-CM | POA: Diagnosis not present

## 2023-05-09 DIAGNOSIS — M629 Disorder of muscle, unspecified: Secondary | ICD-10-CM | POA: Diagnosis not present

## 2023-05-09 DIAGNOSIS — K2100 Gastro-esophageal reflux disease with esophagitis, without bleeding: Secondary | ICD-10-CM | POA: Insufficient documentation

## 2023-05-09 DIAGNOSIS — S300XXA Contusion of lower back and pelvis, initial encounter: Secondary | ICD-10-CM | POA: Insufficient documentation

## 2023-05-09 DIAGNOSIS — W19XXXA Unspecified fall, initial encounter: Secondary | ICD-10-CM | POA: Diagnosis not present

## 2023-05-09 DIAGNOSIS — K449 Diaphragmatic hernia without obstruction or gangrene: Secondary | ICD-10-CM | POA: Diagnosis not present

## 2023-05-09 DIAGNOSIS — I672 Cerebral atherosclerosis: Secondary | ICD-10-CM | POA: Diagnosis not present

## 2023-05-09 DIAGNOSIS — I6782 Cerebral ischemia: Secondary | ICD-10-CM | POA: Diagnosis not present

## 2023-05-09 DIAGNOSIS — W1839XA Other fall on same level, initial encounter: Secondary | ICD-10-CM | POA: Diagnosis not present

## 2023-05-09 DIAGNOSIS — R4781 Slurred speech: Secondary | ICD-10-CM | POA: Diagnosis not present

## 2023-05-09 DIAGNOSIS — S0990XA Unspecified injury of head, initial encounter: Secondary | ICD-10-CM | POA: Diagnosis not present

## 2023-05-09 DIAGNOSIS — N281 Cyst of kidney, acquired: Secondary | ICD-10-CM | POA: Diagnosis not present

## 2023-05-09 DIAGNOSIS — G9389 Other specified disorders of brain: Secondary | ICD-10-CM | POA: Diagnosis not present

## 2023-05-09 DIAGNOSIS — R1084 Generalized abdominal pain: Secondary | ICD-10-CM | POA: Diagnosis not present

## 2023-05-09 DIAGNOSIS — F0283 Dementia in other diseases classified elsewhere, unspecified severity, with mood disturbance: Secondary | ICD-10-CM | POA: Diagnosis not present

## 2023-05-09 DIAGNOSIS — R278 Other lack of coordination: Secondary | ICD-10-CM | POA: Diagnosis not present

## 2023-05-09 DIAGNOSIS — S3992XA Unspecified injury of lower back, initial encounter: Secondary | ICD-10-CM | POA: Diagnosis present

## 2023-05-09 DIAGNOSIS — T148XXA Other injury of unspecified body region, initial encounter: Secondary | ICD-10-CM

## 2023-05-09 NOTE — ED Triage Notes (Signed)
 Pt BIB [facility] from [facility] facility d/t an unwitnessed fall at unknown time according to wife.  Pt has some slurred speech and ABD pain but all is normal according to facility and wife.  Wife wanted him to come to get head scanned but NOT on any thinners.

## 2023-05-10 ENCOUNTER — Emergency Department (HOSPITAL_COMMUNITY)

## 2023-05-10 ENCOUNTER — Encounter: Payer: Self-pay | Admitting: Gastroenterology

## 2023-05-10 DIAGNOSIS — W19XXXA Unspecified fall, initial encounter: Secondary | ICD-10-CM | POA: Diagnosis not present

## 2023-05-10 DIAGNOSIS — K5900 Constipation, unspecified: Secondary | ICD-10-CM | POA: Diagnosis not present

## 2023-05-10 DIAGNOSIS — I6782 Cerebral ischemia: Secondary | ICD-10-CM | POA: Diagnosis not present

## 2023-05-10 DIAGNOSIS — N401 Enlarged prostate with lower urinary tract symptoms: Secondary | ICD-10-CM | POA: Diagnosis not present

## 2023-05-10 DIAGNOSIS — R5383 Other fatigue: Secondary | ICD-10-CM | POA: Diagnosis not present

## 2023-05-10 DIAGNOSIS — Z7401 Bed confinement status: Secondary | ICD-10-CM | POA: Diagnosis not present

## 2023-05-10 DIAGNOSIS — I10 Essential (primary) hypertension: Secondary | ICD-10-CM | POA: Diagnosis not present

## 2023-05-10 DIAGNOSIS — R4781 Slurred speech: Secondary | ICD-10-CM | POA: Diagnosis not present

## 2023-05-10 DIAGNOSIS — N281 Cyst of kidney, acquired: Secondary | ICD-10-CM | POA: Diagnosis not present

## 2023-05-10 DIAGNOSIS — F339 Major depressive disorder, recurrent, unspecified: Secondary | ICD-10-CM | POA: Diagnosis not present

## 2023-05-10 DIAGNOSIS — K449 Diaphragmatic hernia without obstruction or gangrene: Secondary | ICD-10-CM | POA: Diagnosis not present

## 2023-05-10 DIAGNOSIS — I69315 Cognitive social or emotional deficit following cerebral infarction: Secondary | ICD-10-CM | POA: Diagnosis not present

## 2023-05-10 DIAGNOSIS — I672 Cerebral atherosclerosis: Secondary | ICD-10-CM | POA: Diagnosis not present

## 2023-05-10 DIAGNOSIS — E785 Hyperlipidemia, unspecified: Secondary | ICD-10-CM | POA: Diagnosis not present

## 2023-05-10 DIAGNOSIS — G9389 Other specified disorders of brain: Secondary | ICD-10-CM | POA: Diagnosis not present

## 2023-05-10 DIAGNOSIS — F0280 Dementia in other diseases classified elsewhere without behavioral disturbance: Secondary | ICD-10-CM | POA: Diagnosis not present

## 2023-05-10 DIAGNOSIS — F0150 Vascular dementia without behavioral disturbance: Secondary | ICD-10-CM | POA: Diagnosis not present

## 2023-05-10 DIAGNOSIS — K219 Gastro-esophageal reflux disease without esophagitis: Secondary | ICD-10-CM | POA: Diagnosis not present

## 2023-05-10 LAB — CBC WITH DIFFERENTIAL/PLATELET
Abs Immature Granulocytes: 0.02 10*3/uL (ref 0.00–0.07)
Basophils Absolute: 0 10*3/uL (ref 0.0–0.1)
Basophils Relative: 0 %
Eosinophils Absolute: 0 10*3/uL (ref 0.0–0.5)
Eosinophils Relative: 0 %
HCT: 44 % (ref 39.0–52.0)
Hemoglobin: 15 g/dL (ref 13.0–17.0)
Immature Granulocytes: 0 %
Lymphocytes Relative: 17 %
Lymphs Abs: 1.5 10*3/uL (ref 0.7–4.0)
MCH: 28.9 pg (ref 26.0–34.0)
MCHC: 34.1 g/dL (ref 30.0–36.0)
MCV: 84.8 fL (ref 80.0–100.0)
Monocytes Absolute: 0.6 10*3/uL (ref 0.1–1.0)
Monocytes Relative: 6 %
Neutro Abs: 6.9 10*3/uL (ref 1.7–7.7)
Neutrophils Relative %: 77 %
Platelets: 309 10*3/uL (ref 150–400)
RBC: 5.19 MIL/uL (ref 4.22–5.81)
RDW: 13.8 % (ref 11.5–15.5)
WBC: 9 10*3/uL (ref 4.0–10.5)
nRBC: 0 % (ref 0.0–0.2)

## 2023-05-10 LAB — COMPREHENSIVE METABOLIC PANEL
ALT: 27 U/L (ref 0–44)
AST: 27 U/L (ref 15–41)
Albumin: 4.2 g/dL (ref 3.5–5.0)
Alkaline Phosphatase: 95 U/L (ref 38–126)
Anion gap: 12 (ref 5–15)
BUN: 16 mg/dL (ref 8–23)
CO2: 25 mmol/L (ref 22–32)
Calcium: 9.2 mg/dL (ref 8.9–10.3)
Chloride: 102 mmol/L (ref 98–111)
Creatinine, Ser: 0.92 mg/dL (ref 0.61–1.24)
GFR, Estimated: >60 mL/min (ref >60)
Glucose, Bld: 129 mg/dL — ABNORMAL HIGH (ref 70–99)
Potassium: 3.4 mmol/L — ABNORMAL LOW (ref 3.5–5.1)
Sodium: 139 mmol/L (ref 135–145)
Total Bilirubin: 0.8 mg/dL (ref 0.0–1.2)
Total Protein: 7.5 g/dL (ref 6.5–8.1)

## 2023-05-10 MED ORDER — ONDANSETRON 4 MG PO TBDP
4.0000 mg | ORAL_TABLET | Freq: Three times a day (TID) | ORAL | 0 refills | Status: AC | PRN
Start: 2023-05-10 — End: ?

## 2023-05-10 MED ORDER — PANTOPRAZOLE SODIUM 40 MG PO TBEC: 40.0000 mg | DELAYED_RELEASE_TABLET | Freq: Every day | ORAL | 0 refills | Status: AC

## 2023-05-10 MED ORDER — IOHEXOL 350 MG/ML SOLN
75.0000 mL | Freq: Once | INTRAVENOUS | Status: AC | PRN
  Administered 2023-05-10: 75 mL via INTRAVENOUS

## 2023-05-10 MED ORDER — ONDANSETRON HCL 4 MG/2ML IJ SOLN
4.0000 mg | Freq: Once | INTRAMUSCULAR | Status: AC
  Administered 2023-05-10: 4 mg via INTRAVENOUS
  Filled 2023-05-10: qty 2

## 2023-05-10 NOTE — ED Provider Notes (Signed)
 [place] EMERGENCY DEPARTMENT AT [facility] Provider Note   CSN: [id number] Arrival date & time: 05/09/23  2304     History  Chief Complaint  Patient presents with   [name] is a 68 y.o. male.  68 yo M here after unwitnessed fall. Patient usually is in a w/c but uses walker sometimes for short times. H/o r sided deficits related to previous stroke and slurred speech related to same. Unknown mechanism.   Wife also states he has chronic abdominal pain and issues that has been looked into previously but did have an episode of emesis after supper today and has been complaining of worsening abdominal pain.    Fall       Home Medications Prior to Admission medications   Medication Sig Start Date End Date Taking? Authorizing Provider  ondansetron (ZOFRAN-ODT) 4 MG disintegrating tablet Take 1 tablet (4 mg total) by mouth every 8 (eight) hours as needed for vomiting. 05/10/23  Yes Nyelle Wolfson, Barbara Cower, MD  pantoprazole (PROTONIX) 40 MG tablet Take 1 tablet (40 mg total) by mouth daily. 05/10/23  Yes Henrick Mcgue, Barbara Cower, MD  acetaminophen (TYLENOL) 325 MG tablet Take 650 mg by mouth daily as needed for mild pain (pain score 1-3).    [provider]  escitalopram (LEXAPRO) 10 MG tablet Take 10 mg by mouth daily. 01/16/23   [provider]  olmesartan (BENICAR) 20 MG tablet Take 20 mg by mouth daily. 11/28/22   [provider]  QUEtiapine (SEROQUEL) 25 MG tablet TAKE 1 TABLET BY MOUTH EVERYDAY AT BEDTIME 04/19/23   Marcos Eke, PA-C  rosuvastatin (CRESTOR) 10 MG tablet Take 10 mg by mouth daily. 01/12/23   [provider]  tamsulosin (FLOMAX) 0.4 MG CAPS capsule Take 0.4 mg by mouth at bedtime.    [provider]      Allergies    Patient has no known allergies.    Review of Systems   Review of Systems  Physical Exam Updated Vital Signs BP 104/75   Pulse 91   Temp 98.1 F (36.7 C) (Oral)   Resp 14   Ht 5\' 6"   (1.676 m)   Wt 83 kg   SpO2 94%   BMI 29.53 kg/m  Physical Exam Vitals and nursing note reviewed.  Constitutional:      Appearance: He is well-developed.  HENT:     Head: Normocephalic and atraumatic.     Nose: No congestion or rhinorrhea.  Cardiovascular:     Rate and Rhythm: Normal rate.  Pulmonary:     Effort: Pulmonary effort is normal. No respiratory distress.  Abdominal:     General: There is no distension.  Musculoskeletal:        General: Normal range of motion.     Cervical back: Normal range of motion.  Skin:    General: Skin is warm and dry.  Neurological:     Mental Status: He is alert. Mental status is at baseline.     Comments: Difficult to understand, right sided deficits, doesn't follow commands well     ED Results / Procedures / Treatments   Labs (all labs ordered are listed, but only abnormal results are displayed) Labs Reviewed  COMPREHENSIVE METABOLIC PANEL - Abnormal; Notable for the following components:      Result Value   Potassium 3.4 (*)    Glucose, Bld 129 (*)    All other components within normal limits  CBC WITH DIFFERENTIAL/PLATELET  EKG None  Radiology CT ABDOMEN PELVIS W CONTRAST Result Date: 05/10/2023 CLINICAL DATA:  Unwitnessed fall at home. Slurred speech and abdominal pain. EXAM: CT ABDOMEN AND PELVIS WITH CONTRAST TECHNIQUE: Multidetector CT imaging of the abdomen and pelvis was performed using the standard protocol following bolus administration of intravenous contrast. RADIATION DOSE REDUCTION: This exam was performed according to the departmental dose-optimization program which includes automated exposure control, adjustment of the mA and/or kV according to patient size and/or use of iterative reconstruction technique. CONTRAST:  75mL OMNIPAQUE IOHEXOL 350 MG/ML SOLN COMPARISON:  CT abdomen pelvis with contrast 01/29/2023, CT chest, abdomen and pelvis with contrast 10/12/2022. FINDINGS: Lower chest: There are subpleural  scar-like changes in both lung bases but no active process is seen. There is mild bronchial thickening in the lower lobes. The cardiac size is normal. There is a small hiatal hernia, interval increased thickening at the GE junction. This may warrant endoscopy but is usually due to reflux. Hepatobiliary: No focal liver abnormality is seen. No gallstones, gallbladder wall thickening, or biliary dilatation. Pancreas: No abnormality. Spleen: No abnormality. Adrenals/Urinary Tract: There is no adrenal mass. There are Bosniak 1 cysts in both kidneys, on the right anteriorly measuring 2.2 cm and 18 Hounsfield units, on the left laterally in the lower pole measuring 1.8 cm and 23 Hounsfield units. No follow-up imaging is recommended. There are occasional tiny Bosniak 2 cysts which are too small to characterize. No mass is seen. No urinary stone or obstruction. The bladder is unremarkable for the degree of distension. Stomach/Bowel: No dilatation or overt wall thickening. There is fluid in the colon. Vascular/Lymphatic: Aortic atherosclerosis. No enlarged abdominal or pelvic lymph nodes. Reproductive: Prostate is slightly prominent. There is a TURP defect. Both testicles are in the scrotal sac. Other: The pelvic fluid, free hemorrhage, free air or incarcerated hernia. Musculoskeletal: There is a subcutaneous hematoma in the outer upper right buttock area measuring 4.2 x 3 x 2.4 cm, with surrounding stranding. There is degenerative disc disease in the lower thoracic spine and at L3-4 and L4-5, with facet spurring. There oral are bridging osteophytes of the anterior left SI joint.No acute or other significant osseous findings. IMPRESSION: 1. 4.2 x 3 x 2.4 cm subcutaneous hematoma in the outer upper right buttock area with surrounding stranding. 2. No other acute trauma related findings in the abdomen or pelvis. 3. Small hiatal hernia with interval increased thickening at the GE junction. This may warrant endoscopy but is  usually due to reflux. 4. Fluid in the colon which can be seen with diarrhea. 5. Aortic atherosclerosis. 6. Mild prostatomegaly with TURP defect. Electronically Signed   By: Almira Bar M.D.   On: 05/10/2023 02:50   CT Head Wo Contrast Result Date: 05/10/2023 CLINICAL DATA:  Unwitnessed fall.  Slurred speech. EXAM: CT HEAD WITHOUT CONTRAST TECHNIQUE: Contiguous axial images were obtained from the base of the skull through the vertex without intravenous contrast. RADIATION DOSE REDUCTION: This exam was performed according to the departmental dose-optimization program which includes automated exposure control, adjustment of the mA and/or kV according to patient size and/or use of iterative reconstruction technique. COMPARISON:  CT head 02/23/2023 FINDINGS: Brain: No intracranial hemorrhage, mass effect, or evidence of acute infarct. No hydrocephalus. No extra-axial fluid collection. Generalized cerebral atrophy with ex vacuo dilatation of the ventricles. Chronic small vessel ischemic change in the white matter. Vascular: No hyperdense vessel. Intracranial arterial calcification. Skull: No fracture or focal lesion. Sinuses/Orbits: No acute finding. Other: None. IMPRESSION: No acute  intracranial abnormality. Electronically Signed   By: Minerva Fester M.D.   On: 05/10/2023 02:31    Procedures Procedures    Medications Ordered in ED Medications  ondansetron (ZOFRAN) injection 4 mg (4 mg Intravenous Given 05/10/23 0113)  iohexol (OMNIPAQUE) 350 MG/ML injection 75 mL (75 mLs Intravenous Contrast Given 05/10/23 0154)    ED Course/ Medical Decision Making/ A&P                                 Medical Decision Making Amount and/or Complexity of Data Reviewed Labs: ordered. Radiology: ordered.  Risk Prescription drug management.   Eval for traumatic injuries and abdominal pain.  Suspect abdominal pain is from GERD with the findings of esophageal thickenin gon ct scan.  Does not appear to be on any  kind of an acids at home we will initiate those.  As far as the fall goes no obvious traumatic injuries.  Patient tolerating p.o. now.  Appears to be stable for discharge.  GI referral placed for endoscopy as indicated.  Final Clinical Impression(s) / ED Diagnoses Final diagnoses:  Fall, initial encounter  Hematoma  Gastroesophageal reflux disease with esophagitis without hemorrhage    Rx / DC Orders ED Discharge Orders          Ordered    pantoprazole (PROTONIX) 40 MG tablet  Daily        05/10/23 0403    ondansetron (ZOFRAN-ODT) 4 MG disintegrating tablet  Every 8 hours PRN        05/10/23 0403    Ambulatory referral to Gastroenterology        05/10/23 0404              Lambert Jeanty, Barbara Cower, MD 05/10/23 2345

## 2023-05-12 DIAGNOSIS — F331 Major depressive disorder, recurrent, moderate: Secondary | ICD-10-CM | POA: Diagnosis not present

## 2023-05-12 DIAGNOSIS — I69315 Cognitive social or emotional deficit following cerebral infarction: Secondary | ICD-10-CM | POA: Diagnosis not present

## 2023-05-12 DIAGNOSIS — F0150 Vascular dementia without behavioral disturbance: Secondary | ICD-10-CM | POA: Diagnosis not present

## 2023-05-18 DIAGNOSIS — R278 Other lack of coordination: Secondary | ICD-10-CM | POA: Diagnosis not present

## 2023-05-18 DIAGNOSIS — M629 Disorder of muscle, unspecified: Secondary | ICD-10-CM | POA: Diagnosis not present

## 2023-05-18 DIAGNOSIS — F0283 Dementia in other diseases classified elsewhere, unspecified severity, with mood disturbance: Secondary | ICD-10-CM | POA: Diagnosis not present

## 2023-05-20 DIAGNOSIS — R278 Other lack of coordination: Secondary | ICD-10-CM | POA: Diagnosis not present

## 2023-05-20 DIAGNOSIS — F0283 Dementia in other diseases classified elsewhere, unspecified severity, with mood disturbance: Secondary | ICD-10-CM | POA: Diagnosis not present

## 2023-05-20 DIAGNOSIS — M629 Disorder of muscle, unspecified: Secondary | ICD-10-CM | POA: Diagnosis not present

## 2023-05-22 DIAGNOSIS — I10 Essential (primary) hypertension: Secondary | ICD-10-CM | POA: Diagnosis not present

## 2023-05-22 DIAGNOSIS — F331 Major depressive disorder, recurrent, moderate: Secondary | ICD-10-CM | POA: Diagnosis not present

## 2023-05-26 DIAGNOSIS — F0150 Vascular dementia without behavioral disturbance: Secondary | ICD-10-CM | POA: Diagnosis not present

## 2023-05-26 DIAGNOSIS — F331 Major depressive disorder, recurrent, moderate: Secondary | ICD-10-CM | POA: Diagnosis not present

## 2023-05-26 DIAGNOSIS — I69315 Cognitive social or emotional deficit following cerebral infarction: Secondary | ICD-10-CM | POA: Diagnosis not present

## 2023-05-31 DIAGNOSIS — M629 Disorder of muscle, unspecified: Secondary | ICD-10-CM | POA: Diagnosis not present

## 2023-05-31 DIAGNOSIS — F0283 Dementia in other diseases classified elsewhere, unspecified severity, with mood disturbance: Secondary | ICD-10-CM | POA: Diagnosis not present

## 2023-05-31 DIAGNOSIS — R278 Other lack of coordination: Secondary | ICD-10-CM | POA: Diagnosis not present

## 2023-07-03 ENCOUNTER — Ambulatory Visit: Admitting: Gastroenterology

## 2023-07-06 DIAGNOSIS — Z0001 Encounter for general adult medical examination with abnormal findings: Secondary | ICD-10-CM | POA: Diagnosis not present

## 2023-07-06 DIAGNOSIS — F5104 Psychophysiologic insomnia: Secondary | ICD-10-CM | POA: Diagnosis not present

## 2023-07-06 DIAGNOSIS — N400 Enlarged prostate without lower urinary tract symptoms: Secondary | ICD-10-CM | POA: Diagnosis not present

## 2023-07-06 DIAGNOSIS — R54 Age-related physical debility: Secondary | ICD-10-CM | POA: Diagnosis not present

## 2023-07-06 DIAGNOSIS — G8929 Other chronic pain: Secondary | ICD-10-CM | POA: Diagnosis not present

## 2023-07-06 DIAGNOSIS — I700 Atherosclerosis of aorta: Secondary | ICD-10-CM | POA: Diagnosis not present

## 2023-07-06 DIAGNOSIS — M25551 Pain in right hip: Secondary | ICD-10-CM | POA: Diagnosis not present

## 2023-07-06 DIAGNOSIS — R4701 Aphasia: Secondary | ICD-10-CM | POA: Diagnosis not present

## 2023-07-06 DIAGNOSIS — F0390 Unspecified dementia without behavioral disturbance: Secondary | ICD-10-CM | POA: Diagnosis not present

## 2023-07-15 IMAGING — MR MR HEAD WO/W CM
12 series · 48 of 48 positions shown · IV contrast (multihance)
Comparison: Brain MRI 04/12/2010

CLINICAL DATA: Memory loss, confusion, difficulty speaking for 1
year

EXAM:
MRI HEAD WITHOUT AND WITH CONTRAST
TECHNIQUE: Multiplanar, multiecho pulse sequences of the brain and surrounding
structures were obtained without and with intravenous contrast.
CONTRAST:  13mL MULTIHANCE GADOBENATE DIMEGLUMINE 529 MG/ML IV SOLN

[Series 3: T1 · sagittal · 5.0mm · 0.45mm/px · 2 of 23 slices shown]
[im 1/23]
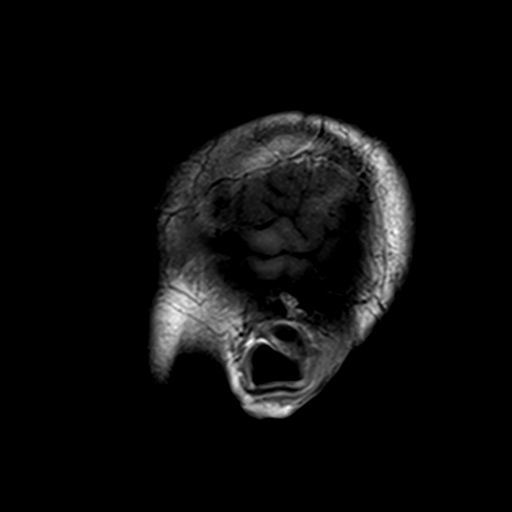
[im 23/23]
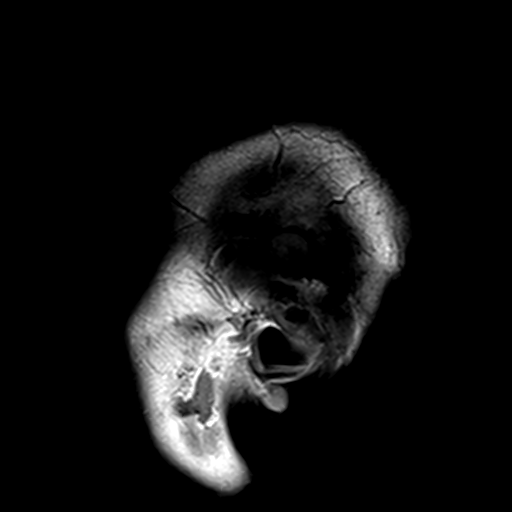

[Series 4: DWI · axial · 3.0mm · 1.80mm/px · z∈[-49,+97]mm · 7 of 100 slices shown (1 of 4)]
[im 1/100]
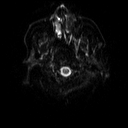
[im 17/100]
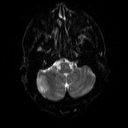
[im 34/100]
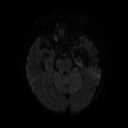
[im 50/100]
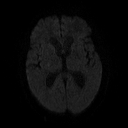
[im 67/100]
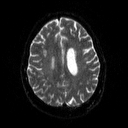
[im 83/100]
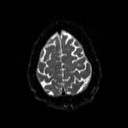
[im 100/100]
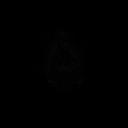

[Series 5: DWI · axial · 3.0mm · 1.80mm/px · z∈[-49,+97]mm · 3 of 49 slices shown (2 of 4)]
[im 1/49]
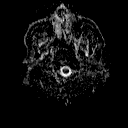
[im 25/49]
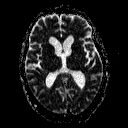
[im 49/49]
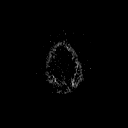

[Series 6: DWI · coronal · 5.0mm · 1.80mm/px · 5 of 72 slices shown (3 of 4)]
[im 1/72]
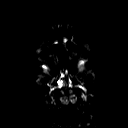
[im 18/72]
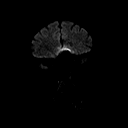
[im 36/72]
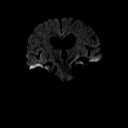
[im 54/72]
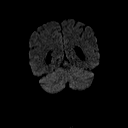
[im 72/72]
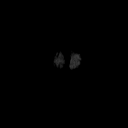

[Series 7: DWI · coronal · 5.0mm · 1.80mm/px · 2 of 36 slices shown (4 of 4)]
[im 1/36]
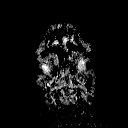
[im 36/36]
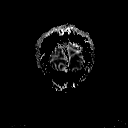

[Series 8: T2 · axial · 5.0mm · 0.60mm/px · 1 of 22 slices shown (1 of 2)]
[im 1/22]
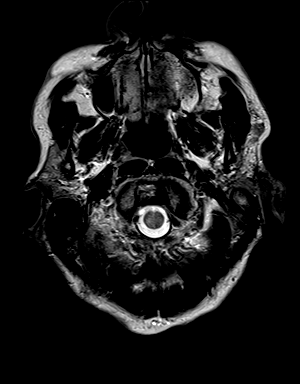

[Series 9: FLAIR · axial · 3.0mm · 0.45mm/px · z∈[-43,+91]mm · 2 of 30 slices shown]
[im 1/30]
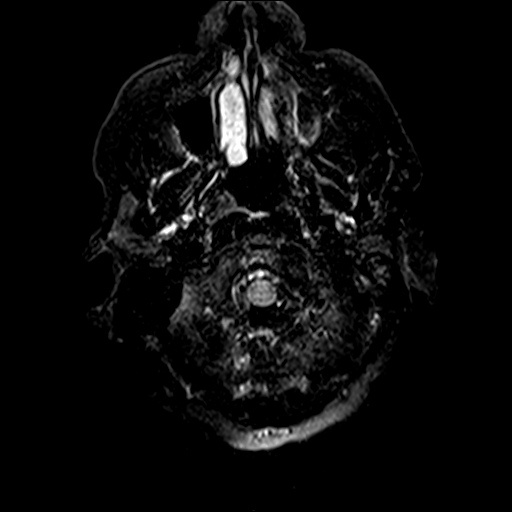
[im 30/30]
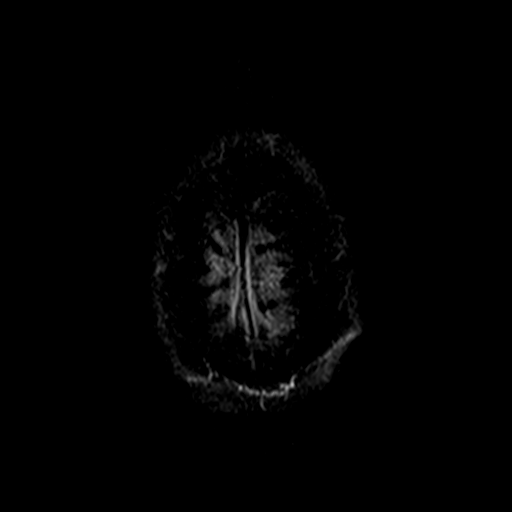

[Series 11: swi_images · axial · 4.0mm · 0.90mm/px · z∈[-46,+94]mm · 2 of 36 slices shown]
[im 1/36]
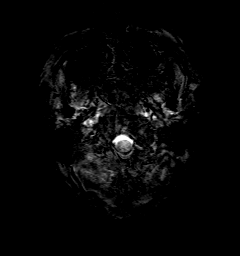
[im 36/36]
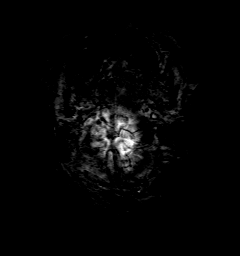

[Series 12: t1_mpr_tra · axial · 1.0mm · 0.75mm/px · z∈[-63,+82]mm · 10 of 144 slices shown]
[im 1/144]
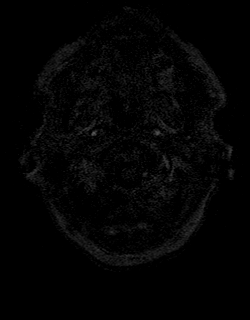
[im 16/144]
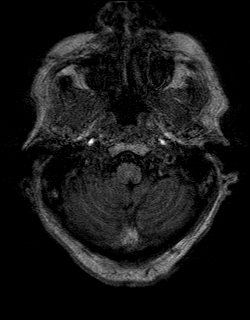
[im 32/144]
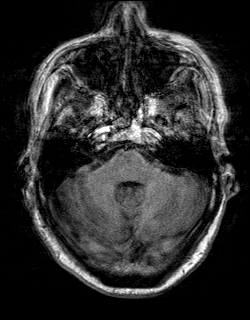
[im 48/144]
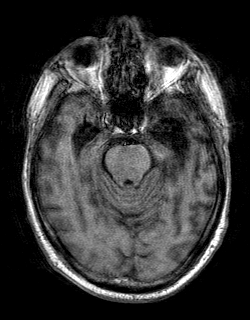
[im 64/144]
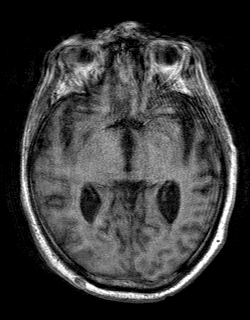
[im 80/144]
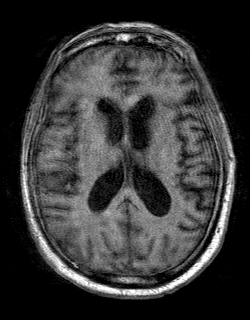
[im 96/144]
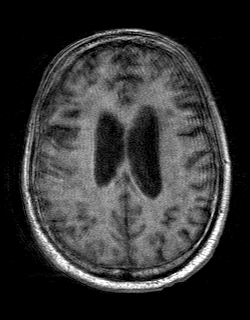
[im 112/144]
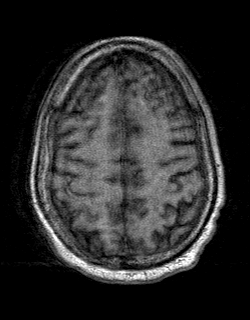
[im 128/144]
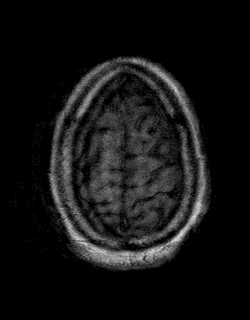
[im 144/144]
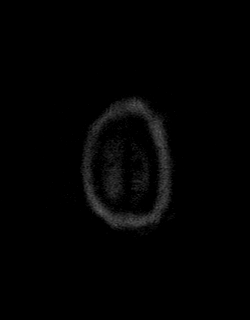

[Series 13: T2 · coronal · 5.0mm · 0.45mm/px · 2 of 29 slices shown (2 of 2)]
[im 1/29]
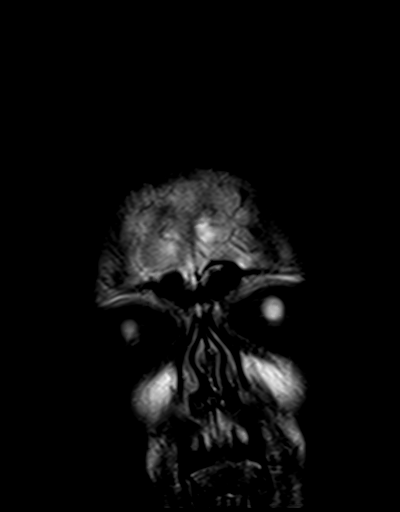
[im 29/29]
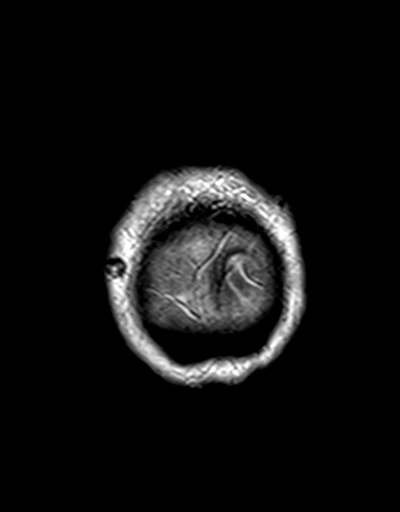

[Series 14: t1_mpr_tra post · axial · 1.0mm · 0.75mm/px · z∈[-63,+82]mm · 10 of 144 slices shown]
[im 1/144]
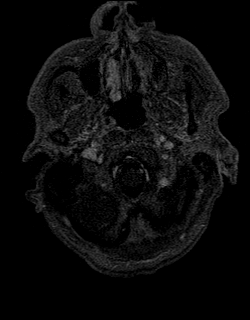
[im 16/144]
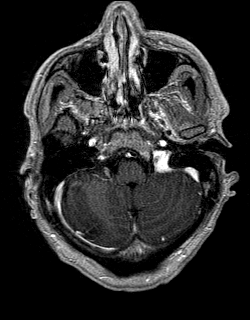
[im 32/144]
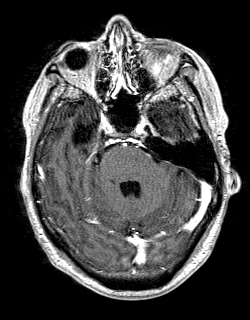
[im 48/144]
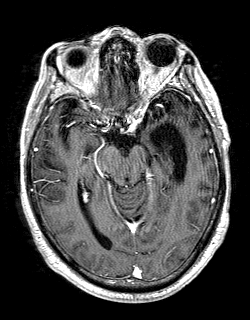
[im 64/144]
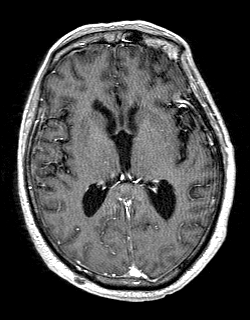
[im 80/144]
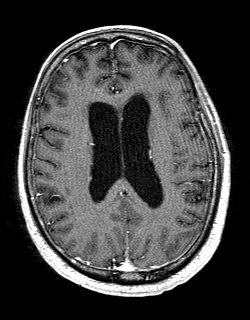
[im 96/144]
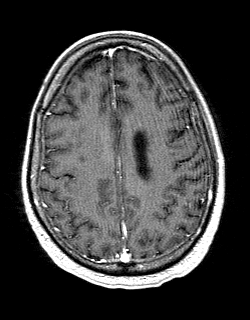
[im 112/144]
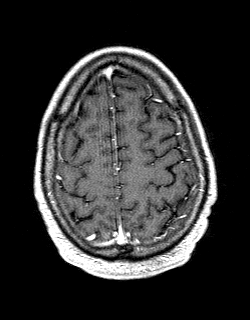
[im 128/144]
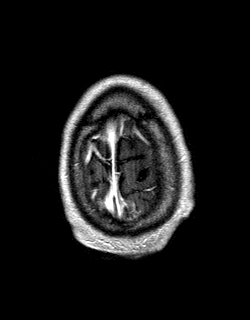
[im 144/144]
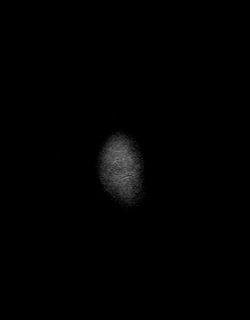

[Series 15: post cor · coronal · 5.0mm · 0.45mm/px · 2 of 29 slices shown]
[im 1/29]
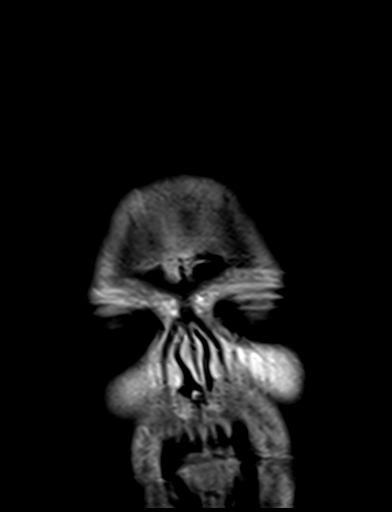
[im 29/29]
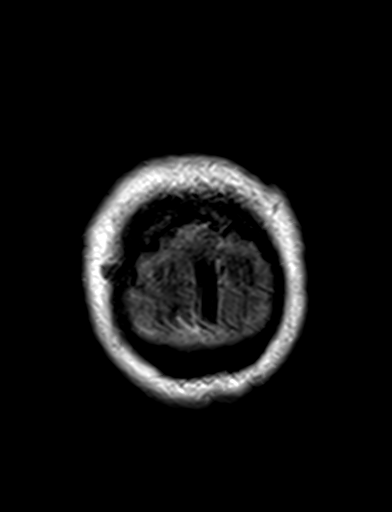

[48 of 48 positions shown; findings below may reference images not displayed]

FINDINGS: Brain: There is no evidence of acute intracranial hemorrhage,
extra-axial fluid collection, or acute infarct.

There is mild global parenchymal volume loss. There is
encephalomalacia in the anterior temporal lobes bilaterally with ex
vacuo dilatation of the temporal horns, new since [DATE] there is
mild hippocampal atrophy which appears proportionate to the degree
of overall parenchymal volume loss. There are patchy foci of FLAIR
signal abnormality throughout the subcortical and periventricular
white matter, nonspecific but likely reflecting sequela of
mild-to-moderate chronic white matter microangiopathy, also
progressed since [DATE].

There is no abnormal mass lesion or abnormal enhancement. There is
no midline shift.

Vascular: Normal flow voids.

Skull and upper cervical spine: Normal marrow signal.

Sinuses/Orbits: There is mild mucosal thickening in the paranasal
sinuses. The globes and orbits are unremarkable.

Other: None.
IMPRESSION: 1. Encephalomalacia in the bilateral anterior temporal lobes with
associated ex vacuo dilatation of the temporal horns may reflect
sequela of prior trauma or less likely infarcts, new since [DATE]. Overall mild global parenchymal volume loss with proportionate
hippocampal atrophy.
3. Mild-to-moderate chronic white matter microangiopathy, also
progressed since [DATE].

## 2023-07-26 DIAGNOSIS — I739 Peripheral vascular disease, unspecified: Secondary | ICD-10-CM | POA: Diagnosis not present

## 2023-07-26 DIAGNOSIS — F03918 Unspecified dementia, unspecified severity, with other behavioral disturbance: Secondary | ICD-10-CM | POA: Diagnosis not present

## 2023-07-26 DIAGNOSIS — I700 Atherosclerosis of aorta: Secondary | ICD-10-CM | POA: Diagnosis not present

## 2023-07-26 DIAGNOSIS — R32 Unspecified urinary incontinence: Secondary | ICD-10-CM | POA: Diagnosis not present

## 2023-07-26 DIAGNOSIS — N400 Enlarged prostate without lower urinary tract symptoms: Secondary | ICD-10-CM | POA: Diagnosis not present

## 2023-07-26 DIAGNOSIS — R739 Hyperglycemia, unspecified: Secondary | ICD-10-CM | POA: Diagnosis not present

## 2023-07-26 DIAGNOSIS — R159 Full incontinence of feces: Secondary | ICD-10-CM | POA: Diagnosis not present

## 2023-08-02 DIAGNOSIS — R112 Nausea with vomiting, unspecified: Secondary | ICD-10-CM | POA: Diagnosis not present

## 2023-08-02 DIAGNOSIS — R32 Unspecified urinary incontinence: Secondary | ICD-10-CM | POA: Diagnosis not present

## 2023-08-02 DIAGNOSIS — R159 Full incontinence of feces: Secondary | ICD-10-CM | POA: Diagnosis not present

## 2023-08-02 DIAGNOSIS — N400 Enlarged prostate without lower urinary tract symptoms: Secondary | ICD-10-CM | POA: Diagnosis not present

## 2023-08-03 DIAGNOSIS — E785 Hyperlipidemia, unspecified: Secondary | ICD-10-CM | POA: Diagnosis not present

## 2023-08-03 DIAGNOSIS — R739 Hyperglycemia, unspecified: Secondary | ICD-10-CM | POA: Diagnosis not present

## 2023-08-03 DIAGNOSIS — I739 Peripheral vascular disease, unspecified: Secondary | ICD-10-CM | POA: Diagnosis not present

## 2023-08-03 DIAGNOSIS — Z79899 Other long term (current) drug therapy: Secondary | ICD-10-CM | POA: Diagnosis not present

## 2023-08-09 DIAGNOSIS — E785 Hyperlipidemia, unspecified: Secondary | ICD-10-CM | POA: Diagnosis not present

## 2023-08-09 DIAGNOSIS — F03918 Unspecified dementia, unspecified severity, with other behavioral disturbance: Secondary | ICD-10-CM | POA: Diagnosis not present

## 2023-08-09 DIAGNOSIS — R739 Hyperglycemia, unspecified: Secondary | ICD-10-CM | POA: Diagnosis not present

## 2023-08-09 DIAGNOSIS — I739 Peripheral vascular disease, unspecified: Secondary | ICD-10-CM | POA: Diagnosis not present

## 2023-08-09 DIAGNOSIS — N400 Enlarged prostate without lower urinary tract symptoms: Secondary | ICD-10-CM | POA: Diagnosis not present

## 2023-08-16 DIAGNOSIS — R609 Edema, unspecified: Secondary | ICD-10-CM | POA: Diagnosis not present

## 2023-08-16 DIAGNOSIS — R109 Unspecified abdominal pain: Secondary | ICD-10-CM | POA: Diagnosis not present

## 2023-08-16 DIAGNOSIS — Z125 Encounter for screening for malignant neoplasm of prostate: Secondary | ICD-10-CM | POA: Diagnosis not present

## 2023-08-16 DIAGNOSIS — M6281 Muscle weakness (generalized): Secondary | ICD-10-CM | POA: Diagnosis not present

## 2023-08-17 ENCOUNTER — Telehealth: Payer: Self-pay | Admitting: Physician Assistant

## 2023-08-17 DIAGNOSIS — Z125 Encounter for screening for malignant neoplasm of prostate: Secondary | ICD-10-CM | POA: Diagnosis not present

## 2023-08-17 NOTE — Telephone Encounter (Signed)
 Will send to Camie on her desk, thanks and noted

## 2023-08-17 NOTE — Telephone Encounter (Signed)
 Pt. Faxed Disability forms and has not been seen since Jan. 2025, left inbox to see if it is applicable to be filled out before we ask for payment

## 2023-08-17 NOTE — Telephone Encounter (Signed)
 Pt's wife came in asking for a letter stating the date the patient was diagnosed with dementia. She will come pick it up once it is ready.

## 2023-08-22 ENCOUNTER — Encounter: Payer: Self-pay | Admitting: Physician Assistant

## 2023-08-22 NOTE — Progress Notes (Signed)
 error

## 2023-08-24 DIAGNOSIS — Z0279 Encounter for issue of other medical certificate: Secondary | ICD-10-CM

## 2023-08-30 DIAGNOSIS — R609 Edema, unspecified: Secondary | ICD-10-CM | POA: Diagnosis not present

## 2023-08-30 DIAGNOSIS — M79604 Pain in right leg: Secondary | ICD-10-CM | POA: Diagnosis not present

## 2023-08-30 DIAGNOSIS — R109 Unspecified abdominal pain: Secondary | ICD-10-CM | POA: Diagnosis not present

## 2023-08-30 DIAGNOSIS — I708 Atherosclerosis of other arteries: Secondary | ICD-10-CM | POA: Diagnosis not present

## 2023-08-30 DIAGNOSIS — I739 Peripheral vascular disease, unspecified: Secondary | ICD-10-CM | POA: Diagnosis not present

## 2023-09-06 DIAGNOSIS — I708 Atherosclerosis of other arteries: Secondary | ICD-10-CM | POA: Diagnosis not present

## 2023-09-06 DIAGNOSIS — R609 Edema, unspecified: Secondary | ICD-10-CM | POA: Diagnosis not present

## 2023-09-08 NOTE — Telephone Encounter (Signed)
 I refaxed paper to life and disability claims

## 2023-09-08 NOTE — Telephone Encounter (Signed)
 Pt.s wife would like Disability refaxed pls

## 2023-09-20 ENCOUNTER — Telehealth: Payer: Self-pay | Admitting: Physician Assistant

## 2023-09-20 NOTE — Telephone Encounter (Signed)
 This has been resent with fax, I will call her in clinic now.

## 2023-09-20 NOTE — Telephone Encounter (Signed)
 Caller wants a call back to vf the fax number is correct. Forms were to be sent for disability

## 2023-09-27 DIAGNOSIS — R609 Edema, unspecified: Secondary | ICD-10-CM | POA: Diagnosis not present

## 2023-09-27 DIAGNOSIS — M6281 Muscle weakness (generalized): Secondary | ICD-10-CM | POA: Diagnosis not present

## 2023-09-27 DIAGNOSIS — L89151 Pressure ulcer of sacral region, stage 1: Secondary | ICD-10-CM | POA: Diagnosis not present

## 2023-09-28 NOTE — Telephone Encounter (Signed)
 We have now mailed the information as well 09/28/23

## 2023-10-02 ENCOUNTER — Other Ambulatory Visit: Payer: Self-pay

## 2023-10-02 DIAGNOSIS — I739 Peripheral vascular disease, unspecified: Secondary | ICD-10-CM

## 2023-10-03 ENCOUNTER — Ambulatory Visit: Attending: Vascular Surgery | Admitting: Physician Assistant

## 2023-10-03 ENCOUNTER — Ambulatory Visit (HOSPITAL_COMMUNITY)
Admission: RE | Admit: 2023-10-03 | Discharge: 2023-10-03 | Disposition: A | Discharge status: Home / Self Care | Source: Ambulatory Visit | Attending: Vascular Surgery | Admitting: Vascular Surgery

## 2023-10-03 ENCOUNTER — Encounter: Payer: Self-pay | Admitting: Physician Assistant

## 2023-10-03 VITALS — BP 138/83 | HR 75 | Temp 97.7°F | Wt 182.0 lb

## 2023-10-03 DIAGNOSIS — I739 Peripheral vascular disease, unspecified: Secondary | ICD-10-CM

## 2023-10-03 NOTE — Progress Notes (Signed)
 Office Note     CC:  follow up Requesting Provider:  [facility]  HPI: [name] is a 68 y.o. (1955/07/04) male who presents with his daughter for follow up of PAD. She provides most of history. Patient has major neurocognitive disorder and dementia and is non verbal. He has had in the past right great toe pain as well as bilateral lower extremity swelling. His non invasive studies suggested mild arterial disease bilaterally with triphasic flow. No intervention was recommended. He was followed by Podiatry for his bunions.  Today he reports no pain. He says that he has not seen his Podiatrist, Dr. Alona in some time. He continues to have some swelling in both legs. Daughter explains that he sits in his wheel chair with feet in dependent position most of day. Only has his legs up when sleeping. He has no wounds. He does not ambulate but can assist with pivoting or transferring. He is medically managed on statin.   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   Semantic vs logopenic   Pure hypercholesterolemia    Renal cyst    Shoulder joint pain, right    Wears glasses     Past Surgical History:  Procedure Laterality Date   TONSILLECTOMY  age 73   TRANSURETHRAL RESECTION OF PROSTATE N/A 02/05/2018   Procedure: TRANSURETHRAL RESECTION OF THE PROSTATE (TURP);  Surgeon: Sherrilee Belvie CROME, MD;  Location: [facility];  Service: Urology;  Laterality: N/A;    Social History   Socioeconomic History   Marital status: Married    Spouse name: Not on file   Number of children: 7   Years of education:  10   Highest education level: 10th grade  Occupational History   Occupation: [employer] examiner  Tobacco Use   Smoking status: Never   Smokeless tobacco: Never  Vaping Use   Vaping status: Never Used  Substance and Sexual Activity   Alcohol use: Not Currently    Comment: occasional   Drug use: Never   Sexual activity: Not on file  Other Topics Concern   Not on file  Social History Narrative   Lives with wife   Right handed   One story home   Daughter is IT sales professional at [employer] unit.   Social Drivers of Corporate investment banker Strain: Not on file  Food Insecurity: Not on file  Transportation Needs: Not on file  Physical Activity: Not on file  Stress: Not on file  Social Connections: Not on file  Intimate Partner Violence: Not on file    Family History  Problem Relation Age of Onset   Dementia Father     Current Outpatient Medications  Medication Sig Dispense Refill   acetaminophen  (TYLENOL ) 325 MG tablet Take 650 mg by mouth daily as needed for mild pain (pain score 1-3).     escitalopram (LEXAPRO) 10 MG tablet Take 10 mg by mouth daily.     olmesartan (BENICAR) 20 MG tablet Take 20 mg by mouth daily.     ondansetron  (ZOFRAN -ODT) 4 MG disintegrating  tablet Take 1 tablet (4 mg total) by mouth every 8 (eight) hours as needed for vomiting. 30 tablet 0   pantoprazole  (PROTONIX ) 40 MG tablet Take 1 tablet (40 mg total) by mouth daily. 30 tablet 0   QUEtiapine  (SEROQUEL ) 25 MG tablet TAKE 1 TABLET BY MOUTH EVERYDAY AT BEDTIME 90 tablet 1   rosuvastatin  (CRESTOR ) 10 MG tablet Take 10 mg by mouth daily.     tamsulosin  (FLOMAX ) 0.4 MG CAPS capsule Take 0.4 mg by mouth at bedtime.     No current facility-administered medications for this visit.    No Known Allergies   REVIEW OF SYSTEMS:  Negative unless noted in HPI [X]  denotes positive finding, [ ]  denotes negative finding Cardiac  Comments:  Chest pain or chest pressure:    Shortness of  breath upon exertion:    Short of breath when lying flat:    Irregular heart rhythm:        Vascular    Pain in calf, thigh, or hip brought on by ambulation:    Pain in feet at night that wakes you up from your sleep:     Blood clot in your veins:    Leg swelling:         Pulmonary    Oxygen at home:    Productive cough:     Wheezing:         Neurologic    Sudden weakness in arms or legs:     Sudden numbness in arms or legs:     Sudden onset of difficulty speaking or slurred speech:    Temporary loss of vision in one eye:     Problems with dizziness:         Gastrointestinal    Blood in stool:     Vomited blood:         Genitourinary    Burning when urinating:     Blood in urine:        Psychiatric    Major depression:         Hematologic    Bleeding problems:    Problems with blood clotting too easily:        Skin    Rashes or ulcers:        Constitutional    Fever or chills:      PHYSICAL EXAMINATION:  Vitals:   10/03/23 0827  BP: 138/83  Pulse: 75  Temp: 97.7 F (36.5 C)  TempSrc: Temporal  Weight: 182 lb (82.6 kg)   General:  WDWN in NAD; vital signs documented above Gait: Not observed, in wheel chair HENT: WNL, normocephalic Pulmonary: normal non-labored breathing Cardiac: regular HR Abdomen: soft Vascular Exam/Pulses: 2+ femoral pulses, Doppler DP/ PT signals bilaterally Extremities: without ischemic changes, without Gangrene , without cellulitis; without open wounds;  Musculoskeletal: no muscle wasting or atrophy  Neurologic: A&O X 3 Psychiatric:  The pt has Normal affect.   Non-Invasive Vascular Imaging:   +-------+-----------+-----------+------------+------------+  ABI/TBIToday's ABIToday's TBIPrevious ABIPrevious TBI  +-------+-----------+-----------+------------+------------+  Right 1.01       0.53              0.93        0.54       +-------+-----------+-----------+------------+------------+  Left  1.04       0.76               1.01        0.65       +-------+-----------+----------+------------+------------+    ASSESSMENT/PLAN::  68 y.o. male here for follow up for PAD. He is without any rest pain or tissue loss. He is wheel chair bound so unable to evaluate for any claudication symptoms. He does continue to have bilateral lower extremity swelling but this is minimal - ABI's are unchanged from prior visit in 2023 - encourage him to elevate his legs during the day to help with swelling -continue Statin - follow up in 1 year with ABI    Teretha Damme, PA-C [facility] [phone]  On  MD:

## 2023-10-04 DIAGNOSIS — Z20822 Contact with and (suspected) exposure to covid-19: Secondary | ICD-10-CM | POA: Diagnosis not present

## 2023-10-04 DIAGNOSIS — R609 Edema, unspecified: Secondary | ICD-10-CM | POA: Diagnosis not present

## 2023-10-04 DIAGNOSIS — I739 Peripheral vascular disease, unspecified: Secondary | ICD-10-CM | POA: Diagnosis not present

## 2023-10-04 DIAGNOSIS — R0981 Nasal congestion: Secondary | ICD-10-CM | POA: Diagnosis not present

## 2023-10-04 DIAGNOSIS — R059 Cough, unspecified: Secondary | ICD-10-CM | POA: Diagnosis not present

## 2023-10-04 DIAGNOSIS — F03918 Unspecified dementia, unspecified severity, with other behavioral disturbance: Secondary | ICD-10-CM | POA: Diagnosis not present

## 2023-10-04 LAB — VAS US ABI WITH/WO TBI
Left ABI: 1.04
Right ABI: 1.01

## 2023-10-12 DIAGNOSIS — R059 Cough, unspecified: Secondary | ICD-10-CM | POA: Diagnosis not present

## 2023-10-12 DIAGNOSIS — J00 Acute nasopharyngitis [common cold]: Secondary | ICD-10-CM | POA: Diagnosis not present

## 2023-10-12 DIAGNOSIS — R0981 Nasal congestion: Secondary | ICD-10-CM | POA: Diagnosis not present

## 2023-10-18 DIAGNOSIS — W1830XA Fall on same level, unspecified, initial encounter: Secondary | ICD-10-CM | POA: Diagnosis not present

## 2023-10-18 DIAGNOSIS — L84 Corns and callosities: Secondary | ICD-10-CM | POA: Diagnosis not present

## 2023-10-18 DIAGNOSIS — F0390 Unspecified dementia without behavioral disturbance: Secondary | ICD-10-CM | POA: Diagnosis not present

## 2023-10-18 DIAGNOSIS — M79675 Pain in left toe(s): Secondary | ICD-10-CM | POA: Diagnosis not present

## 2023-10-18 DIAGNOSIS — R2681 Unsteadiness on feet: Secondary | ICD-10-CM | POA: Diagnosis not present

## 2023-10-18 DIAGNOSIS — F03918 Unspecified dementia, unspecified severity, with other behavioral disturbance: Secondary | ICD-10-CM | POA: Diagnosis not present

## 2023-10-18 DIAGNOSIS — I739 Peripheral vascular disease, unspecified: Secondary | ICD-10-CM | POA: Diagnosis not present

## 2023-10-18 DIAGNOSIS — L603 Nail dystrophy: Secondary | ICD-10-CM | POA: Diagnosis not present

## 2023-10-18 DIAGNOSIS — B351 Tinea unguium: Secondary | ICD-10-CM | POA: Diagnosis not present

## 2023-10-18 DIAGNOSIS — R609 Edema, unspecified: Secondary | ICD-10-CM | POA: Diagnosis not present

## 2023-10-18 DIAGNOSIS — M6281 Muscle weakness (generalized): Secondary | ICD-10-CM | POA: Diagnosis not present

## 2023-10-18 DIAGNOSIS — M79674 Pain in right toe(s): Secondary | ICD-10-CM | POA: Diagnosis not present

## 2023-10-25 DIAGNOSIS — F03918 Unspecified dementia, unspecified severity, with other behavioral disturbance: Secondary | ICD-10-CM | POA: Diagnosis not present

## 2023-10-25 DIAGNOSIS — R32 Unspecified urinary incontinence: Secondary | ICD-10-CM | POA: Diagnosis not present

## 2023-10-25 DIAGNOSIS — R609 Edema, unspecified: Secondary | ICD-10-CM | POA: Diagnosis not present

## 2023-10-25 DIAGNOSIS — R159 Full incontinence of feces: Secondary | ICD-10-CM | POA: Diagnosis not present

## 2023-10-25 DIAGNOSIS — I10 Essential (primary) hypertension: Secondary | ICD-10-CM | POA: Diagnosis not present

## 2023-10-25 DIAGNOSIS — N400 Enlarged prostate without lower urinary tract symptoms: Secondary | ICD-10-CM | POA: Diagnosis not present

## 2023-10-25 DIAGNOSIS — I739 Peripheral vascular disease, unspecified: Secondary | ICD-10-CM | POA: Diagnosis not present

## 2023-10-30 ENCOUNTER — Encounter (HOSPITAL_COMMUNITY): Payer: Self-pay | Admitting: Emergency Medicine

## 2023-10-30 ENCOUNTER — Other Ambulatory Visit: Payer: Self-pay

## 2023-10-30 ENCOUNTER — Emergency Department (HOSPITAL_COMMUNITY)
Admission: EM | Admit: 2023-10-30 | Discharge: 2023-10-31 | Disposition: A | Discharge status: Home / Self Care | Attending: Emergency Medicine | Admitting: Emergency Medicine

## 2023-10-30 ENCOUNTER — Emergency Department (HOSPITAL_COMMUNITY)

## 2023-10-30 DIAGNOSIS — R0989 Other specified symptoms and signs involving the circulatory and respiratory systems: Secondary | ICD-10-CM | POA: Diagnosis not present

## 2023-10-30 DIAGNOSIS — K921 Melena: Secondary | ICD-10-CM | POA: Diagnosis not present

## 2023-10-30 DIAGNOSIS — G8911 Acute pain due to trauma: Secondary | ICD-10-CM | POA: Diagnosis not present

## 2023-10-30 DIAGNOSIS — R109 Unspecified abdominal pain: Secondary | ICD-10-CM | POA: Diagnosis not present

## 2023-10-30 DIAGNOSIS — F0390 Unspecified dementia without behavioral disturbance: Secondary | ICD-10-CM | POA: Diagnosis not present

## 2023-10-30 DIAGNOSIS — K625 Hemorrhage of anus and rectum: Secondary | ICD-10-CM | POA: Diagnosis not present

## 2023-10-30 DIAGNOSIS — R059 Cough, unspecified: Secondary | ICD-10-CM | POA: Diagnosis not present

## 2023-10-30 DIAGNOSIS — K922 Gastrointestinal hemorrhage, unspecified: Secondary | ICD-10-CM | POA: Diagnosis not present

## 2023-10-30 LAB — TYPE AND SCREEN
ABO/RH(D): B NEG
Antibody Screen: NEGATIVE

## 2023-10-30 LAB — COMPREHENSIVE METABOLIC PANEL WITH GFR
ALT: 30 U/L (ref 0–44)
AST: 20 U/L (ref 15–41)
Albumin: 4.3 g/dL (ref 3.5–5.0)
Alkaline Phosphatase: 111 U/L (ref 38–126)
Anion gap: 13 (ref 5–15)
BUN: 19 mg/dL (ref 8–23)
CO2: 24 mmol/L (ref 22–32)
Calcium: 9.7 mg/dL (ref 8.9–10.3)
Chloride: 104 mmol/L (ref 98–111)
Creatinine, Ser: 0.74 mg/dL (ref 0.61–1.24)
GFR, Estimated: >60 mL/min (ref >60)
Glucose, Bld: 105 mg/dL — ABNORMAL HIGH (ref 70–99)
Potassium: 3.5 mmol/L (ref 3.5–5.1)
Sodium: 141 mmol/L (ref 135–145)
Total Bilirubin: 0.3 mg/dL (ref 0.0–1.2)
Total Protein: 7 g/dL (ref 6.5–8.1)

## 2023-10-30 LAB — CBC
HCT: 38.4 % — ABNORMAL LOW (ref 39.0–52.0)
Hemoglobin: 12.5 g/dL — ABNORMAL LOW (ref 13.0–17.0)
MCH: 28.8 pg (ref 26.0–34.0)
MCHC: 32.6 g/dL (ref 30.0–36.0)
MCV: 88.5 fL (ref 80.0–100.0)
Platelets: 271 K/uL (ref 150–400)
RBC: 4.34 MIL/uL (ref 4.22–5.81)
RDW: 14.1 % (ref 11.5–15.5)
WBC: 9.9 K/uL (ref 4.0–10.5)
nRBC: 0 % (ref 0.0–0.2)

## 2023-10-30 LAB — RESP PANEL BY RT-PCR (RSV, FLU A&B, COVID)  RVPGX2
Influenza A by PCR: NEGATIVE
Influenza B by PCR: NEGATIVE
Resp Syncytial Virus by PCR: NEGATIVE
SARS Coronavirus 2 by RT PCR: NEGATIVE

## 2023-10-30 LAB — LIPASE, BLOOD: Lipase: 60 U/L — ABNORMAL HIGH (ref 11–51)

## 2023-10-30 MED ORDER — IOHEXOL 300 MG/ML  SOLN
100.0000 mL | Freq: Once | INTRAMUSCULAR | Status: AC | PRN
  Administered 2023-10-30: 100 mL via INTRAVENOUS

## 2023-10-30 NOTE — ED Notes (Signed)
 Report given to Chichi at facility.

## 2023-10-30 NOTE — Discharge Instructions (Addendum)
 His blood count is mildly low today at 12.5, he will need to get this rechecked by his primary care provider.  He is also being referred to a gastroenterology specialist due to the bleeding in the stool.  However, if he develops recurrent or worsening bleeding, new or worsening abdominal pain, or any other new/concerning symptoms then return to the ER.

## 2023-10-30 NOTE — ED Provider Notes (Signed)
 [facility] AT [facility] Provider Note   CSN: [id number] Arrival date & time: 10/30/23  2001     Patient presents with: Rectal Bleeding   [name] is a 68 y.o. male.   HPI 68 year old male presents with concern for rectal bleeding.  History is from wife at the bedside.  Today when he had a bowel movement at his facility while she was there she noted that he had blood in the toilet.  It is unclear if it was just in the toilet or in the stool itself.  The stool itself was large but she did not feel like this was related to constipation.  He has seemingly been having some abdominal pain since this episode.  He has had a cough for about 3 days.  No blood thinner use.  Otherwise history is limited due to his chronic dementia.  As far as she knows, today was the first episode that blood has been seen.  Prior to Admission medications   Medication Sig Start Date End Date Taking? Authorizing Provider  acetaminophen  (TYLENOL ) 325 MG tablet Take 650 mg by mouth daily as needed for mild pain (pain score 1-3).    [provider]  escitalopram (LEXAPRO) 10 MG tablet Take 10 mg by mouth daily. 01/16/23   [provider]  olmesartan (BENICAR) 20 MG tablet Take 20 mg by mouth daily. 11/28/22   [provider]  ondansetron  (ZOFRAN -ODT) 4 MG disintegrating tablet Take 1 tablet (4 mg total) by mouth every 8 (eight) hours as needed for vomiting. 05/10/23   Mesner, Selinda, MD  pantoprazole  (PROTONIX ) 40 MG tablet Take 1 tablet (40 mg total) by mouth daily. 05/10/23   Mesner, Selinda, MD  QUEtiapine  (SEROQUEL ) 25 MG tablet TAKE 1 TABLET BY MOUTH EVERYDAY AT BEDTIME 04/19/23   Wertman, Sara E, PA-C  rosuvastatin  (CRESTOR ) 10 MG tablet Take 10 mg by mouth daily. 01/12/23   [provider]  tamsulosin  (FLOMAX ) 0.4 MG CAPS capsule Take 0.4 mg by mouth at bedtime.    [provider]    Allergies: Patient has no known allergies.     Review of Systems  Unable to perform ROS: Dementia    Updated Vital Signs BP 116/76   Pulse 85   Temp 97.8 F (36.6 C) (Oral)   Resp 16   SpO2 97%   Physical Exam Vitals and nursing note reviewed. Exam conducted with a chaperone present.  Constitutional:      General: He is not in acute distress.    Appearance: He is well-developed. He is not ill-appearing or diaphoretic.  HENT:     Head: Normocephalic and atraumatic.  Cardiovascular:     Rate and Rhythm: Normal rate and regular rhythm.     Heart sounds: Normal heart sounds.  Pulmonary:     Effort: Pulmonary effort is normal.     Breath sounds: Normal breath sounds.  Abdominal:     Palpations: Abdomen is soft.     Tenderness: There is abdominal tenderness (mild, hard to localize).  Genitourinary:    Comments: No obvious external or internal hemorrhoids appreciated.  There is a large amount of barrier cream on his buttocks that does limit some evaluation.  No gross blood or melena.  Hemoccult is negative. Skin:    General: Skin is warm and dry.  Neurological:     Mental Status: He is alert.     (all labs ordered are listed, but only abnormal results are displayed) Labs  Reviewed  COMPREHENSIVE METABOLIC PANEL WITH GFR - Abnormal; Notable for the following components:      Result Value   Glucose, Bld 105 (*)    All other components within normal limits  CBC - Abnormal; Notable for the following components:   Hemoglobin 12.5 (*)    HCT 38.4 (*)    All other components within normal limits  LIPASE, BLOOD - Abnormal; Notable for the following components:   Lipase 60 (*)    All other components within normal limits  RESP PANEL BY RT-PCR (RSV, FLU A&B, COVID)  RVPGX2  POC OCCULT BLOOD, ED  TYPE AND SCREEN  ABO/RH    EKG: None  Radiology: CT ABDOMEN PELVIS W CONTRAST Result Date: 10/30/2023 CLINICAL DATA:  Abdominal pain, rectal bleeding EXAM: CT ABDOMEN AND PELVIS WITH CONTRAST TECHNIQUE: Multidetector CT  imaging of the abdomen and pelvis was performed using the standard protocol following bolus administration of intravenous contrast. RADIATION DOSE REDUCTION: This exam was performed according to the departmental dose-optimization program which includes automated exposure control, adjustment of the mA and/or kV according to patient size and/or use of iterative reconstruction technique. CONTRAST:  OMNIPAQUE  IOHEXOL  300 MG/ML  SOLN COMPARISON:  05/10/2023 FINDINGS: Lower chest: No acute findings. Hepatobiliary: No focal hepatic abnormality. Gallbladder unremarkable. Pancreas: No focal abnormality or ductal dilatation. Spleen: No focal abnormality.  Normal size. Adrenals/Urinary Tract: No adrenal abnormality. No focal renal abnormality. No stones or hydronephrosis. Urinary bladder is unremarkable. Stomach/Bowel: Normal appendix. Stomach, large and small bowel grossly unremarkable. Vascular/Lymphatic: Aortic atherosclerosis. No evidence of aneurysm or adenopathy. Reproductive: No visible focal abnormality. Other: No free fluid or free air. Musculoskeletal: No acute bony abnormality. IMPRESSION: No acute findings in the abdomen or pelvis. Aortic atherosclerosis. Electronically Signed   By: Franky Crease M.D.   On: 10/30/2023 23:23   DG Chest Portable 1 View Result Date: 10/30/2023 CLINICAL DATA:  Cough rectal bleeding EXAM: PORTABLE CHEST 1 VIEW COMPARISON:  01/27/2023 FINDINGS: Low lung volumes. No acute airspace disease, pleural effusion or pneumothorax. Stable cardiomediastinal silhouette. IMPRESSION: No active disease. Low lung volumes. Electronically Signed   By: Luke Bun M.D.   On: 10/30/2023 20:57     Procedures   Medications Ordered in the ED  iohexol  (OMNIPAQUE ) 300 MG/ML solution 100 mL (100 mLs Intravenous Contrast Given 10/30/23 2300)                                    Medical Decision Making Amount and/or Complexity of Data Reviewed Labs: ordered.    Details: Mildly low hemoglobin at  12.5 Radiology: ordered and independent interpretation performed.    Details: No bowel obstruction  Risk Prescription drug management.   No further bleeding in the emergency department after being here 3+ hours.  No blood in the stool on digital rectal exam or melena.  Patient is well-appearing.  Vital signs have been stable.  He also has a nonspecific cough but no pneumonia or COVID.  Had multiple discussions with wife.  His hemoglobin is slightly lower than baseline but nonspecific at this time.  He is only had the 1 episode that she knows about.  Offered that we could consider admission/observation but she would rather him go back to the facility and return if symptoms worsen.  I think this is pretty reasonable.  He is not on blood thinners.  CT is reassuring.  At this point, we will discharge  and give outpatient gastroenterology referral and recommend repeat hemoglobin.  Otherwise, patient appears stable for discharge.     Final diagnoses:  Blood in stool    ED Discharge Orders          Ordered    Ambulatory referral to Gastroenterology        10/30/23 2331               Freddi Hamilton, MD 10/30/23 2333

## 2023-10-30 NOTE — ED Notes (Signed)
[facility] Called 

## 2023-10-30 NOTE — ED Triage Notes (Signed)
 Arrives w/ [facility] from [facility] w/ c/o light red rectal bleeding that began today. Abd pain as well. Dementia at baseline.  128/74 88 HR  168 CBG

## 2023-10-31 DIAGNOSIS — R531 Weakness: Secondary | ICD-10-CM | POA: Diagnosis not present

## 2023-10-31 DIAGNOSIS — Z7401 Bed confinement status: Secondary | ICD-10-CM | POA: Diagnosis not present

## 2023-10-31 NOTE — ED Notes (Signed)
 [facility] arrived to transport pt back to facility. AVS previously given to pt wife, who left after pt discharged.

## 2023-11-01 DIAGNOSIS — R1084 Generalized abdominal pain: Secondary | ICD-10-CM | POA: Diagnosis not present

## 2023-11-01 DIAGNOSIS — K625 Hemorrhage of anus and rectum: Secondary | ICD-10-CM | POA: Diagnosis not present

## 2023-11-06 NOTE — Progress Notes (Addendum)
 11/07/2023 [name] [id number] 1955-05-16  Referring provider: Freddi Hamilton, MD Primary GI doctor: Dr. Charlanne  ASSESSMENT AND PLAN:  Abdominal pain per wife, states he will jump if you touch his AB x 1 year Has had weight loss, some vomiting a few times, no diarrhea, unable to move Appears to be more RUQ or epigastirc colonoscopy about 2 years ago at facility, possible [facility], will try to get records Minimally elevated lipase 10/30/2023 10/30/2023 CT AP W unremarkable which is reassuring -Possible MSK with palpation to flank/side, will try salon pas patches -will get RUQ AB US , -suggest getting KUB to evaluate for constipation -Consider increase pantoprazole  40 mg twice a day for 1 month and then back to once a day for possible gastritis -Consider gastroparesis diet/GES study - follow up 2-3 months  normocytic anemia Hemoccult negative in ER 10/30/2023 10/30/2023  HGB 12.5 MCV 88.5 Platelets 271 B12 550 Recent Labs    01/27/23 1442 05/10/23 0053 10/30/23 2110  HGB 14.0 15.0 12.5*  Check iron, ferritin, B12 and folate  Fatty liver seen on AB Us  12/2020    Latest Ref Rng & Units 10/30/2023    9:10 PM 05/10/2023   12:53 AM 01/27/2023    2:42 PM  Hepatic Function  Total Protein 6.5 - 8.1 g/dL 7.0  7.5  7.2   Albumin 3.5 - 5.0 g/dL 4.3  4.2  4.2   AST 15 - 41 U/L 20  27  25    ALT 0 - 44 U/L 30  27  26    Alk Phosphatase 38 - 126 U/L 111  95  109   Total Bilirubin 0.0 - 1.2 mg/dL 0.3  0.8  0.7    Platelets 271  Check AB US  Check labs  Dementia with hemiplegia Comes from facility, wife is with him Unable to stand, in wheelchair [address], need to try to get information from them  Personal history of colon polyps Colonoscopy 10/19/2022 at [facility] endoscopy with Dr. Dianna for screening purposes showed adequate good bowel prep 10 mm polyp cecum 2 mm polyp sigmoid colon diverticula ascending descending sigmoid colon large internal hemorrhoids sigmoid colon TA  polyp, cecum hyperplastic states no planned repeat colonoscopy due to neurological comorbid conditions   Patient Care Team: [facility], [facility] as PCP - General Georjean Darice HERO, MD as Consulting Physician (Neurology)  HISTORY OF PRESENT ILLNESS: 68 y.o. male with a past medical history listed below presents for evaluation of AB pain.   Previously seen by Dr. Dianna 2023.   Discussed the use of AI scribe software for clinical note transcription with the patient, who gave verbal consent to proceed.  History of Present Illness   [name] is a 68 year old male who presents with abdominal pain.  He has been experiencing significant abdominal pain for about a year, primarily located in the epigastric area and near the ribcage. The pain is sensitive to touch, causing him to 'jump' when pressure is applied. It is persistent and often the first thing he mentions when asked about his well-being.  He has experienced occasional vomiting, but denies diarrhea or heartburn. His bowel movements occur daily, although his stools are described as large. No fever, chills, or shortness of breath. Occasionally, he experiences difficulty swallowing, but he is able to consume regular food.  A colonoscopy was performed approximately two years ago as a routine screening, which did not reveal any issues. Recent diagnostic imaging, including a CT scan and chest x-ray, did not show  any acute findings, although there was a note of low lung volume. The CT scan indicated the presence of some stool.  His current medications include Synalar for stools, Lasix, Metamucil, Senokot, tamsulosin , and pantoprazole  (Protonix ) once daily. There is uncertainty about whether he takes naproxen or other pain medications for his stomach.  He resides at [facility], where his medications are managed. He spends most of his time sitting in a chair and sleeps on his back without changing positions.        He  reports  that he has never smoked. He has never used smokeless tobacco. He reports that he does not currently use alcohol. He reports that he does not use drugs.  RELEVANT GI HISTORY, IMAGING AND LABS: Results   LABS Lipase: Elevated (10/30/2023) Hemoglobin: Decreased (10/30/2023)  RADIOLOGY Chest X-ray: Low lung volume (10/30/2023) Abdominal CT: No acute findings; unremarkable gallbladder (10/30/2023)      CBC    Component Value Date/Time   WBC 9.9 10/30/2023 2110   RBC 4.34 10/30/2023 2110   HGB 12.5 (L) 10/30/2023 2110   HCT 38.4 (L) 10/30/2023 2110   PLT 271 10/30/2023 2110   MCV 88.5 10/30/2023 2110   MCH 28.8 10/30/2023 2110   MCHC 32.6 10/30/2023 2110   RDW 14.1 10/30/2023 2110   LYMPHSABS 1.5 05/10/2023 0053   MONOABS 0.6 05/10/2023 0053   EOSABS 0.0 05/10/2023 0053   BASOSABS 0.0 05/10/2023 0053   Recent Labs    01/27/23 1442 05/10/23 0053 10/30/23 2110  HGB 14.0 15.0 12.5*    CMP     Component Value Date/Time   NA 141 10/30/2023 2110   K 3.5 10/30/2023 2110   CL 104 10/30/2023 2110   CO2 24 10/30/2023 2110   GLUCOSE 105 (H) 10/30/2023 2110   BUN 19 10/30/2023 2110   CREATININE 0.74 10/30/2023 2110   CALCIUM  9.7 10/30/2023 2110   PROT 7.0 10/30/2023 2110   ALBUMIN 4.3 10/30/2023 2110   AST 20 10/30/2023 2110   ALT 30 10/30/2023 2110   ALKPHOS 111 10/30/2023 2110   BILITOT 0.3 10/30/2023 2110   GFRNONAA >60 10/30/2023 2110   GFRAA >60 02/06/2018 0645      Latest Ref Rng & Units 10/30/2023    9:10 PM 05/10/2023   12:53 AM 01/27/2023    2:42 PM  Hepatic Function  Total Protein 6.5 - 8.1 g/dL 7.0  7.5  7.2   Albumin 3.5 - 5.0 g/dL 4.3  4.2  4.2   AST 15 - 41 U/L 20  27  25    ALT 0 - 44 U/L 30  27  26    Alk Phosphatase 38 - 126 U/L 111  95  109   Total Bilirubin 0.0 - 1.2 mg/dL 0.3  0.8  0.7       Current Medications:    Current Outpatient Medications (Cardiovascular):    olmesartan (BENICAR) 20 MG tablet, Take 20 mg by mouth daily.   rosuvastatin   (CRESTOR ) 10 MG tablet, Take 10 mg by mouth daily.   Current Outpatient Medications (Analgesics):    acetaminophen  (TYLENOL ) 325 MG tablet, Take 650 mg by mouth daily as needed for mild pain (pain score 1-3).   Current Outpatient Medications (Other):    escitalopram (LEXAPRO) 10 MG tablet, Take 10 mg by mouth daily.   ondansetron  (ZOFRAN -ODT) 4 MG disintegrating tablet, Take 1 tablet (4 mg total) by mouth every 8 (eight) hours as needed for vomiting.   pantoprazole  (PROTONIX ) 40 MG tablet, Take 1  tablet (40 mg total) by mouth daily.   QUEtiapine  (SEROQUEL ) 25 MG tablet, TAKE 1 TABLET BY MOUTH EVERYDAY AT BEDTIME   tamsulosin  (FLOMAX ) 0.4 MG CAPS capsule, Take 0.4 mg by mouth at bedtime.  Medical History:  Past Medical History:  Diagnosis Date   Benign prostatic hyperplasia without lower urinary tract symptoms 02/05/2018   Dementia (HCC)    Fatty liver    Hardening of the aorta (main artery of the heart)    Insomnia    Major neurocognitive disorder 03/24/2021   Male erectile dysfunction 12/21/2019   Nocturia more than twice per night    Pain in right hip    Personality disorder    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   Semantic vs logopenic   Pure hypercholesterolemia    Renal cyst    Shoulder joint pain, right    Wears glasses    Allergies: No Known Allergies   Surgical History:  He  has a past surgical history that includes Tonsillectomy (age 4); Transurethral resection of prostate (N/A, 02/05/2018); and Shoulder surgery. Family History:  His family history includes Dementia in his father.  REVIEW OF SYSTEMS  : All other systems reviewed and negative except where noted in the History of Present Illness.  PHYSICAL EXAM: BP (!) 142/86   Pulse 78  Physical Exam   GENERAL APPEARANCE: in wheelchair, in no apparent distress. HEENT: Head is normocephalic and atraumatic. No cervical lymphadenopathy, unremarkable thyroid, sclerae anicteric, conjunctiva pink. RESPIRATORY:  Coarse breath sounds at the bases. Respiratory effort normal, breath sounds equal bilateral without rales, rhonchi, wheezing. CARDIO: Regular rhythm with no murmurs, rubs, or gallops. Peripheral pulses intact. ABDOMEN: Soft, non-distended, active bowel sounds in all 4 quadrants, tenderness at right ribcage on palpation and RUQ/epigastric AB, no rebound tenderness, no mass appreciated RECTAL: Declines. MUSCULOSKELETAL: some spasticity left arm, hemiplegia right side, patient in wheelchair, unable to stand  SKIN: Dry, intact without rashes or lesions. No jaundice. NEURO: Alert, able to respond to pain, otherwise not verbal, not oriented PSYCH: Cooperative, normal mood and affect.      Alan JONELLE Coombs, PA-C 11:47 AM

## 2023-11-07 ENCOUNTER — Ambulatory Visit

## 2023-11-07 ENCOUNTER — Ambulatory Visit: Admitting: Physician Assistant

## 2023-11-07 ENCOUNTER — Encounter (HOSPITAL_COMMUNITY)

## 2023-11-07 ENCOUNTER — Other Ambulatory Visit (INDEPENDENT_AMBULATORY_CARE_PROVIDER_SITE_OTHER)

## 2023-11-07 ENCOUNTER — Encounter: Payer: Self-pay | Admitting: Physician Assistant

## 2023-11-07 ENCOUNTER — Ambulatory Visit: Payer: Self-pay | Admitting: Physician Assistant

## 2023-11-07 VITALS — BP 142/86 | HR 78

## 2023-11-07 DIAGNOSIS — R1011 Right upper quadrant pain: Secondary | ICD-10-CM | POA: Diagnosis not present

## 2023-11-07 DIAGNOSIS — D649 Anemia, unspecified: Secondary | ICD-10-CM

## 2023-11-07 DIAGNOSIS — G8190 Hemiplegia, unspecified affecting unspecified side: Secondary | ICD-10-CM

## 2023-11-07 DIAGNOSIS — R1013 Epigastric pain: Secondary | ICD-10-CM

## 2023-11-07 DIAGNOSIS — F0390 Unspecified dementia without behavioral disturbance: Secondary | ICD-10-CM

## 2023-11-07 DIAGNOSIS — F03C2 Unspecified dementia, severe, with psychotic disturbance: Secondary | ICD-10-CM

## 2023-11-07 DIAGNOSIS — R1110 Vomiting, unspecified: Secondary | ICD-10-CM

## 2023-11-07 DIAGNOSIS — K760 Fatty (change of) liver, not elsewhere classified: Secondary | ICD-10-CM | POA: Diagnosis not present

## 2023-11-07 DIAGNOSIS — R634 Abnormal weight loss: Secondary | ICD-10-CM

## 2023-11-07 LAB — COMPREHENSIVE METABOLIC PANEL WITH GFR
ALT: 24 U/L (ref 0–53)
AST: 21 U/L (ref 0–37)
Albumin: 4.4 g/dL (ref 3.5–5.2)
Alkaline Phosphatase: 98 U/L (ref 39–117)
BUN: 12 mg/dL (ref 6–23)
CO2: 28 meq/L (ref 19–32)
Calcium: 9.6 mg/dL (ref 8.4–10.5)
Chloride: 106 meq/L (ref 96–112)
Creatinine, Ser: 0.77 mg/dL (ref 0.40–1.50)
GFR: 91.64 mL/min (ref >60.00)
Glucose, Bld: 81 mg/dL (ref 70–99)
Potassium: 3.5 meq/L (ref 3.5–5.1)
Sodium: 137 meq/L (ref 135–145)
Total Bilirubin: 0.4 mg/dL (ref 0.2–1.2)
Total Protein: 7.4 g/dL (ref 6.0–8.3)

## 2023-11-07 LAB — IBC + FERRITIN
Ferritin: 285.9 ng/mL (ref 22.0–322.0)
Iron: 62 ug/dL (ref 42–165)
Saturation Ratios: 19.4 % — ABNORMAL LOW (ref 20.0–50.0)
TIBC: 319.2 ug/dL (ref 250.0–450.0)
Transferrin: 228 mg/dL (ref 212.0–360.0)

## 2023-11-07 LAB — CBC WITH DIFFERENTIAL/PLATELET
Basophils Absolute: 0 K/uL (ref 0.0–0.1)
Basophils Relative: 0.6 % (ref 0.0–3.0)
Eosinophils Absolute: 0.2 K/uL (ref 0.0–0.7)
Eosinophils Relative: 3.2 % (ref 0.0–5.0)
HCT: 38.4 % — ABNORMAL LOW (ref 39.0–52.0)
Hemoglobin: 12.8 g/dL — ABNORMAL LOW (ref 13.0–17.0)
Lymphocytes Relative: 38.1 % (ref 12.0–46.0)
Lymphs Abs: 2.9 K/uL (ref 0.7–4.0)
MCHC: 33.3 g/dL (ref 30.0–36.0)
MCV: 87.4 fl (ref 78.0–100.0)
Monocytes Absolute: 0.5 K/uL (ref 0.1–1.0)
Monocytes Relative: 6.4 % (ref 3.0–12.0)
Neutro Abs: 3.9 K/uL (ref 1.4–7.7)
Neutrophils Relative %: 51.7 % (ref 43.0–77.0)
Platelets: 241 K/uL (ref 150.0–400.0)
RBC: 4.39 Mil/uL (ref 4.22–5.81)
RDW: 14.6 % (ref 11.5–15.5)
WBC: 7.6 K/uL (ref 4.0–10.5)

## 2023-11-07 LAB — SEDIMENTATION RATE: Sed Rate: 21 mm/h — ABNORMAL HIGH (ref 0–20)

## 2023-11-07 NOTE — Patient Instructions (Addendum)
 Your provider has requested that you go to the basement level for lab work before leaving today. Press B on the elevator. The lab is located at the first door on the left as you exit the elevator.  Due to recent changes in healthcare laws, you may see the results of your imaging and laboratory studies on MyChart before your provider has had a chance to review them.  We understand that in some cases there may be results that are confusing or concerning to you. Not all laboratory results come back in the same time frame and the provider may be waiting for multiple results in order to interpret others.  Please give us  48 hours in order for your provider to thoroughly review all the results before contacting the office for clarification of your results.    You have been scheduled for an abdominal ultrasound at [facility] (1st floor of hospital) on Thursday, 11/16/23 at 8:30 am. Please arrive 15 minutes prior to your appointment for registration. Make certain not to have anything to eat or drink after midnight prior to your appointment. Should you need to reschedule your appointment, please contact radiology at [phone]. This test typically takes about 30 minutes to perform.   VISIT SUMMARY:  Today, we discussed your ongoing abdominal pain, which has been persistent for about a year. We reviewed your recent diagnostic tests, including a CT scan and chest x-ray, and discussed your current medications. We also addressed your gastroesophageal reflux disease (GERD), constipation, and recent anemia.  YOUR PLAN:  -CHRONIC ABDOMINAL PAIN: Your abdominal pain may be related to muscle or nerve issues, as recent tests did not show any acute problems. We will use Salonpas patches on your ribcage area for pain relief and have ordered an ultrasound to check your gallbladder. Additionally, we are increasing your pantoprazole  dosage to twice daily for one month.  -GASTROESOPHAGEAL REFLUX DISEASE (GERD):  GERD is a condition where stomach acid frequently flows back into the tube connecting your mouth and stomach. To help manage this, we are increasing your pantoprazole  dosage to twice daily for one month.  -CONSTIPATION: Constipation can be caused by factors like decreased mobility, diet, and medications. Despite daily bowel movements, your CT scan suggests you may still be constipated. We have ordered an abdominal x-ray to further assess this.  -ANEMIA, UNSPECIFIED: Anemia is a condition where you don't have enough healthy red blood cells to carry adequate oxygen to your body's tissues. We have noted recent anemia and will check your iron and ferritin levels to see if you have iron deficiency anemia.  INSTRUCTIONS:  Please follow the new medication regimen and use the Salonpas patches as directed. We will schedule an ultrasound for your gallbladder and an abdominal x-ray to assess for constipation. Additionally, we will conduct blood tests to check your iron and ferritin levels. Follow up with us  after these tests are completed.   Thank you for trusting me with your gastrointestinal care!   Alan Coombs, PA-C  _______________________________________________________  If your blood pressure at your visit was 140/90 or greater, please contact your primary care physician to follow up on this.  _______________________________________________________  If you are age 12 or older, your body mass index should be between 23-30. Your There is no height or weight on file to calculate BMI. If this is out of the aforementioned range listed, please consider follow up with your Primary Care Provider.  If you are age 27 or younger, your body mass index should be  between 19-25. Your There is no height or weight on file to calculate BMI. If this is out of the aformentioned range listed, please consider follow up with your Primary Care Provider.    ________________________________________________________  The [facility] providers would like to encourage you to use MYCHART to communicate with providers for non-urgent requests or questions.  Due to long hold times on the telephone, sending your provider a message by [facility] may be a faster and more efficient way to get a response.  Please allow 48 business hours for a response.  Please remember that this is for non-urgent requests.  _______________________________________________________  [facility] is using a team-based approach to care.  Your team is made up of your doctor and two to three APPS. Our APPS (Nurse Practitioners and Physician Assistants) work with your physician to ensure care continuity for you. They are fully qualified to address your health concerns and develop a treatment plan. They communicate directly with your gastroenterologist to care for you. Seeing the Advanced Practice Practitioners on your physician's team can help you by facilitating care more promptly, often allowing for earlier appointments, access to diagnostic testing, procedures, and other specialty referrals.

## 2023-11-08 DIAGNOSIS — R1013 Epigastric pain: Secondary | ICD-10-CM | POA: Diagnosis not present

## 2023-11-08 DIAGNOSIS — N400 Enlarged prostate without lower urinary tract symptoms: Secondary | ICD-10-CM | POA: Diagnosis not present

## 2023-11-08 DIAGNOSIS — K760 Fatty (change of) liver, not elsewhere classified: Secondary | ICD-10-CM | POA: Diagnosis not present

## 2023-11-08 DIAGNOSIS — D649 Anemia, unspecified: Secondary | ICD-10-CM | POA: Diagnosis not present

## 2023-11-08 DIAGNOSIS — F03C2 Unspecified dementia, severe, with psychotic disturbance: Secondary | ICD-10-CM | POA: Diagnosis not present

## 2023-11-08 DIAGNOSIS — M6281 Muscle weakness (generalized): Secondary | ICD-10-CM | POA: Diagnosis not present

## 2023-11-16 ENCOUNTER — Ambulatory Visit (HOSPITAL_COMMUNITY)
Admission: RE | Admit: 2023-11-16 | Discharge: 2023-11-16 | Disposition: A | Discharge status: Home / Self Care | Source: Ambulatory Visit | Attending: Physician Assistant | Admitting: Physician Assistant

## 2023-11-16 DIAGNOSIS — D649 Anemia, unspecified: Secondary | ICD-10-CM | POA: Diagnosis not present

## 2023-11-16 DIAGNOSIS — R1013 Epigastric pain: Secondary | ICD-10-CM | POA: Diagnosis not present

## 2023-11-16 DIAGNOSIS — R110 Nausea: Secondary | ICD-10-CM | POA: Diagnosis not present

## 2023-11-16 DIAGNOSIS — R109 Unspecified abdominal pain: Secondary | ICD-10-CM | POA: Diagnosis not present

## 2023-11-22 NOTE — Telephone Encounter (Signed)
 Called and spoke with patient's wife [name] regarding US  results. [name] was not sure if the facility picked up any Salonpas patches but she will check with them today. [name] verbalized understanding of results and recommendations. No concerns at the end of the call.

## 2023-11-23 ENCOUNTER — Telehealth: Payer: Self-pay | Admitting: Physician Assistant

## 2023-11-23 NOTE — Telephone Encounter (Signed)
 Pt.s daughter now legal guardian needs letter of diagnosis for father/Pt ASAP Court dates are pending

## 2023-11-24 ENCOUNTER — Encounter: Payer: Self-pay | Admitting: Physician Assistant

## 2023-11-24 NOTE — Progress Notes (Signed)
    Dear [name] or Madam:  This is to inform you that [name]  has been under my care in the Neurology clinic since 03/01/2021 for Dementia with behavioral disturbance and  Primary progressive aphasia PPA    Cognitive decline is noted essentially nonverbal, mumbling some words, confusing family members.  He also has some mobility issues, forgetting how to ambulate, needing his wheelchair more frequently.  He needs assistance with most ADLs.  He is no longer on antidementia medications (memantine  and donepezil), as these are no longer therapeutic and the risk of then may outweigh the benefit of them.   He needs 24/7 surveillance care As a result of examining this patient over these years, it is my medical opinion that lacks the capacity to act prudently or effectively with regard to his  financial affairs.  This patient's medical condition is highly unlikely to improve over time.    Respectfully, Camie Sevin, PA-C

## 2023-11-26 ENCOUNTER — Emergency Department (HOSPITAL_COMMUNITY)
Admission: EM | Admit: 2023-11-26 | Discharge: 2023-11-26 | Disposition: A | Discharge status: Skilled Nursing Facility | Attending: Emergency Medicine | Admitting: Emergency Medicine

## 2023-11-26 ENCOUNTER — Emergency Department (HOSPITAL_COMMUNITY)

## 2023-11-26 ENCOUNTER — Encounter (HOSPITAL_COMMUNITY): Payer: Self-pay

## 2023-11-26 DIAGNOSIS — S01111A Laceration without foreign body of right eyelid and periocular area, initial encounter: Secondary | ICD-10-CM | POA: Diagnosis not present

## 2023-11-26 DIAGNOSIS — Z23 Encounter for immunization: Secondary | ICD-10-CM | POA: Insufficient documentation

## 2023-11-26 DIAGNOSIS — G9389 Other specified disorders of brain: Secondary | ICD-10-CM | POA: Diagnosis not present

## 2023-11-26 DIAGNOSIS — W010XXA Fall on same level from slipping, tripping and stumbling without subsequent striking against object, initial encounter: Secondary | ICD-10-CM | POA: Diagnosis not present

## 2023-11-26 DIAGNOSIS — W19XXXA Unspecified fall, initial encounter: Secondary | ICD-10-CM

## 2023-11-26 DIAGNOSIS — F0390 Unspecified dementia without behavioral disturbance: Secondary | ICD-10-CM | POA: Insufficient documentation

## 2023-11-26 DIAGNOSIS — I672 Cerebral atherosclerosis: Secondary | ICD-10-CM | POA: Diagnosis not present

## 2023-11-26 DIAGNOSIS — S0990XA Unspecified injury of head, initial encounter: Secondary | ICD-10-CM | POA: Diagnosis not present

## 2023-11-26 DIAGNOSIS — Z7401 Bed confinement status: Secondary | ICD-10-CM | POA: Diagnosis not present

## 2023-11-26 MED ORDER — TETANUS-DIPHTH-ACELL PERTUSSIS 5-2-15.5 LF-MCG/0.5 IM SUSP
0.5000 mL | Freq: Once | INTRAMUSCULAR | Status: AC
Start: 2023-11-26 — End: 2023-11-26
  Administered 2023-11-26: 0.5 mL via INTRAMUSCULAR
  Filled 2023-11-26: qty 0.5

## 2023-11-26 MED ORDER — INFLUENZA VAC SPLIT HIGH-DOSE 0.5 ML IM SUSY
0.5000 mL | PREFILLED_SYRINGE | INTRAMUSCULAR | Status: AC
  Administered 2023-11-26: 0.5 mL via INTRAMUSCULAR
  Filled 2023-11-26: qty 0.5

## 2023-11-26 NOTE — ED Notes (Signed)
 [facility]AR eta fourth in line

## 2023-11-26 NOTE — ED Provider Notes (Signed)
 [place] EMERGENCY DEPARTMENT AT [facility] Provider Note   CSN: [id number] Arrival date & time: 11/26/23  1410     Patient presents with: [name] is a 68 y.o. male.   Patient is a 68 year old male with a history of dementia, hypercholesterolemia, aphasia who lives at a long-term care facility is being brought in today by ambulance due to a fall.  Staff reports that they were trying to get him up when he slipped and fell and got a small skin tear over his right eyebrow.  They are not sure if his head hit the floor or what caused the injury.  They deny any loss of consciousness and report the patient is at his baseline.  He takes no anticoagulation.  On exam here patient denies having neck pain.  He has no complaints.  Unclear tetanus status  The history is provided by the EMS personnel, the nursing home and medical records.  Fall       Prior to Admission medications   Medication Sig Start Date End Date Taking? Authorizing Provider  acetaminophen  (TYLENOL ) 325 MG tablet Take 650 mg by mouth daily as needed for mild pain (pain score 1-3).    [provider]  escitalopram (LEXAPRO) 10 MG tablet Take 10 mg by mouth daily. 01/16/23   [provider]  olmesartan (BENICAR) 20 MG tablet Take 20 mg by mouth daily. 11/28/22   [provider]  ondansetron  (ZOFRAN -ODT) 4 MG disintegrating tablet Take 1 tablet (4 mg total) by mouth every 8 (eight) hours as needed for vomiting. 05/10/23   Mesner, Selinda, MD  pantoprazole  (PROTONIX ) 40 MG tablet Take 1 tablet (40 mg total) by mouth daily. 05/10/23   Mesner, Selinda, MD  QUEtiapine  (SEROQUEL ) 25 MG tablet TAKE 1 TABLET BY MOUTH EVERYDAY AT BEDTIME 04/19/23   Wertman, Sara E, PA-C  rosuvastatin  (CRESTOR ) 10 MG tablet Take 10 mg by mouth daily. 01/12/23   [provider]  tamsulosin  (FLOMAX ) 0.4 MG CAPS capsule Take 0.4 mg by mouth at bedtime.    [provider]    Allergies:  Patient has no known allergies.    Review of Systems  Updated Vital Signs BP (!) 145/80   Pulse 83   Temp 98.8 F (37.1 C) (Oral)   Resp 12   SpO2 99%   Physical Exam Vitals and nursing note reviewed.  Constitutional:      General: He is not in acute distress.    Appearance: He is well-developed.  HENT:     Head: Normocephalic and atraumatic.      Comments: Pupils are equal and reactive. Eyes:     Conjunctiva/sclera: Conjunctivae normal.     Pupils: Pupils are equal, round, and reactive to light.  Cardiovascular:     Rate and Rhythm: Normal rate and regular rhythm.     Heart sounds: No murmur heard. Pulmonary:     Effort: Pulmonary effort is normal. No respiratory distress.     Breath sounds: Normal breath sounds. No wheezing or rales.  Abdominal:     General: There is no distension.     Palpations: Abdomen is soft.     Tenderness: There is no abdominal tenderness. There is no guarding or rebound.  Musculoskeletal:        General: No tenderness. Normal range of motion.     Cervical back: Normal range of motion and neck supple.     Right lower leg: No edema.  Left lower leg: No edema.  Skin:    General: Skin is warm and dry.     Findings: No erythema or rash.  Neurological:     Mental Status: He is alert. Mental status is at baseline.     Comments: Awake and alert  Psychiatric:     Comments: Cooperative     (all labs ordered are listed, but only abnormal results are displayed) Labs Reviewed - No data to display  EKG: None  Radiology: CT Head Wo Contrast Result Date: 11/26/2023 CLINICAL DATA:  Head trauma, minor (Age >= 65y) EXAM: CT HEAD WITHOUT CONTRAST TECHNIQUE: Contiguous axial images were obtained from the base of the skull through the vertex without intravenous contrast. RADIATION DOSE REDUCTION: This exam was performed according to the departmental dose-optimization program which includes automated exposure control, adjustment of the mA and/or kV  according to patient size and/or use of iterative reconstruction technique. COMPARISON:  05/10/2023 FINDINGS: Brain: No acute intracranial hemorrhage. No subdural or extra-axial collection. Stable degree of atrophy, with a temporal lobe predominance. Ventricular dilatation is likely due to central atrophy, and not changed from prior exam. Stable chronic small vessel ischemic change. No evidence of acute ischemia. No midline shift. Vascular: Atherosclerosis of skullbase vasculature without hyperdense vessel or abnormal calcification. Skull: No fracture or focal lesion. Sinuses/Orbits: No acute finding. Other: None. IMPRESSION: 1. No acute intracranial abnormality. No skull fracture. 2. Stable atrophy and chronic small vessel ischemic change. Electronically Signed   By: Andrea Gasman M.D.   On: 11/26/2023 15:32     Procedures  LACERATION REPAIR Performed by: [facility] Authorized by: Benton Shone Consent: Verbal consent obtained. Risks and benefits: risks, benefits and alternatives were discussed Consent given by: patient Patient identity confirmed: provided demographic data Prepped and Draped in normal sterile fashion Wound explored  Laceration Location: right eyebrow  Laceration Length: 2cm  No Foreign Bodies seen or palpated  Anesthesia: local infiltration  Local anesthetic: lidocaine  2% epi epinephrine  Anesthetic total: 5 ml  Irrigation method: syringe Amount of cleaning: standard  Skin closure: dermabond   Patient tolerance: Patient tolerated the procedure well with no immediate complications.  Medications Ordered in the ED  Influenza vac split trivalent PF (FLUZONE HIGH-DOSE) injection 0.5 mL (has no administration in time range)  Tdap (ADACEL) injection 0.5 mL (0.5 mLs Intramuscular Given 11/26/23 1548)                                    Medical Decision Making Amount and/or Complexity of Data Reviewed Radiology: ordered and independent interpretation  performed. Decision-making details documented in ED Course.  Risk Prescription drug management.   Patient presenting today after a witnessed fall because he slipped.  Small cut on the right eyebrow but no loss of consciousness.  Tetanus shot was updated.  Wound repaired with Dermabond.  I have independently visualized and interpreted pt's images today. Head CT without evidence of intracranial bleed today.  Radiology reports chronic atrophy but no acute findings.  All this was discussed with his wife who is present at bedside and patient is stable for discharge back to his facility.     Final diagnoses:  Fall, initial encounter  Laceration of right eyebrow, initial encounter    ED Discharge Orders     None          Shone Benton, MD 11/26/23 1550

## 2023-11-26 NOTE — Discharge Instructions (Signed)
 The Dermabond will peel off when it is ready.  You can shower and the area can get wet just do not scrub it.  The CAT scan was normal today.

## 2023-11-26 NOTE — ED Triage Notes (Signed)
 Pt BIB [facility] from [facility] from memory care. Pt slipped while the staff was trying to get him up. Pt is demented. Small skin tear noted on R eyebrow area. VSS.   120/76 80 hr  17 rr 99% RA

## 2023-11-29 DIAGNOSIS — I10 Essential (primary) hypertension: Secondary | ICD-10-CM | POA: Diagnosis not present

## 2023-11-29 DIAGNOSIS — R2681 Unsteadiness on feet: Secondary | ICD-10-CM | POA: Diagnosis not present

## 2023-11-29 DIAGNOSIS — S0083XA Contusion of other part of head, initial encounter: Secondary | ICD-10-CM | POA: Diagnosis not present

## 2023-11-29 DIAGNOSIS — W1830XA Fall on same level, unspecified, initial encounter: Secondary | ICD-10-CM | POA: Diagnosis not present

## 2023-11-29 DIAGNOSIS — M6281 Muscle weakness (generalized): Secondary | ICD-10-CM | POA: Diagnosis not present

## 2023-11-29 DIAGNOSIS — S0081XA Abrasion of other part of head, initial encounter: Secondary | ICD-10-CM | POA: Diagnosis not present

## 2023-11-29 DIAGNOSIS — R296 Repeated falls: Secondary | ICD-10-CM | POA: Diagnosis not present

## 2023-12-25 ENCOUNTER — Encounter (HOSPITAL_COMMUNITY)

## 2023-12-25 ENCOUNTER — Ambulatory Visit

## 2024-01-03 DIAGNOSIS — R1313 Dysphagia, pharyngeal phase: Secondary | ICD-10-CM | POA: Diagnosis not present

## 2024-01-03 DIAGNOSIS — R1312 Dysphagia, oropharyngeal phase: Secondary | ICD-10-CM | POA: Diagnosis not present

## 2024-01-05 DIAGNOSIS — R1312 Dysphagia, oropharyngeal phase: Secondary | ICD-10-CM | POA: Diagnosis not present

## 2024-01-10 NOTE — Progress Notes (Deleted)
 01/10/2024 [name] [id number] [birth year]  Referring provider: [facility] Primary GI doctor: Dr. Charlanne  ASSESSMENT AND PLAN:  Abdominal pain per wife, states he will jump if you touch his AB x 1 year Has had weight loss, rare vomiting no diarrhea, unable to move Appears to be more RUQ or epigastirc colonoscopy about 2 years ago at facility, possible [facility], will try to get records Minimally elevated lipase 10/30/2023 10/30/2023 CT AP W unremarkable which is reassuring 11/16/2023 ABUS normal -Possible MSK with palpation to flank/side, will try salon pas patches -suggest getting KUB to evaluate for constipation -Consider increase pantoprazole  40 mg twice a day for 1 month and then back to once a day for possible gastritis -Consider gastroparesis diet/GES study - follow up 2-3 months  normocytic anemia Hemoccult negative in ER 10/30/2023 11/07/2023  HGB 12.8 MCV 87.4 Platelets 241.0 11/07/2023 Iron 62 Ferritin 285.9 B12 550 Recent Labs    01/27/23 1442 05/10/23 0053 10/30/23 2110 11/07/23 1215  HGB 14.0 15.0 12.5* 12.8*  Iron ferritin normal, very slightly decreased percentage Consider repeat iron ferritin  Fatty liver seen on AB Us  12/2020    Latest Ref Rng & Units 11/07/2023   12:15 PM 10/30/2023    9:10 PM 05/10/2023   12:53 AM  Hepatic Function  Total Protein 6.0 - 8.3 g/dL 7.4  7.0  7.5   Albumin 3.5 - 5.2 g/dL 4.4  4.3  4.2   AST 0 - 37 U/L 21  20  27    ALT 0 - 53 U/L 24  30  27    Alk Phosphatase 39 - 117 U/L 98  111  95   Total Bilirubin 0.2 - 1.2 mg/dL 0.4  0.3  0.8    Platelets 241  11/16/2023 unremarkable ABUS  Dementia with hemiplegia Comes from facility, wife is with him Unable to stand, in wheelchair [address], need to try to get information from them  Personal history of colon polyps Colonoscopy 10/19/2022 at [facility] endoscopy with Dr. Dianna for screening purposes showed adequate good bowel prep 10 mm polyp cecum 2 mm polyp  sigmoid colon diverticula ascending descending sigmoid colon large internal hemorrhoids sigmoid colon TA polyp, cecum hyperplastic states no planned repeat colonoscopy due to neurological comorbid conditions   Patient Care Team: [facility] as PCP - General Georjean Darice HERO, MD as Consulting Physician (Neurology)  HISTORY OF PRESENT ILLNESS: 68 y.o. male with a past medical history listed below presents for evaluation of AB pain.   I last saw the patient in the office 11/07/2023  Discussed the use of AI scribe software for clinical note transcription with the patient, who gave verbal consent to proceed.  History of Present Illness            He  reports that he has never smoked. He has never used smokeless tobacco. He reports that he does not currently use alcohol. He reports that he does not use drugs.  RELEVANT GI HISTORY, IMAGING AND LABS: Results          CBC    Component Value Date/Time   WBC 7.6 11/07/2023 1215   RBC 4.39 11/07/2023 1215   HGB 12.8 (L) 11/07/2023 1215   HCT 38.4 (L) 11/07/2023 1215   PLT 241.0 11/07/2023 1215   MCV 87.4 11/07/2023 1215   MCH 28.8 10/30/2023 2110   MCHC 33.3 11/07/2023 1215   RDW 14.6 11/07/2023 1215   LYMPHSABS 2.9 11/07/2023 1215   MONOABS 0.5 11/07/2023 1215  EOSABS 0.2 11/07/2023 1215   BASOSABS 0.0 11/07/2023 1215   Recent Labs    01/27/23 1442 05/10/23 0053 10/30/23 2110 11/07/23 1215  HGB 14.0 15.0 12.5* 12.8*    CMP     Component Value Date/Time   NA 137 11/07/2023 1215   K 3.5 11/07/2023 1215   CL 106 11/07/2023 1215   CO2 28 11/07/2023 1215   GLUCOSE 81 11/07/2023 1215   BUN 12 11/07/2023 1215   CREATININE 0.77 11/07/2023 1215   CALCIUM  9.6 11/07/2023 1215   PROT 7.4 11/07/2023 1215   ALBUMIN 4.4 11/07/2023 1215   AST 21 11/07/2023 1215   ALT 24 11/07/2023 1215   ALKPHOS 98 11/07/2023 1215   BILITOT 0.4 11/07/2023 1215   GFRNONAA >60 10/30/2023 2110   GFRAA >60 02/06/2018 0645       Latest Ref Rng & Units 11/07/2023   12:15 PM 10/30/2023    9:10 PM 05/10/2023   12:53 AM  Hepatic Function  Total Protein 6.0 - 8.3 g/dL 7.4  7.0  7.5   Albumin 3.5 - 5.2 g/dL 4.4  4.3  4.2   AST 0 - 37 U/L 21  20  27    ALT 0 - 53 U/L 24  30  27    Alk Phosphatase 39 - 117 U/L 98  111  95   Total Bilirubin 0.2 - 1.2 mg/dL 0.4  0.3  0.8       Current Medications:    Current Outpatient Medications (Cardiovascular):    olmesartan (BENICAR) 20 MG tablet, Take 20 mg by mouth daily.   rosuvastatin  (CRESTOR ) 10 MG tablet, Take 10 mg by mouth daily.   Current Outpatient Medications (Analgesics):    acetaminophen  (TYLENOL ) 325 MG tablet, Take 650 mg by mouth daily as needed for mild pain (pain score 1-3).   Current Outpatient Medications (Other):    escitalopram (LEXAPRO) 10 MG tablet, Take 10 mg by mouth daily.   ondansetron  (ZOFRAN -ODT) 4 MG disintegrating tablet, Take 1 tablet (4 mg total) by mouth every 8 (eight) hours as needed for vomiting.   pantoprazole  (PROTONIX ) 40 MG tablet, Take 1 tablet (40 mg total) by mouth daily.   QUEtiapine  (SEROQUEL ) 25 MG tablet, TAKE 1 TABLET BY MOUTH EVERYDAY AT BEDTIME   tamsulosin  (FLOMAX ) 0.4 MG CAPS capsule, Take 0.4 mg by mouth at bedtime.  Medical History:  Past Medical History:  Diagnosis Date   Benign prostatic hyperplasia without lower urinary tract symptoms 02/05/2018   Dementia (HCC)    Fatty liver    Hardening of the aorta (main artery of the heart)    Insomnia    Major neurocognitive disorder 03/24/2021   Male erectile dysfunction 12/21/2019   Nocturia more than twice per night    Pain in right hip    Personality disorder    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   Semantic vs logopenic   Pure hypercholesterolemia    Renal cyst    Shoulder joint pain, right    Wears glasses    Allergies: No Known Allergies   Surgical History:  He  has a past surgical history that includes Tonsillectomy (age 34); Transurethral resection of  prostate (N/A, 02/05/2018); and Shoulder surgery. Family History:  His family history includes Dementia in his father.  REVIEW OF SYSTEMS  : All other systems reviewed and negative except where noted in the History of Present Illness.  PHYSICAL EXAM: There were no vitals taken for this visit. Physical Exam   GENERAL APPEARANCE: in wheelchair, in  no apparent distress. HEENT: Head is normocephalic and atraumatic. No cervical lymphadenopathy, unremarkable thyroid, sclerae anicteric, conjunctiva pink. RESPIRATORY: Coarse breath sounds at the bases. Respiratory effort normal, breath sounds equal bilateral without rales, rhonchi, wheezing. CARDIO: Regular rhythm with no murmurs, rubs, or gallops. Peripheral pulses intact. ABDOMEN: Soft, non-distended, active bowel sounds in all 4 quadrants, tenderness at right ribcage on palpation and RUQ/epigastric AB, no rebound tenderness, no mass appreciated RECTAL: Declines. MUSCULOSKELETAL: some spasticity left arm, hemiplegia right side, patient in wheelchair, unable to stand  SKIN: Dry, intact without rashes or lesions. No jaundice. NEURO: Alert, able to respond to pain, otherwise not verbal, not oriented PSYCH: Cooperative, normal mood and affect.      Alan JONELLE Coombs, PA-C 12:22 PM

## 2024-01-11 ENCOUNTER — Ambulatory Visit: Admitting: Physician Assistant

## 2024-01-11 DIAGNOSIS — R1312 Dysphagia, oropharyngeal phase: Secondary | ICD-10-CM | POA: Diagnosis not present

## 2024-01-16 DIAGNOSIS — R1313 Dysphagia, pharyngeal phase: Secondary | ICD-10-CM | POA: Diagnosis not present

## 2024-01-16 DIAGNOSIS — R1312 Dysphagia, oropharyngeal phase: Secondary | ICD-10-CM | POA: Diagnosis not present

## 2024-01-24 DIAGNOSIS — R609 Edema, unspecified: Secondary | ICD-10-CM | POA: Diagnosis not present

## 2024-01-24 DIAGNOSIS — I10 Essential (primary) hypertension: Secondary | ICD-10-CM | POA: Diagnosis not present

## 2024-01-24 DIAGNOSIS — I739 Peripheral vascular disease, unspecified: Secondary | ICD-10-CM | POA: Diagnosis not present

## 2024-01-24 DIAGNOSIS — F03918 Unspecified dementia, unspecified severity, with other behavioral disturbance: Secondary | ICD-10-CM | POA: Diagnosis not present

## 2024-01-24 DIAGNOSIS — R159 Full incontinence of feces: Secondary | ICD-10-CM | POA: Diagnosis not present

## 2024-01-24 DIAGNOSIS — R32 Unspecified urinary incontinence: Secondary | ICD-10-CM | POA: Diagnosis not present

## 2024-01-24 DIAGNOSIS — N400 Enlarged prostate without lower urinary tract symptoms: Secondary | ICD-10-CM | POA: Diagnosis not present
